# Patient Record
Sex: Male | Born: 1935 | Race: White | Hispanic: No | Smoking: Former smoker
Health system: Southern US, Community
[De-identification: ages and names within clinical notes are randomized; demographics above are authoritative.]

## PROBLEM LIST (undated history)

## (undated) DIAGNOSIS — M1612 Unilateral primary osteoarthritis, left hip: Secondary | ICD-10-CM

## (undated) DIAGNOSIS — L570 Actinic keratosis: Secondary | ICD-10-CM

## (undated) DIAGNOSIS — I480 Paroxysmal atrial fibrillation: Secondary | ICD-10-CM

## (undated) DIAGNOSIS — I313 Pericardial effusion (noninflammatory): Secondary | ICD-10-CM

## (undated) DIAGNOSIS — E785 Hyperlipidemia, unspecified: Secondary | ICD-10-CM

## (undated) DIAGNOSIS — I10 Essential (primary) hypertension: Secondary | ICD-10-CM

## (undated) DIAGNOSIS — R42 Dizziness and giddiness: Secondary | ICD-10-CM

## (undated) DIAGNOSIS — R918 Other nonspecific abnormal finding of lung field: Secondary | ICD-10-CM

## (undated) DIAGNOSIS — I77810 Thoracic aortic ectasia: Secondary | ICD-10-CM

## (undated) DIAGNOSIS — H409 Unspecified glaucoma: Secondary | ICD-10-CM

## (undated) DIAGNOSIS — J189 Pneumonia, unspecified organism: Secondary | ICD-10-CM

## (undated) DIAGNOSIS — N400 Enlarged prostate without lower urinary tract symptoms: Secondary | ICD-10-CM

## (undated) DIAGNOSIS — Z86010 Personal history of colonic polyps: Secondary | ICD-10-CM

## (undated) DIAGNOSIS — H269 Unspecified cataract: Secondary | ICD-10-CM

## (undated) DIAGNOSIS — Z860101 Personal history of adenomatous and serrated colon polyps: Secondary | ICD-10-CM

## (undated) DIAGNOSIS — M169 Osteoarthritis of hip, unspecified: Secondary | ICD-10-CM

## (undated) DIAGNOSIS — I350 Nonrheumatic aortic (valve) stenosis: Secondary | ICD-10-CM

## (undated) DIAGNOSIS — R202 Paresthesia of skin: Secondary | ICD-10-CM

## (undated) DIAGNOSIS — I341 Nonrheumatic mitral (valve) prolapse: Secondary | ICD-10-CM

## (undated) DIAGNOSIS — J309 Allergic rhinitis, unspecified: Secondary | ICD-10-CM

## (undated) DIAGNOSIS — I272 Pulmonary hypertension, unspecified: Secondary | ICD-10-CM

## (undated) DIAGNOSIS — Z889 Allergy status to unspecified drugs, medicaments and biological substances status: Secondary | ICD-10-CM

## (undated) DIAGNOSIS — N323 Diverticulum of bladder: Secondary | ICD-10-CM

## (undated) DIAGNOSIS — I5032 Chronic diastolic (congestive) heart failure: Secondary | ICD-10-CM

## (undated) DIAGNOSIS — R55 Syncope and collapse: Secondary | ICD-10-CM

## (undated) DIAGNOSIS — I2510 Atherosclerotic heart disease of native coronary artery without angina pectoris: Secondary | ICD-10-CM

## (undated) HISTORY — DX: Actinic keratosis: L57.0

## (undated) HISTORY — DX: Pericardial effusion (noninflammatory): I31.3

## (undated) HISTORY — DX: Allergy status to unspecified drugs, medicaments and biological substances: Z88.9

## (undated) HISTORY — DX: Unspecified glaucoma: H40.9

## (undated) HISTORY — DX: Pneumonia, unspecified organism: J18.9

## (undated) HISTORY — DX: Paresthesia of skin: R20.2

## (undated) HISTORY — DX: Unspecified cataract: H26.9

## (undated) HISTORY — DX: Pulmonary hypertension, unspecified: I27.20

## (undated) HISTORY — PX: COLONOSCOPY: SHX174

## (undated) HISTORY — DX: Personal history of adenomatous and serrated colon polyps: Z86.0101

## (undated) HISTORY — DX: Benign prostatic hyperplasia without lower urinary tract symptoms: N40.0

## (undated) HISTORY — DX: Chronic diastolic (congestive) heart failure: I50.32

## (undated) HISTORY — DX: Other nonspecific abnormal finding of lung field: R91.8

## (undated) HISTORY — DX: Syncope and collapse: R55

## (undated) HISTORY — DX: Unilateral primary osteoarthritis, left hip: M16.12

## (undated) HISTORY — DX: Personal history of colonic polyps: Z86.010

## (undated) HISTORY — DX: Nonrheumatic mitral (valve) prolapse: I34.1

## (undated) HISTORY — DX: Osteoarthritis of hip, unspecified: M16.9

## (undated) HISTORY — DX: Atherosclerotic heart disease of native coronary artery without angina pectoris: I25.10

## (undated) HISTORY — DX: Essential (primary) hypertension: I10

## (undated) HISTORY — DX: Dizziness and giddiness: R42

## (undated) HISTORY — DX: Thoracic aortic ectasia: I77.810

## (undated) HISTORY — DX: Nonrheumatic aortic (valve) stenosis: I35.0

## (undated) HISTORY — DX: Paroxysmal atrial fibrillation: I48.0

## (undated) HISTORY — DX: Allergic rhinitis, unspecified: J30.9

## (undated) HISTORY — DX: Hyperlipidemia, unspecified: E78.5

## (undated) HISTORY — PX: EYE SURGERY: SHX253

## (undated) HISTORY — DX: Diverticulum of bladder: N32.3

---
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
## 1999-06-13 ENCOUNTER — Encounter (INDEPENDENT_AMBULATORY_CARE_PROVIDER_SITE_OTHER): Payer: Self-pay

## 1999-06-13 ENCOUNTER — Other Ambulatory Visit: Admission: RE | Admit: 1999-06-13 | Discharge: 1999-06-13 | Payer: Self-pay | Admitting: Gastroenterology

## 2002-10-20 ENCOUNTER — Encounter: Payer: Self-pay | Admitting: Emergency Medicine

## 2002-10-20 ENCOUNTER — Emergency Department (HOSPITAL_COMMUNITY): Admission: EM | Admit: 2002-10-20 | Discharge: 2002-10-20 | Payer: Self-pay | Admitting: Emergency Medicine

## 2006-07-04 ENCOUNTER — Ambulatory Visit: Payer: Self-pay | Admitting: Internal Medicine

## 2006-08-06 ENCOUNTER — Ambulatory Visit: Payer: Self-pay | Admitting: Internal Medicine

## 2006-08-06 ENCOUNTER — Encounter: Payer: Self-pay | Admitting: Internal Medicine

## 2006-08-07 ENCOUNTER — Ambulatory Visit: Payer: Self-pay | Admitting: Internal Medicine

## 2006-08-07 LAB — CONVERTED CEMR LAB
Basophils Absolute: 0 10*3/uL (ref 0.0–0.1)
Basophils Relative: 0.1 % (ref 0.0–1.0)
Eosinophils Absolute: 0.5 10*3/uL (ref 0.0–0.6)
Eosinophils Relative: 7.2 % — ABNORMAL HIGH (ref 0.0–5.0)
HCT: 46.7 % (ref 39.0–52.0)
Hemoglobin: 16.1 g/dL (ref 13.0–17.0)
Lymphocytes Relative: 22.8 % (ref 12.0–46.0)
MCHC: 34.5 g/dL (ref 30.0–36.0)
MCV: 92.9 fL (ref 78.0–100.0)
Monocytes Absolute: 0.7 10*3/uL (ref 0.2–0.7)
Monocytes Relative: 10.3 % (ref 3.0–11.0)
Neutro Abs: 4.1 10*3/uL (ref 1.4–7.7)
Neutrophils Relative %: 59.6 % (ref 43.0–77.0)
Platelets: 183 10*3/uL (ref 150–400)
RBC: 5.02 M/uL (ref 4.22–5.81)
RDW: 12.9 % (ref 11.5–14.6)
WBC: 6.9 10*3/uL (ref 4.5–10.5)

## 2007-05-12 DIAGNOSIS — E785 Hyperlipidemia, unspecified: Secondary | ICD-10-CM

## 2007-05-12 DIAGNOSIS — K921 Melena: Secondary | ICD-10-CM

## 2007-05-12 DIAGNOSIS — K648 Other hemorrhoids: Secondary | ICD-10-CM | POA: Insufficient documentation

## 2007-05-12 DIAGNOSIS — Z86010 Personal history of colonic polyps: Secondary | ICD-10-CM

## 2010-05-29 NOTE — Assessment & Plan Note (Signed)
[name]                         GASTROENTEROLOGY OFFICE NOTE   NAME:[name] [name] [name] [name]                      MRN:          [id number]  DATE:07/04/2006                            DOB:          12-04-1935    CHIEF COMPLAINT:  Heme-positive stool.   ASSESSMENT:  1. Recent Hemoccult-positive stools.  2. Personal history of colon polyps.  Last colonoscopy with      adenomatous polyps in 2001.  Also had adenomatous polyps in 1999.      Previously seen by Dr. Corinda Gubler.   RECOMMENDATIONS/PLAN:  Proceed with colonoscopy to investigate the heme-  positive stools plus follow up colon polyps.  The risks, benefits and  indications were reviewed, and he understands and agrees to proceed.   The patient has no active symptoms.  He recently has what sounds like a  gastroenteritis with nausea and vomiting and diarrhea, which was self-  limited.  He was in systole at the time.   MEDICATIONS:  1. Pravastatin 40 mg daily.  2. Aspirin 325 mg daily.  3. Flonase p.r.n.   DRUG ALLERGIES:  None known.   PAST MEDICAL HISTORY:  1. Allergies.  2. Dyslipidemia.  3. Colon polyps (adenomatous).   SOCIAL HISTORY:  He is a Nurse, mental health, Ph.D, at [employer].  Married.  No children.  Lives with his wife.  Some alcohol.  No tobacco  or drugs.   FAMILY HISTORY:  Father had pancreatic cancer.   REVIEW OF SYSTEMS:  Otherwise negative.   PHYSICAL EXAMINATION:  GENERAL:  A well-developed and well-nourished  white man.  VITAL SIGNS:  Height 5 feet 9.  Weight 161 pounds.  Blood pressure  128/60, pulse 64.  HEENT:  Eyes are anicteric.  Normal mouth, lips, and pharynx.  NECK:  Supple.  No thyromegaly or mass.  CHEST:  Clear.  HEART:  S1 and S2.  No murmurs, rubs or gallops.  ABDOMEN:  Soft and nontender without organomegaly or mass.  EXTREMITIES:  No edema.  He is alert and oriented x3.   I appreciate the opportunity to care for this patient.     [name] Boop,  MD,FACG  Electronically Signed    CEG/MedQ  DD: 07/04/2006  DT: 07/05/2006  Job #: [id number]   cc:   [name] Maduro A. Nicholos [name], M.D.

## 2011-06-06 ENCOUNTER — Encounter: Payer: Self-pay | Admitting: Internal Medicine

## 2011-06-07 ENCOUNTER — Encounter: Payer: Self-pay | Admitting: Internal Medicine

## 2011-06-21 ENCOUNTER — Encounter: Payer: Self-pay | Admitting: Internal Medicine

## 2011-07-15 ENCOUNTER — Encounter: Payer: Self-pay | Admitting: Gastroenterology

## 2011-07-15 ENCOUNTER — Ambulatory Visit (AMBULATORY_SURGERY_CENTER): Payer: BC Managed Care – PPO | Admitting: *Deleted

## 2011-07-15 VITALS — Ht 69.0 in | Wt 150.7 lb

## 2011-07-15 DIAGNOSIS — Z1211 Encounter for screening for malignant neoplasm of colon: Secondary | ICD-10-CM

## 2011-07-15 MED ORDER — MOVIPREP 100 G PO SOLR: ORAL | Status: DC

## 2011-07-31 ENCOUNTER — Encounter: Payer: Self-pay | Admitting: Internal Medicine

## 2011-07-31 ENCOUNTER — Ambulatory Visit (AMBULATORY_SURGERY_CENTER): Payer: BC Managed Care – PPO | Admitting: Internal Medicine

## 2011-07-31 VITALS — BP 147/74 | HR 61 | Temp 97.9°F | Resp 18 | Ht 69.0 in | Wt 150.0 lb

## 2011-07-31 DIAGNOSIS — D126 Benign neoplasm of colon, unspecified: Secondary | ICD-10-CM

## 2011-07-31 DIAGNOSIS — K5730 Diverticulosis of large intestine without perforation or abscess without bleeding: Secondary | ICD-10-CM

## 2011-07-31 DIAGNOSIS — K648 Other hemorrhoids: Secondary | ICD-10-CM

## 2011-07-31 DIAGNOSIS — Z1211 Encounter for screening for malignant neoplasm of colon: Secondary | ICD-10-CM

## 2011-07-31 DIAGNOSIS — Z86010 Personal history of colonic polyps: Secondary | ICD-10-CM

## 2011-07-31 DIAGNOSIS — R011 Cardiac murmur, unspecified: Secondary | ICD-10-CM

## 2011-07-31 MED ORDER — SODIUM CHLORIDE 0.9 % IV SOLN: 500.0000 mL | INTRAVENOUS | Status: DC

## 2011-07-31 NOTE — Progress Notes (Signed)
Patient did not experience any of the following events: a burn prior to discharge; a fall within the facility; wrong site/side/patient/procedure/implant event; or a hospital transfer or hospital admission upon discharge from the facility. (G8907) Patient did not have preoperative order for IV antibiotic SSI prophylaxis. (G8918)  

## 2011-07-31 NOTE — Op Note (Signed)
[facility] [address] [place], [place]  [address]  COLONOSCOPY PROCEDURE REPORT  PATIENT:  [name], [name]  MR#:  [id number] BIRTHDATE:  [birth year], 75 yrs. old  GENDER:  male ENDOSCOPIST:  Iva Boop, MD, [facility]  PROCEDURE DATE:  07/31/2011 PROCEDURE:  Colonoscopy with snare polypectomy ASA CLASS:  Class II INDICATIONS:  surveillance and high-risk screening, history of pre-cancerous (adenomatous) colon polyps adenomas 1999, 2001 and 2008 (3 diminutive polyps) MEDICATIONS:   These medications were titrated to patient response per physician's verbal order, MAC sedation, administered by CRNA, propofol (Diprivan) 170 mg IV  DESCRIPTION OF PROCEDURE:   After the risks benefits and alternatives of the procedure were thoroughly explained, informed consent was obtained.  Digital rectal exam was performed and revealed no abnormalities and normal prostate.   The LB [id number]-H180AL [id number] endoscope was introduced through the anus and advanced to the cecum, which was identified by both the appendix and ileocecal valve, without limitations.  The quality of the prep was excellent, using MoviPrep.  The instrument was then slowly withdrawn as the colon was fully examined. <<PROCEDUREIMAGES>>  FINDINGS:  There were multiple polyps identified and removed. throughout the colon. Seven (7) polyps removed, cecum (1), ascending (3), transverse (2) and splenic flexure (1). Largest was 8-10 mm.  Moderate diverticulosis was found in the sigmoid colon. This was otherwise a normal examination of the colon. Includes right colon retroflexion.   Retroflexed views in the rectum revealed internal hemorrhoids.    The time to cecum = 2:26 minutes. The scope was then withdrawn in 18:03 minutes from the cecum and the procedure completed. COMPLICATIONS:  None ENDOSCOPIC IMPRESSION: 1) Polyps, multiple throughout the colon 2) Moderate diverticulosis in the sigmoid colon 3) Internal hemorrhoids 4)  Otherwise normal examination RECOMMENDATIONS: 1) Make an appointment with primary care MD (Dr. Nicholos Johns) to follow-up on heart murmur heard today. REPEAT EXAM:  In for Colonoscopy, pending biopsy results. likely 3 years  Iva Boop, MD, [facility]  CC:  Elias Else, MD and The Patient  n. eSIGNED:   Iva Boop at 07/31/2011 11:23 AM  [place] Mink, [phone]

## 2011-07-31 NOTE — Progress Notes (Addendum)
Colonoscopy report and information placed into a sealed envelope and given to pt at DC. Discharge information given to care partner and then care partner sent back to lobby

## 2011-07-31 NOTE — Patient Instructions (Addendum)
Seven (7) polyps were removed today. They look benign. You will probably need a repeat colonoscopy in 3 years. I will notify you after I see the pathology report.  You also have diverticulosis and internal hemorrhoids which are not usually a problem.  I also heard a heart murmur and recommend that you make an appointment with dr. Nicholos Johns to have that evaluated by him.  Thank you for choosing me and [facility].  Iva Boop, MD, FACG   YOU HAD AN ENDOSCOPIC PROCEDURE TODAY AT THE [facility]: Refer to the procedure report that was given to you for any specific questions about what was found during the examination.  If the procedure report does not answer your questions, please call your gastroenterologist to clarify.  If you requested that your care partner not be given the details of your procedure findings, then the procedure report has been included in a sealed envelope for you to review at your convenience later.  YOU SHOULD EXPECT: Some feelings of bloating in the abdomen. Passage of more gas than usual.  Walking can help get rid of the air that was put into your GI tract during the procedure and reduce the bloating. If you had a lower endoscopy (such as a colonoscopy or flexible sigmoidoscopy) you may notice spotting of blood in your stool or on the toilet paper. If you underwent a bowel prep for your procedure, then you may not have a normal bowel movement for a few days.  DIET: Your first meal following the procedure should be a light meal and then it is ok to progress to your normal diet.  A half-sandwich or bowl of soup is an example of a good first meal.  Heavy or fried foods are harder to digest and may make you feel nauseous or bloated.  Likewise meals heavy in dairy and vegetables can cause extra gas to form and this can also increase the bloating.  Drink plenty of fluids but you should avoid alcoholic beverages for 24 hours.  ACTIVITY: Your care partner  should take you home directly after the procedure.  You should plan to take it easy, moving slowly for the rest of the day.  You can resume normal activity the day after the procedure however you should NOT DRIVE or use heavy machinery for 24 hours (because of the sedation medicines used during the test).    SYMPTOMS TO REPORT IMMEDIATELY: A gastroenterologist can be reached at any hour.  During normal business hours, 8:30 AM to 5:00 PM Monday through Friday, call [phone].  After hours and on weekends, please call the GI answering service at [phone] who will take a message and have the physician on call contact you.   Following lower endoscopy (colonoscopy or flexible sigmoidoscopy):  Excessive amounts of blood in the stool  Significant tenderness or worsening of abdominal pains  Swelling of the abdomen that is new, acute  Fever of 100F or higher  FOLLOW UP: If any biopsies were taken you will be contacted by phone or by letter within the next 1-3 weeks.  Call your gastroenterologist if you have not heard about the biopsies in 3 weeks.  Our staff will call the home number listed on your records the next business day following your procedure to check on you and address any questions or concerns that you may have at that time regarding the information given to you following your procedure. This is a courtesy call and so if there is  no answer at the home number and we have not heard from you through the emergency physician on call, we will assume that you have returned to your regular daily activities without incident.  SIGNATURES/CONFIDENTIALITY: You and/or your care partner have signed paperwork which will be entered into your electronic medical record.  These signatures attest to the fact that that the information above on your After Visit Summary has been reviewed and is understood.  Full responsibility of the confidentiality of this discharge information lies with you and/or your  care-partner.

## 2011-07-31 NOTE — Progress Notes (Addendum)
popofol per [facility] crna, all meds to be titrated per crna during procedure. See scanned intra procedure report. ewm

## 2011-08-01 ENCOUNTER — Telehealth: Payer: Self-pay

## 2011-08-01 NOTE — Telephone Encounter (Signed)
Not available

## 2011-08-06 ENCOUNTER — Encounter: Payer: Self-pay | Admitting: Internal Medicine

## 2011-08-06 NOTE — Progress Notes (Signed)
Quick Note:  7 polyps largest 10 mm and 5 adenomas others hyperplastic  Repeat colon 3 years 07/2014 ______

## 2013-08-31 ENCOUNTER — Telehealth: Payer: Self-pay | Admitting: Cardiology

## 2013-08-31 ENCOUNTER — Other Ambulatory Visit: Payer: Self-pay | Admitting: General Surgery

## 2013-08-31 ENCOUNTER — Ambulatory Visit (HOSPITAL_COMMUNITY): Payer: BC Managed Care – PPO | Attending: Cardiology | Admitting: Radiology

## 2013-08-31 DIAGNOSIS — I77810 Thoracic aortic ectasia: Secondary | ICD-10-CM

## 2013-08-31 DIAGNOSIS — I359 Nonrheumatic aortic valve disorder, unspecified: Secondary | ICD-10-CM | POA: Diagnosis present

## 2013-08-31 DIAGNOSIS — I080 Rheumatic disorders of both mitral and aortic valves: Secondary | ICD-10-CM | POA: Insufficient documentation

## 2013-08-31 DIAGNOSIS — I350 Nonrheumatic aortic (valve) stenosis: Secondary | ICD-10-CM

## 2013-08-31 NOTE — Telephone Encounter (Signed)
Pt is aware. Made aware he will be new pt. Pt is waiting for scheduling to call him to reschedule.

## 2013-08-31 NOTE — Progress Notes (Signed)
Echocardiogram performed.  

## 2013-08-31 NOTE — Telephone Encounter (Signed)
Please have patient set up for a new patient visit to follow for abnormal echo with moderate AS and dilated aortic root

## 2013-09-01 ENCOUNTER — Encounter (HOSPITAL_COMMUNITY): Payer: Self-pay | Admitting: Cardiology

## 2013-09-09 ENCOUNTER — Encounter: Payer: Self-pay | Admitting: Cardiology

## 2013-09-09 ENCOUNTER — Encounter: Payer: Self-pay | Admitting: General Surgery

## 2013-09-09 ENCOUNTER — Ambulatory Visit (INDEPENDENT_AMBULATORY_CARE_PROVIDER_SITE_OTHER): Payer: BC Managed Care – PPO | Admitting: Cardiology

## 2013-09-09 VITALS — BP 134/82 | HR 59 | Ht 69.0 in | Wt 148.1 lb

## 2013-09-09 DIAGNOSIS — I359 Nonrheumatic aortic valve disorder, unspecified: Secondary | ICD-10-CM

## 2013-09-09 DIAGNOSIS — I059 Rheumatic mitral valve disease, unspecified: Secondary | ICD-10-CM

## 2013-09-09 DIAGNOSIS — I77810 Thoracic aortic ectasia: Secondary | ICD-10-CM

## 2013-09-09 DIAGNOSIS — Z8249 Family history of ischemic heart disease and other diseases of the circulatory system: Secondary | ICD-10-CM

## 2013-09-09 DIAGNOSIS — I350 Nonrheumatic aortic (valve) stenosis: Secondary | ICD-10-CM

## 2013-09-09 DIAGNOSIS — I341 Nonrheumatic mitral (valve) prolapse: Secondary | ICD-10-CM | POA: Insufficient documentation

## 2013-09-09 DIAGNOSIS — Q2381 Bicuspid aortic valve: Secondary | ICD-10-CM | POA: Insufficient documentation

## 2013-09-09 DIAGNOSIS — Q231 Congenital insufficiency of aortic valve: Secondary | ICD-10-CM | POA: Insufficient documentation

## 2013-09-09 NOTE — Patient Instructions (Addendum)
Your physician recommends that you continue on your current medications as directed. Please refer to the Current Medication list given to you today.  Your physician has requested that you have an exercise tolerance test. For further information please visit [email or web address]. Please also follow instruction sheet, as given.   Your physician wants you to follow-up in:12 months with Dr Mallie Snooks will receive a reminder letter in the mail two months in advance. If you don't receive a letter, please call our office to schedule the follow-up appointment.

## 2013-09-09 NOTE — Progress Notes (Signed)
[address] [place], [place]  [address] Phone: [phone] Fax:  [phone]  Date:  09/09/2013   ID:  [name] [name], DOB 08-15-1935, MRN [id number]  PCP:  [name], MD  Cardiologist:  Fransico Him, MD     History of Present Illness: [name] [name] is a 78 y.o. male with a history of dyslipidemia and family history of CAD with a brother who died at age 29 of an MI who presents today for evaluation of abnormal echo.  He has been followed with serial echos for bicuspid aortic valve with AS and mildly dilated aortic root.  His last echo showed normal LVF with moderate AS, mildly dilated aortic root at 72mm and mild MVP.  He is dong well.  He denies any chest pain, SOB, DOE (except when hiking up mountains), LE edema, dizziness, palpitations or syncope.   Wt Readings from Last 3 Encounters:  09/09/13 148 lb 1.9 oz (67.187 kg)  07/31/11 150 lb (68.04 kg)  07/15/11 150 lb 11.2 oz (68.357 kg)     Past Medical History  Diagnosis Date  . H/O seasonal allergies   . Bilateral cataracts   . Glaucoma   . Hyperlipidemia   . Allergic rhinitis   . Actinic keratoses     Dr Glee Arvin  . BPH (benign prostatic hypertrophy)   . Paresthesia     Chronic left lateral thigh  . Aortic valve stenosis, moderate   . Bicuspid aortic valve   . Dilated aortic root   . Mitral valve prolapse     Current Outpatient Prescriptions  Medication Sig Dispense Refill  . Ascorbic Acid (VITAMIN C) 1000 MG tablet Take 1,000 mg by mouth daily.      Marland Kitchen aspirin 81 MG tablet Take 81 mg by mouth daily.      . diphenhydrAMINE (SOMINEX) 25 MG tablet Take 25 mg by mouth at bedtime as needed.      . fish oil-omega-3 fatty acids 1000 MG capsule Take 1 g by mouth every other day.      . latanoprost (XALATAN) 0.005 % ophthalmic solution Place 1 drop into both eyes at bedtime.      . pravastatin (PRAVACHOL) 40 MG tablet Take 40 mg by mouth daily.       . timolol (BETIMOL) 0.25 % ophthalmic  solution 1-2 drops daily.       No current facility-administered medications for this visit.    Allergies:   No Known Allergies  Social History:  The patient  reports that he quit smoking about 53 years ago. He has never used smokeless tobacco. He reports that he drinks about 3 ounces of alcohol per week. He reports that he does not use illicit drugs.   Family History:  The patient's family history includes Emphysema in his mother; Heart attack in his brother; Heart disease in his brother; Pancreatic cancer in his father. There is no history of Colon cancer or Stomach cancer.   ROS:  Please see the history of present illness.      All other systems reviewed and negative.   PHYSICAL EXAM: VS:  BP 134/82  Pulse 59  Ht 5\' 9"  (1.753 m)  Wt 148 lb 1.9 oz (67.187 kg)  BMI 21.86 kg/m2  SpO2 99% Well nourished, well developed, in no acute distress HEENT: normal Neck: no JVD Cardiac:  normal S1, S2; RRR; 1/6 SM at RUSB, mid systolic click at apex with 2/6 SM Lungs:  clear to auscultation  bilaterally, no wheezing, rhonchi or rales Abd: soft, nontender, no hepatomegaly Ext: no edema Skin: warm and dry Neuro:  CNs 2-12 intact, no focal abnormalities noted   ASSESSMENT AND PLAN:  1. Bicuspid AV with moderate AS which is asymptomatic 2. Mild MVP with trivial MR 3. Mildly dilated aortic root 4. Family history of CAD with MI - ETT to rule out ischemia  Followup with me in 1 year  Signed, Fransico Him, MD 09/09/2013 11:50 AM

## 2013-09-11 ENCOUNTER — Encounter: Payer: Self-pay | Admitting: Cardiology

## 2013-10-14 ENCOUNTER — Ambulatory Visit (INDEPENDENT_AMBULATORY_CARE_PROVIDER_SITE_OTHER): Payer: BC Managed Care – PPO | Admitting: Physician Assistant

## 2013-10-14 DIAGNOSIS — R9439 Abnormal result of other cardiovascular function study: Secondary | ICD-10-CM

## 2013-10-14 DIAGNOSIS — Z8249 Family history of ischemic heart disease and other diseases of the circulatory system: Secondary | ICD-10-CM

## 2013-10-14 NOTE — Progress Notes (Signed)
Exercise Treadmill Test  Pre-Exercise Testing Evaluation Rhythm: normal sinus  Rate: 57 bpm     Test  Exercise Tolerance Test Ordering MD: Fransico Him, MD  Interpreting MD: Richardson Dopp, PA-C  Unique Test No: 1  Treadmill:  1  Indication for ETT: Family History of early CAD  Contraindication to ETT: No   Stress Modality: exercise - treadmill  Cardiac Imaging Performed: non   Protocol: standard Bruce - maximal  Max BP:  2220/109  Max MPHR (bpm):  145 85% MPR (bpm):  123  MPHR obtained (bpm):  106 % MPHR obtained:  73  Reached 85% MPHR (min:sec):  n/a Total Exercise Time (min-sec):  6:37  Workload in METS:  7.9 Borg Scale: 18  Reason ETT Terminated:  exaggerated hypertensive response/fatigue    ST Segment Analysis At Rest: non-specific ST segment slurring With Exercise: non-specific ST changes  Other Information Arrhythmia:  No Angina during ETT:  absent (0) Quality of ETT:  non-diagnostic  ETT Interpretation:  No Significant ST depression at Submaximal Exercise.  Exaggerated Hypertensive BP Response.    Comments: Fair exercise capacity. No chest pain. Exaggerated Hypertensive BP response to exercise (147/83>>>220/109). No significant ST changes to suggest ischemia with submaximal exercise.   Recommendations: With FHx, decreased exercise tolerance, exaggerated BP response >>> Will get [employer]. Signed,  Richardson Dopp, PA-C   10/14/2013 9:56 AM

## 2013-10-20 ENCOUNTER — Encounter: Payer: Self-pay | Admitting: Cardiology

## 2013-10-21 ENCOUNTER — Ambulatory Visit (HOSPITAL_COMMUNITY): Payer: BC Managed Care – PPO | Attending: Cardiology | Admitting: Radiology

## 2013-10-21 VITALS — BP 182/77 | HR 51 | Ht 69.0 in | Wt 149.0 lb

## 2013-10-21 DIAGNOSIS — Z8249 Family history of ischemic heart disease and other diseases of the circulatory system: Secondary | ICD-10-CM

## 2013-10-21 DIAGNOSIS — R9439 Abnormal result of other cardiovascular function study: Secondary | ICD-10-CM | POA: Diagnosis not present

## 2013-10-21 MED ORDER — REGADENOSON 0.4 MG/5ML IV SOLN
0.4000 mg | Freq: Once | INTRAVENOUS | Status: AC
  Administered 2013-10-21: 0.4 mg via INTRAVENOUS

## 2013-10-21 MED ORDER — TECHNETIUM TC 99M SESTAMIBI GENERIC - CARDIOLITE
30.0000 | Freq: Once | INTRAVENOUS | Status: AC | PRN
  Administered 2013-10-21: 30 via INTRAVENOUS

## 2013-10-21 MED ORDER — TECHNETIUM TC 99M SESTAMIBI GENERIC - CARDIOLITE
10.0000 | Freq: Once | INTRAVENOUS | Status: AC | PRN
  Administered 2013-10-21: 10 via INTRAVENOUS

## 2013-10-21 NOTE — Progress Notes (Signed)
[facility] [address] [place], [place] [address] [phone]    Cardiology Nuclear Med Study  IMRI LOR is a 78 y.o. male     MRN : [id number]     DOB: Jun 01, 1935  Procedure Date: 10/21/2013  Nuclear Med Background Indication for Stress Test:  Evaluation for Ischemia and Abnormal GXT History:  MPI ~90's Cardiac Risk Factors: N/A  Symptoms:  None indicated   Nuclear Pre-Procedure Caffeine/Decaff Intake:  None NPO After: 8:00pm   Lungs:  clear O2 Sat: 93% on room air. IV 0.9% NS with Angio Cath:  22g  IV Site: R Hand  IV Started by:  Crissie Figures, RN  Chest Size (in):  42 Cup Size: n/a  Height: 5\' 9"  (1.753 m)  Weight:  149 lb (67.586 kg)  BMI:  Body mass index is 21.99 kg/(m^2). Tech Comments:  N/A    Nuclear Med Study 1 or 2 day study: 1 day  Stress Test Type:  Lexiscan  Reading MD: N/A  Order Authorizing Provider:  Fransico Him, MD  Resting Radionuclide: Technetium 40m Sestamibi  Resting Radionuclide Dose: 11.0 mCi   Stress Radionuclide:  Technetium 33m Sestamibi  Stress Radionuclide Dose: 33.0 mCi           Stress Protocol Rest HR: 51 Stress HR: 91  Rest BP: 182/77 Stress BP: 129/68  Exercise Time (min): n/a METS: n/a           Dose of Adenosine (mg):  n/a Dose of Lexiscan: 0.4 mg  Dose of Atropine (mg): n/a Dose of Dobutamine: n/a mcg/kg/min (at max HR)  Stress Test Technologist: Glade Lloyd, BS-ES  Nuclear Technologist:  Margie Ege     Rest Procedure:  Myocardial perfusion imaging was performed at rest 45 minutes following the intravenous administration of Technetium 84m Sestamibi. Rest ECG: NSR - Normal EKG  Stress Procedure:  The patient received IV Lexiscan 0.4 mg over 15-seconds with concurrent low level exercise and then Technetium 69m Sestamibi was injected at 30-seconds while the patient continued walking one more minute.  Quantitative spect images were obtained after a 45-minute delay.  During the  infusion of Lexiscan the patient complained of fatigue, chest tightness and lightheadedness.  These symptoms slowly began to resolve in recovery.  Stress ECG: No significant change from baseline ECG  QPS Raw Data Images:  Normal; no motion artifact; normal heart/lung ratio. Stress Images:  Normal homogeneous uptake in all areas of the myocardium. Rest Images:  Normal homogeneous uptake in all areas of the myocardium. Subtraction (SDS):  No evidence of ischemia. Transient Ischemic Dilatation (Normal <1.22):  1.02 Lung/Heart Ratio (Normal <0.45):  0.27  Quantitative Gated Spect Images QGS EDV:  102 ml QGS ESV:  33 ml  Impression Exercise Capacity:  Lexiscan with low level exercise. BP Response:  Normal blood pressure response. Clinical Symptoms:  No significant symptoms noted. ECG Impression:  No significant ST segment change suggestive of ischemia. Comparison with Prior Nuclear Study: No images to compare  Overall Impression:  Normal stress nuclear study.  LV Ejection Fraction: 67%.  LV Wall Motion:  NL LV Function; NL Wall Motion.   Thayer Headings, Brooke Bonito., MD, [facility] 10/21/2013, 3:09 PM [address],  [place] Pager [phone]

## 2013-10-22 ENCOUNTER — Encounter: Payer: Self-pay | Admitting: Physician Assistant

## 2014-05-03 ENCOUNTER — Ambulatory Visit
Admission: RE | Admit: 2014-05-03 | Discharge: 2014-05-03 | Disposition: A | Discharge status: Home / Self Care | Payer: BC Managed Care – PPO | Source: Ambulatory Visit | Attending: Family Medicine | Admitting: Family Medicine

## 2014-05-03 ENCOUNTER — Other Ambulatory Visit: Payer: Self-pay | Admitting: Family Medicine

## 2014-05-03 DIAGNOSIS — M25552 Pain in left hip: Secondary | ICD-10-CM

## 2014-06-16 ENCOUNTER — Encounter: Payer: Self-pay | Admitting: Cardiology

## 2014-06-30 ENCOUNTER — Encounter: Payer: Self-pay | Admitting: Internal Medicine

## 2014-08-23 ENCOUNTER — Ambulatory Visit (AMBULATORY_SURGERY_CENTER): Payer: Self-pay

## 2014-08-23 VITALS — Ht 69.5 in | Wt 152.0 lb

## 2014-08-23 DIAGNOSIS — Z860100 Personal history of colon polyps, unspecified: Secondary | ICD-10-CM

## 2014-08-23 NOTE — Progress Notes (Signed)
No allergies to eggs or soy No diet/weight loss meds No home oxygen No past problems with anesthesia  Refused Emmi 

## 2014-08-30 ENCOUNTER — Encounter: Payer: Self-pay | Admitting: Internal Medicine

## 2014-08-30 ENCOUNTER — Ambulatory Visit (AMBULATORY_SURGERY_CENTER): Payer: BC Managed Care – PPO | Admitting: Internal Medicine

## 2014-08-30 VITALS — BP 136/66 | HR 58 | Temp 96.4°F | Resp 39 | Ht 69.0 in | Wt 152.0 lb

## 2014-08-30 DIAGNOSIS — D122 Benign neoplasm of ascending colon: Secondary | ICD-10-CM

## 2014-08-30 DIAGNOSIS — Z86010 Personal history of colonic polyps: Secondary | ICD-10-CM | POA: Diagnosis not present

## 2014-08-30 DIAGNOSIS — D124 Benign neoplasm of descending colon: Secondary | ICD-10-CM

## 2014-08-30 MED ORDER — SODIUM CHLORIDE 0.9 % IV SOLN: 500.0000 mL | INTRAVENOUS | Status: DC

## 2014-08-30 NOTE — Patient Instructions (Addendum)
I found and removed 3 small polyps that look benign. I will let you know pathology results and when/if to have another routine colonoscopy by mail.  I appreciate the opportunity to care for you. Gatha Mayer, MD, FACG  YOU HAD AN ENDOSCOPIC PROCEDURE TODAY AT [facility]:   Refer to the procedure report that was given to you for any specific questions about what was found during the examination.  If the procedure report does not answer your questions, please call your gastroenterologist to clarify.  If you requested that your care partner not be given the details of your procedure findings, then the procedure report has been included in a sealed envelope for you to review at your convenience later.  YOU SHOULD EXPECT: Some feelings of bloating in the abdomen. Passage of more gas than usual.  Walking can help get rid of the air that was put into your GI tract during the procedure and reduce the bloating. If you had a lower endoscopy (such as a colonoscopy or flexible sigmoidoscopy) you may notice spotting of blood in your stool or on the toilet paper. If you underwent a bowel prep for your procedure, you may not have a normal bowel movement for a few days.  Please Note:  You might notice some irritation and congestion in your nose or some drainage.  This is from the oxygen used during your procedure.  There is no need for concern and it should clear up in a day or so.  SYMPTOMS TO REPORT IMMEDIATELY:   Following lower endoscopy (colonoscopy or flexible sigmoidoscopy):  Excessive amounts of blood in the stool  Significant tenderness or worsening of abdominal pains  Swelling of the abdomen that is new, acute  Fever of 100F or higher   For urgent or emergent issues, a gastroenterologist can be reached at any hour by calling [phone].   DIET: Your first meal following the procedure should be a small meal and then it is ok to progress to your normal diet. Heavy  or fried foods are harder to digest and may make you feel nauseous or bloated.  Likewise, meals heavy in dairy and vegetables can increase bloating.  Drink plenty of fluids but you should avoid alcoholic beverages for 24 hours.  ACTIVITY:  You should plan to take it easy for the rest of today and you should NOT DRIVE or use heavy machinery until tomorrow (because of the sedation medicines used during the test).    FOLLOW UP: Our staff will call the number listed on your records the next business day following your procedure to check on you and address any questions or concerns that you may have regarding the information given to you following your procedure. If we do not reach you, we will leave a message.  However, if you are feeling well and you are not experiencing any problems, there is no need to return our call.  We will assume that you have returned to your regular daily activities without incident.  If any biopsies were taken you will be contacted by phone or by letter within the next 1-3 weeks.  Please call us at [phone] if you have not heard about the biopsies in 3 weeks.    SIGNATURES/CONFIDENTIALITY: You and/or your care partner have signed paperwork which will be entered into your electronic medical record.  These signatures attest to the fact that that the information above on your After Visit Summary has been reviewed and is  understood.  Full responsibility of the confidentiality of this discharge information lies with you and/or your care-partner.  Polyp information given.

## 2014-08-30 NOTE — Op Note (Signed)
[place]  [address] [place], [id number]   COLONOSCOPY PROCEDURE REPORT  PATIENT: [name] [name], [name] [name]  MR#: [id number] BIRTHDATE: [birth year] , 71  yrs. old GENDER: male ENDOSCOPIST: Gatha Mayer, MD, [facility] PROCEDURE DATE:  08/30/2014 PROCEDURE:   Colonoscopy, surveillance and Colonoscopy with snare polypectomy First Screening Colonoscopy - Avg.  risk and is 50 yrs.  old or older - No.  Prior Negative Screening - Now for repeat screening. N/A  History of Adenoma - Now for follow-up colonoscopy & has been > or = to 3 yrs.  Yes hx of adenoma.  Has been 3 or more years since last colonoscopy.  Polyps removed today? Yes ASA CLASS:   Class I INDICATIONS:Surveillance due to prior colonic neoplasia and PH Colon Adenoma. MEDICATIONS: Propofol 175 mg IV and Monitored anesthesia care  DESCRIPTION OF PROCEDURE:   After the risks benefits and alternatives of the procedure were thoroughly explained, informed consent was obtained.  The digital rectal exam revealed no abnormalities of the rectum and revealed no prostatic nodules. The [id number]  endoscope was introduced through the anus and advanced to the cecum, which was identified by both the appendix and ileocecal valve. No adverse events experienced.   The quality of the prep was good.  (MiraLax was used)  The instrument was then slowly withdrawn as the colon was fully examined. Estimated blood loss is zero unless otherwise noted in this procedure report.  COLON FINDINGS: Three sessile polyps ranging from 3 to 39mm in size were found in the ascending colon and descending colon. Polypectomies were performed with a cold snare.  The resection was complete, the polyp tissue was completely retrieved and sent to histology.   There was diverticulosis noted in the sigmoid colon. The examination was otherwise normal.  Retroflexed views revealed no abnormalities. The time to cecum = 3.4 Withdrawal time = 14.0 The scope  was withdrawn and the procedure completed. COMPLICATIONS: There were no immediate complications.  ENDOSCOPIC IMPRESSION: 1.   Three sessile polyps ranging from 3 to 21mm in size were found in the ascending colon and descending colon; polypectomies were performed with a cold snare 2.   Diverticulosis was noted in the sigmoid colon 3.   The examination was otherwise normal - good prep  RECOMMENDATIONS: Timing of repeat colonoscopy will be determined by pathology findings.  he has hx adenomas over the years but age may preclude furher routine colonoscopy.  eSigned:  Gatha Mayer, MD, [facility] 08/30/2014 2:39 PM   cc: Dr. Maury Dus and The Patient

## 2014-08-30 NOTE — Progress Notes (Signed)
Transferred to recovery room. A/O x3, pleased with MAC.  VSS.  Report to Jane, RN. 

## 2014-08-30 NOTE — Progress Notes (Signed)
Called to room to assist during endoscopic procedure.  Patient ID and intended procedure confirmed with present staff. Received instructions for my participation in the procedure from the performing physician.  

## 2014-08-31 ENCOUNTER — Telehealth: Payer: Self-pay | Admitting: *Deleted

## 2014-08-31 NOTE — Telephone Encounter (Signed)
  No answer at home or cell #.  Left message on cell phone voicemail.

## 2014-09-06 ENCOUNTER — Encounter: Payer: Self-pay | Admitting: Internal Medicine

## 2014-09-06 DIAGNOSIS — Z86010 Personal history of colonic polyps: Secondary | ICD-10-CM

## 2014-09-06 NOTE — Progress Notes (Signed)
Quick Note:  1 diminutive adenoma - would not repeat routine colonoscopy due to age ______

## 2014-09-12 ENCOUNTER — Other Ambulatory Visit: Payer: Self-pay | Admitting: Cardiology

## 2014-09-12 DIAGNOSIS — I77810 Thoracic aortic ectasia: Secondary | ICD-10-CM

## 2014-09-13 ENCOUNTER — Other Ambulatory Visit: Payer: Self-pay

## 2014-09-13 ENCOUNTER — Ambulatory Visit (HOSPITAL_COMMUNITY): Payer: BC Managed Care – PPO | Attending: Cardiology

## 2014-09-13 DIAGNOSIS — I272 Other secondary pulmonary hypertension: Secondary | ICD-10-CM | POA: Insufficient documentation

## 2014-09-13 DIAGNOSIS — I77810 Thoracic aortic ectasia: Secondary | ICD-10-CM | POA: Diagnosis not present

## 2014-09-13 DIAGNOSIS — I071 Rheumatic tricuspid insufficiency: Secondary | ICD-10-CM | POA: Diagnosis not present

## 2014-09-13 DIAGNOSIS — I313 Pericardial effusion (noninflammatory): Secondary | ICD-10-CM | POA: Insufficient documentation

## 2014-09-13 DIAGNOSIS — E785 Hyperlipidemia, unspecified: Secondary | ICD-10-CM | POA: Insufficient documentation

## 2014-09-13 DIAGNOSIS — I352 Nonrheumatic aortic (valve) stenosis with insufficiency: Secondary | ICD-10-CM | POA: Diagnosis not present

## 2014-09-13 DIAGNOSIS — Z8249 Family history of ischemic heart disease and other diseases of the circulatory system: Secondary | ICD-10-CM | POA: Diagnosis not present

## 2014-09-14 ENCOUNTER — Telehealth: Payer: Self-pay | Admitting: Cardiology

## 2014-09-14 ENCOUNTER — Telehealth: Payer: Self-pay

## 2014-09-14 DIAGNOSIS — I341 Nonrheumatic mitral (valve) prolapse: Secondary | ICD-10-CM

## 2014-09-14 DIAGNOSIS — Q231 Congenital insufficiency of aortic valve: Secondary | ICD-10-CM

## 2014-09-14 DIAGNOSIS — I350 Nonrheumatic aortic (valve) stenosis: Secondary | ICD-10-CM

## 2014-09-14 DIAGNOSIS — I77810 Thoracic aortic ectasia: Secondary | ICD-10-CM

## 2014-09-14 NOTE — Telephone Encounter (Signed)
Encouraged patient to keep appointment since he has known cardiac issues.  Patient agrees with treatment plan.

## 2014-09-14 NOTE — Telephone Encounter (Signed)
Informed patient of results and verbal understanding expressed.  Repeat ECHO ordered to be scheduled in 1 year. Patient agrees with treatment plan. 

## 2014-09-14 NOTE — Telephone Encounter (Signed)
New Message  Pt requested to speak w/ RN about upcoming appt- pt stated that per previous conversation w/ Valetta Fuller- pt condition is stable. Pt did not know if this appt on 9/6 was necessary. Please call back and discuss.

## 2014-09-14 NOTE — Telephone Encounter (Signed)
-----   Message from Sueanne Margarita, MD sent at 09/13/2014 10:57 AM EDT ----- Please let patient know that echo showed low normal LVF with bicuspid AV and mild to moderate AS, moderate TR - no significant change from a year ago - repeat echo in 1 year.

## 2014-09-19 NOTE — Progress Notes (Signed)
Cardiology Office Note   Date:  09/20/2014   ID:  [name] [name], DOB [birth year], MRN [id number]  PCP:  [name], MD    Chief Complaint  Patient presents with  . Bicuspid aortic valve      History of Present Illness: [name] [name] is a 79 y.o. male with a history of dyslipidemia and family history of CAD, bicuspid aortic valve with moderate AS and mildly dilated aortic root and mild MVP. He is dong well. He denies any chest pain, SOB, DOE (except when hiking up mountains), LE edema, dizziness, palpitations or syncope.    Past Medical History  Diagnosis Date  . H/O seasonal allergies   . Bilateral cataracts   . Glaucoma   . Hyperlipidemia   . Allergic rhinitis   . Actinic keratoses     Dr Glee Arvin  . BPH (benign prostatic hypertrophy)   . Paresthesia     Chronic left lateral thigh  . Aortic valve stenosis, moderate   . Bicuspid aortic valve   . Dilated aortic root   . Mitral valve prolapse   . Hx of cardiovascular stress test     Myoview (10/15):  normal  . Hx of adenomatous colonic polyps 1991 on    Past Surgical History  Procedure Laterality Date  . Tonsillectomy  1949  . Colonoscopy  multiple     Current Outpatient Prescriptions  Medication Sig Dispense Refill  . Ascorbic Acid (VITAMIN C) 1000 MG tablet Take 1,000 mg by mouth daily.    Marland Kitchen aspirin 81 MG tablet Take 81 mg by mouth daily.    . fish oil-omega-3 fatty acids 1000 MG capsule Take 1 g by mouth daily.     Marland Kitchen latanoprost (XALATAN) 0.005 % ophthalmic solution Place 1 drop into both eyes at bedtime.    . pravastatin (PRAVACHOL) 40 MG tablet Take 40 mg by mouth daily.     . timolol (BETIMOL) 0.25 % ophthalmic solution 1-2 drops daily.     No current facility-administered medications for this visit.    Allergies:   Review of patient's allergies indicates no known allergies.    Social History:  The patient  reports that he quit smoking about 54 years ago. He  has never used smokeless tobacco. He reports that he drinks about 3.0 oz of alcohol per week. He reports that he does not use illicit drugs.   Family History:  The patient's family history includes Emphysema in his mother; Heart attack in his brother; Heart disease in his brother; Pancreatic cancer in his father. There is no history of Colon cancer or Stomach cancer.    ROS:  Please see the history of present illness.   Otherwise, review of systems are positive for none.   All other systems are reviewed and negative.    PHYSICAL EXAM: VS:  BP 104/68 mmHg  Pulse 61  Ht 5\' 9"  (1.753 m)  Wt 148 lb 12.8 oz (67.495 kg)  BMI 21.96 kg/m2 , BMI Body mass index is 21.96 kg/(m^2). GEN: Well nourished, well developed, in no acute distress HEENT: normal Neck: no JVD, carotid bruits, or masses Cardiac: RRR; no rubs, or gallops,no edema.  1/6 SM at RUSB Respiratory:  clear to auscultation bilaterally, normal work of breathing GI: soft, nontender, nondistended, + BS MS: no deformity or atrophy Skin: warm and dry, no rash Neuro:  Strength and sensation are intact Psych:  euthymic mood, full affect   EKG:  EKG was ordered today and showed NSR with LAE and LAFB    Recent Labs: No results found for requested labs within last 365 days.    Lipid Panel No results found for: CHOL, TRIG, HDL, CHOLHDL, VLDL, LDLCALC, LDLDIRECT    Wt Readings from Last 3 Encounters:  09/20/14 148 lb 12.8 oz (67.495 kg)  08/30/14 152 lb (68.947 kg)  08/23/14 152 lb (68.947 kg)     ASSESSMENT AND PLAN:  1. Bicuspid AV with mild to moderate AS by echo 08/2014 which is asymptomatic- repeat echo in 1 year 2. Mild MVP with trivial MR 3. Mildly dilated aortic root 4. Family history of CAD with MI    Current medicines are reviewed at length with the patient today.  The patient does not have concerns regarding medicines.  The following changes have been made:  no change  Labs/ tests ordered today: See above  Assessment and Plan No orders of the defined types were placed in this encounter.     Disposition:   FU with me in 1 year  Signed, Sueanne Margarita, MD  09/20/2014 3:17 PM    [facility] [place], [place], [place]  [address] Phone: [phone]; Fax: [phone]

## 2014-09-20 ENCOUNTER — Encounter: Payer: Self-pay | Admitting: Cardiology

## 2014-09-20 ENCOUNTER — Ambulatory Visit (INDEPENDENT_AMBULATORY_CARE_PROVIDER_SITE_OTHER): Payer: BC Managed Care – PPO | Admitting: Cardiology

## 2014-09-20 VITALS — BP 104/68 | HR 61 | Ht 69.0 in | Wt 148.8 lb

## 2014-09-20 DIAGNOSIS — I341 Nonrheumatic mitral (valve) prolapse: Secondary | ICD-10-CM | POA: Diagnosis not present

## 2014-09-20 DIAGNOSIS — Q231 Congenital insufficiency of aortic valve: Secondary | ICD-10-CM | POA: Diagnosis not present

## 2014-09-20 DIAGNOSIS — I350 Nonrheumatic aortic (valve) stenosis: Secondary | ICD-10-CM | POA: Diagnosis not present

## 2014-09-20 DIAGNOSIS — I77810 Thoracic aortic ectasia: Secondary | ICD-10-CM

## 2014-09-20 NOTE — Patient Instructions (Signed)
Medication Instructions:  Your physician recommends that you continue on your current medications as directed. Please refer to the Current Medication list given to you today.   Labwork: None  Testing/Procedures: Your physician has requested that you have an echocardiogram in August, 2017. Echocardiography is a painless test that uses sound waves to create images of your heart. It provides your doctor with information about the size and shape of your heart and how well your heart's chambers and valves are working. This procedure takes approximately one hour. There are no restrictions for this procedure.  Follow-Up: Your physician wants you to follow-up in: 1 year with Dr. Radford Pax. You will receive a reminder letter in the mail two months in advance. If you don't receive a letter, please call our office to schedule the follow-up appointment.   Any Other Special Instructions Will Be Listed Below (If Applicable).

## 2014-09-21 ENCOUNTER — Telehealth (HOSPITAL_COMMUNITY): Payer: Self-pay | Admitting: *Deleted

## 2014-10-06 ENCOUNTER — Ambulatory Visit (INDEPENDENT_AMBULATORY_CARE_PROVIDER_SITE_OTHER): Payer: BC Managed Care – PPO

## 2014-10-06 ENCOUNTER — Encounter: Payer: Self-pay | Admitting: Family Medicine

## 2014-10-06 ENCOUNTER — Encounter: Payer: BC Managed Care – PPO | Admitting: Sports Medicine

## 2014-10-06 ENCOUNTER — Ambulatory Visit (INDEPENDENT_AMBULATORY_CARE_PROVIDER_SITE_OTHER): Payer: BC Managed Care – PPO | Admitting: Family Medicine

## 2014-10-06 VITALS — BP 167/79 | HR 58 | Wt 145.0 lb

## 2014-10-06 DIAGNOSIS — I70201 Unspecified atherosclerosis of native arteries of extremities, right leg: Secondary | ICD-10-CM | POA: Diagnosis not present

## 2014-10-06 DIAGNOSIS — M25562 Pain in left knee: Secondary | ICD-10-CM | POA: Diagnosis not present

## 2014-10-06 NOTE — Patient Instructions (Addendum)
Thank you for coming in today. Call or go to the ER if you develop a large red swollen joint with extreme pain or oozing puss.  Return in 3-4 weeks.  Knee Pain The knee is the complex joint between your thigh and your lower leg. It is made up of bones, tendons, ligaments, and cartilage. The bones that make up the knee are:  The femur in the thigh.  The tibia and fibula in the lower leg.  The patella or kneecap riding in the groove on the lower femur. CAUSES  Knee pain is a common complaint with many causes. A few of these causes are:  Injury, such as:  A ruptured ligament or tendon injury.  Torn cartilage.  Medical conditions, such as:  Gout  Arthritis  Infections  Overuse, over training, or overdoing a physical activity. Knee pain can be minor or severe. Knee pain can accompany debilitating injury. Minor knee problems often respond well to self-care measures or get well on their own. More serious injuries may need medical intervention or even surgery. SYMPTOMS The knee is complex. Symptoms of knee problems can vary widely. Some of the problems are:  Pain with movement and weight bearing.  Swelling and tenderness.  Buckling of the knee.  Inability to straighten or extend your knee.  Your knee locks and you cannot straighten it.  Warmth and redness with pain and fever.  Deformity or dislocation of the kneecap. DIAGNOSIS  Determining what is wrong may be very straight forward such as when there is an injury. It can also be challenging because of the complexity of the knee. Tests to make a diagnosis may include:  Your caregiver taking a history and doing a physical exam.  Routine X-rays can be used to rule out other problems. X-rays will not reveal a cartilage tear. Some injuries of the knee can be diagnosed by:  Arthroscopy a surgical technique by which a small video camera is inserted through tiny incisions on the sides of the knee. This procedure is used to  examine and repair internal knee joint problems. Tiny instruments can be used during arthroscopy to repair the torn knee cartilage (meniscus).  Arthrography is a radiology technique. A contrast liquid is directly injected into the knee joint. Internal structures of the knee joint then become visible on X-ray film.  An MRI scan is a non X-ray radiology procedure in which magnetic fields and a computer produce two- or three-dimensional images of the inside of the knee. Cartilage tears are often visible using an MRI scanner. MRI scans have largely replaced arthrography in diagnosing cartilage tears of the knee.  Blood work.  Examination of the fluid that helps to lubricate the knee joint (synovial fluid). This is done by taking a sample out using a needle and a syringe. TREATMENT The treatment of knee problems depends on the cause. Some of these treatments are:  Depending on the injury, proper casting, splinting, surgery, or physical therapy care will be needed.  Give yourself adequate recovery time. Do not overuse your joints. If you begin to get sore during workout routines, back off. Slow down or do fewer repetitions.  For repetitive activities such as cycling or running, maintain your strength and nutrition.  Alternate muscle groups. For example, if you are a weight lifter, work the upper body on one day and the lower body the next.  Either tight or weak muscles do not give the proper support for your knee. Tight or weak muscles do not absorb  the stress placed on the knee joint. Keep the muscles surrounding the knee strong.  Take care of mechanical problems.  If you have flat feet, orthotics or special shoes may help. See your caregiver if you need help.  Arch supports, sometimes with wedges on the inner or outer aspect of the heel, can help. These can shift pressure away from the side of the knee most bothered by osteoarthritis.  A brace called an "unloader" brace also may be used to  help ease the pressure on the most arthritic side of the knee.  If your caregiver has prescribed crutches, braces, wraps or ice, use as directed. The acronym for this is PRICE. This means protection, rest, ice, compression, and elevation.  Nonsteroidal anti-inflammatory drugs (NSAIDs), can help relieve pain. But if taken immediately after an injury, they may actually increase swelling. Take NSAIDs with food in your stomach. Stop them if you develop stomach problems. Do not take these if you have a history of ulcers, stomach pain, or bleeding from the bowel. Do not take without your caregiver's approval if you have problems with fluid retention, heart failure, or kidney problems.  For ongoing knee problems, physical therapy may be helpful.  Glucosamine and chondroitin are over-the-counter dietary supplements. Both may help relieve the pain of osteoarthritis in the knee. These medicines are different from the usual anti-inflammatory drugs. Glucosamine may decrease the rate of cartilage destruction.  Injections of a corticosteroid drug into your knee joint may help reduce the symptoms of an arthritis flare-up. They may provide pain relief that lasts a few months. You may have to wait a few months between injections. The injections do have a small increased risk of infection, water retention, and elevated blood sugar levels.  Hyaluronic acid injected into damaged joints may ease pain and provide lubrication. These injections may work by reducing inflammation. A series of shots may give relief for as long as 6 months.  Topical painkillers. Applying certain ointments to your skin may help relieve the pain and stiffness of osteoarthritis. Ask your pharmacist for suggestions. Many over the-counter products are approved for temporary relief of arthritis pain.  In some countries, doctors often prescribe topical NSAIDs for relief of chronic conditions such as arthritis and tendinitis. A review of treatment with  NSAID creams found that they worked as well as oral medications but without the serious side effects. PREVENTION  Maintain a healthy weight. Extra pounds put more strain on your joints.  Get strong, stay limber. Weak muscles are a common cause of knee injuries. Stretching is important. Include flexibility exercises in your workouts.  Be smart about exercise. If you have osteoarthritis, chronic knee pain or recurring injuries, you may need to change the way you exercise. This does not mean you have to stop being active. If your knees ache after jogging or playing basketball, consider switching to swimming, water aerobics, or other low-impact activities, at least for a few days a week. Sometimes limiting high-impact activities will provide relief.  Make sure your shoes fit well. Choose footwear that is right for your sport.  Protect your knees. Use the proper gear for knee-sensitive activities. Use kneepads when playing volleyball or laying carpet. Buckle your seat belt every time you drive. Most shattered kneecaps occur in car accidents.  Rest when you are tired. SEEK MEDICAL CARE IF:  You have knee pain that is continual and does not seem to be getting better.  SEEK IMMEDIATE MEDICAL CARE IF:  Your knee joint feels hot  to the touch and you have a high fever. MAKE SURE YOU:   Understand these instructions.  Will watch your condition.  Will get help right away if you are not doing well or get worse. Document Released: 10/28/2006 Document Revised: 03/25/2011 Document Reviewed: 10/28/2006 [facility] Patient Information 2015 [place], [place]. This information is not intended to replace advice given to you by your health care provider. Make sure you discuss any questions you have with your health care provider.

## 2014-10-06 NOTE — Assessment & Plan Note (Signed)
Likely related to DJD. Patient had significant improvement with injection. Return in 3-4 weeks. Follow-up with PCP regarding elevated blood pressure.

## 2014-10-06 NOTE — Progress Notes (Signed)
   Subjective:    I'm seeing this patient as a consultation for:  Dr. Chucky May  CC: Left knee pain  HPI: Patient notes a 4 month history of left anterior hip pain and left knee pain. He was seen initially in April at his primary care office where x-rays were performed showing mild hip OA bilaterally. He did some physical therapy for what sounds like a hip adductor strain. He notes this helped his hip pain some however he notes he continues to have persistent knee pain. He notes left lateral knee pain initially with some pain and swelling continuing. The pain is worse with activity. He denies significant radiating pain weakness or numbness fevers or chills. He denies that he's had any injections or x-rays of his knee yet. He takes nabumetone twice daily for pain which helps a little.  Past medical history, Surgical history, Family history not pertinant except as noted below, Social history, Allergies, and medications have been entered into the medical record, reviewed, and no changes needed.   Review of Systems: No headache, visual changes, nausea, vomiting, diarrhea, constipation, dizziness, abdominal pain, skin rash, fevers, chills, night sweats, weight loss, swollen lymph nodes, body aches, joint swelling, muscle aches, chest pain, shortness of breath, mood changes, visual or auditory hallucinations.   Objective:    Filed Vitals:   10/06/14 1043  BP: 167/79  Pulse: 58   General: Well Developed, well nourished, and in no acute distress.  Neuro/Psych: Alert and oriented x3, extra-ocular muscles intact, able to move all 4 extremities, sensation grossly intact. Skin: Warm and dry, no rashes noted.  Respiratory: Not using accessory muscles, speaking in full sentences, trachea midline.  Cardiovascular: Pulses palpable, no extremity edema. Abdomen: Does not appear distended. MSK: Hips bilaterally are nontender. Normal motion. No impingement. Left knee normal-appearing without effusion. Full range  of motion with 1+ retropatellar crepitations. Mildly tender to palpation left joint line. Stable ligamentous exam negative McMurray's test. Right knee normal-appearing nontender for range of motion 1+ retropatellar crepitations. Stable ligamentous exam. Capillary refill sensation are intact bilateral lower extremities  Procedure: Real-time Ultrasound Guided Injection of left knee  Device: GE Logiq E  Images permanently stored and available for review in the ultrasound unit. Verbal informed consent obtained. Discussed risks and benefits of procedure. Warned about infection bleeding damage to structures skin hypopigmentation and fat atrophy among others. Patient expresses understanding and agreement Time-out conducted.  Noted no overlying erythema, induration, or other signs of local infection.  Skin prepped in a sterile fashion.  Local anesthesia: Topical Ethyl chloride.  With sterile technique and under real time ultrasound guidance: 40 mg of Kenalog and 4 mL of Marcaine injected easily.  Completed without difficulty  Pain immediately resolved suggesting accurate placement of the medication.  Advised to call if fevers/chills, erythema, induration, drainage, or persistent bleeding.  Images permanently stored and available for review in the ultrasound unit.  Impression: Technically successful ultrasound guided injection.  Preliminary read of left knee x-ray weightbearing shows moderate OA. Awaiting formal radiology read  No results found for this or any previous visit (from the past 24 hour(s)). No results found.  Impression and Recommendations:   This case required medical decision making of moderate complexity.

## 2014-10-07 NOTE — Progress Notes (Signed)
Quick Note:  Xray shows some arthritis ______

## 2014-11-03 ENCOUNTER — Ambulatory Visit (INDEPENDENT_AMBULATORY_CARE_PROVIDER_SITE_OTHER): Payer: BC Managed Care – PPO | Admitting: Family Medicine

## 2014-11-03 ENCOUNTER — Encounter: Payer: Self-pay | Admitting: Family Medicine

## 2014-11-03 VITALS — BP 150/52 | HR 53 | Wt 143.0 lb

## 2014-11-03 DIAGNOSIS — R1030 Lower abdominal pain, unspecified: Secondary | ICD-10-CM | POA: Diagnosis not present

## 2014-11-03 DIAGNOSIS — R030 Elevated blood-pressure reading, without diagnosis of hypertension: Secondary | ICD-10-CM | POA: Diagnosis not present

## 2014-11-03 DIAGNOSIS — M1612 Unilateral primary osteoarthritis, left hip: Secondary | ICD-10-CM

## 2014-11-03 DIAGNOSIS — M25562 Pain in left knee: Secondary | ICD-10-CM

## 2014-11-03 DIAGNOSIS — R1032 Left lower quadrant pain: Secondary | ICD-10-CM

## 2014-11-03 DIAGNOSIS — IMO0001 Reserved for inherently not codable concepts without codable children: Secondary | ICD-10-CM

## 2014-11-03 HISTORY — DX: Unilateral primary osteoarthritis, left hip: M16.12

## 2014-11-03 NOTE — Patient Instructions (Signed)
Thank you for coming in today. Return to PT for your hip.  Return in 1 month.  We will find out how expensive the gel shots are for you.  Your blood pressure is elevated again. Follow up with Dr. Chucky May.

## 2014-11-03 NOTE — Assessment & Plan Note (Signed)
Continues to be elevated but is not symptomatic. Follow-up with PCP for this issue.

## 2014-11-03 NOTE — Assessment & Plan Note (Signed)
Hip adductor irritation versus impingement. Plan for repeat physical therapy. Return one month.

## 2014-11-03 NOTE — Assessment & Plan Note (Signed)
Likely related to DJD. Less than ideal improvement with steroid injection. Discussed viscous supplementation. Will investigate cost. Attend physical therapy. Return in one month

## 2014-11-03 NOTE — Progress Notes (Signed)
[name] [name] is a 79 y.o. male who presents to [facility]  today for follow-up knee pain. Patient was seen last month for left knee pain. He was thought to have pain related to mild osteoarthritis. He received a corticosteroid injection into the left knee and feels moderately improved. He still notes pain especially with activity. He denies any fevers or chills nausea vomiting or diarrhea.  Patient does note continued left groin pain. He suffered what sounds like a tear to the hip adductors in April 2016. He received physical therapy which helped a lot. He still has mild anterior hip pain radiating to the anterior thigh especially worse with activity such as rising from a seated position and getting in and out of car. He does some exercises but is now doing most of the home therapy exercises.  Of note additionally at the last visit patient had elevated blood pressure. If still elevated. He denies any chest pains or palpitations shortness of breath.   Past Medical History  Diagnosis Date  . H/O seasonal allergies   . Bilateral cataracts   . Glaucoma   . Hyperlipidemia   . Allergic rhinitis   . Actinic keratoses     Dr Glee Arvin  . BPH (benign prostatic hypertrophy)   . Paresthesia     Chronic left lateral thigh  . Aortic valve stenosis, moderate   . Bicuspid aortic valve   . Dilated aortic root ([place])   . Mitral valve prolapse   . Hx of cardiovascular stress test     Myoview (10/15):  normal  . Hx of adenomatous colonic polyps 1991 on   Past Surgical History  Procedure Laterality Date  . Tonsillectomy  1949  . Colonoscopy  multiple   Social History  Substance Use Topics  . Smoking status: Former Smoker    Quit date: 07/14/1960  . Smokeless tobacco: Never Used  . Alcohol Use: 3.0 oz/week    5 Cans of beer per week   family history includes Emphysema in his mother; Heart attack in his brother; Heart disease in his brother; Pancreatic  cancer in his father. There is no history of Colon cancer or Stomach cancer.  ROS as above Medications: Current Outpatient Prescriptions  Medication Sig Dispense Refill  . Ascorbic Acid (VITAMIN C) 1000 MG tablet Take 1,000 mg by mouth daily.    Marland Kitchen aspirin 81 MG tablet Take 81 mg by mouth daily.    . fish oil-omega-3 fatty acids 1000 MG capsule Take 1 g by mouth daily.     Marland Kitchen latanoprost (XALATAN) 0.005 % ophthalmic solution Place 1 drop into both eyes at bedtime.    . pravastatin (PRAVACHOL) 40 MG tablet Take 40 mg by mouth daily.     . timolol (BETIMOL) 0.25 % ophthalmic solution 1-2 drops daily.     No current facility-administered medications for this visit.   No Known Allergies   Exam:  BP 150/52 mmHg  Pulse 53  Wt 143 lb (64.864 kg) Gen: Well NAD  Abd: NABS, Soft. Nondistended, Nontender Exts: Brisk capillary refill, warm and well perfused.  Left knee normal-appearing nontender normal motion  Left hip nontender limited internal rotation range of motion. Mild pain with internal rotation range of motion tested. Normal gait  No results found for this or any previous visit (from the past 24 hour(s)). No results found.   Please see individual assessment and plan sections.

## 2014-12-01 ENCOUNTER — Encounter: Payer: Self-pay | Admitting: Family Medicine

## 2014-12-01 ENCOUNTER — Ambulatory Visit (INDEPENDENT_AMBULATORY_CARE_PROVIDER_SITE_OTHER): Payer: BC Managed Care – PPO | Admitting: Family Medicine

## 2014-12-01 VITALS — BP 153/72 | HR 53 | Wt 144.0 lb

## 2014-12-01 DIAGNOSIS — R1032 Left lower quadrant pain: Secondary | ICD-10-CM

## 2014-12-01 DIAGNOSIS — M25562 Pain in left knee: Secondary | ICD-10-CM | POA: Diagnosis not present

## 2014-12-01 DIAGNOSIS — R1030 Lower abdominal pain, unspecified: Secondary | ICD-10-CM

## 2014-12-01 NOTE — Patient Instructions (Signed)
Thank you for coming in today. Return in the new year for visco-suplimentation injection.  Your insurance will cover the use of Euflexxa brand. Do some research and see if this is something he would consider. I do believe it will help a lot. Consider getting a body helix knee sleeve.

## 2014-12-01 NOTE — Assessment & Plan Note (Signed)
Improving. Return in the new year for consideration of discussed supplementation injection series.

## 2014-12-01 NOTE — Assessment & Plan Note (Signed)
Improving with physical therapy.  

## 2014-12-01 NOTE — Progress Notes (Signed)
[name] [name] is a 79 y.o. male who presents to [facility]  today for follow-up left knee and hip pain. He's been to physical therapy and has had 1 left knee injection. He notes that he is improving considerably. He continues to be interested and viscous supplementation.   Past Medical History  Diagnosis Date  . H/O seasonal allergies   . Bilateral cataracts   . Glaucoma   . Hyperlipidemia   . Allergic rhinitis   . Actinic keratoses     Dr Glee Arvin  . BPH (benign prostatic hypertrophy)   . Paresthesia     Chronic left lateral thigh  . Aortic valve stenosis, moderate   . Bicuspid aortic valve   . Dilated aortic root ([place])   . Mitral valve prolapse   . Hx of cardiovascular stress test     Myoview (10/15):  normal  . Hx of adenomatous colonic polyps 1991 on   Past Surgical History  Procedure Laterality Date  . Tonsillectomy  1949  . Colonoscopy  multiple   Social History  Substance Use Topics  . Smoking status: Former Smoker    Quit date: 07/14/1960  . Smokeless tobacco: Never Used  . Alcohol Use: 3.0 oz/week    5 Cans of beer per week   family history includes Emphysema in his mother; Heart attack in his brother; Heart disease in his brother; Pancreatic cancer in his father. There is no history of Colon cancer or Stomach cancer.  ROS as above Medications: Current Outpatient Prescriptions  Medication Sig Dispense Refill  . Ascorbic Acid (VITAMIN C) 1000 MG tablet Take 1,000 mg by mouth daily.    Marland Kitchen aspirin 81 MG tablet Take 81 mg by mouth daily.    . fish oil-omega-3 fatty acids 1000 MG capsule Take 1 g by mouth daily.     Marland Kitchen glucosamine-chondroitin 500-400 MG tablet Take 1 tablet by mouth 3 (three) times daily.    Marland Kitchen latanoprost (XALATAN) 0.005 % ophthalmic solution Place 1 drop into both eyes at bedtime.    . pravastatin (PRAVACHOL) 40 MG tablet Take 40 mg by mouth daily.     . timolol (BETIMOL) 0.25 % ophthalmic solution 1-2  drops daily.     No current facility-administered medications for this visit.   No Known Allergies   Exam:  BP 153/72 mmHg  Pulse 53  Wt 144 lb (65.318 kg) Gen: Well NAD Left knee is normal appearing with normal motion. Normal gait   No results found for this or any previous visit (from the past 24 hour(s)). No results found.   Please see individual assessment and plan sections.

## 2015-08-03 ENCOUNTER — Encounter: Payer: Self-pay | Admitting: Family Medicine

## 2015-08-03 ENCOUNTER — Ambulatory Visit (INDEPENDENT_AMBULATORY_CARE_PROVIDER_SITE_OTHER): Payer: BC Managed Care – PPO | Admitting: Family Medicine

## 2015-08-03 VITALS — BP 135/72 | HR 58 | Wt 147.0 lb

## 2015-08-03 DIAGNOSIS — M25562 Pain in left knee: Secondary | ICD-10-CM | POA: Diagnosis not present

## 2015-08-03 NOTE — Progress Notes (Signed)
[name] [name] is a 80 y.o. male who presents to [facility] [place] today for follow-up left knee pain. Patient was seen several months ago for left knee pain attributable to moderate to mild DJD. He's had steroid injections and really didn't help much. He's noted in the interim he's had some atrophy of his left quadriceps and is a big concern. Additionally he notes that he is limping in his gait is a bit unstable. Previously had pretty good results with physical therapy for a prior hip muscle strain.  He is interested and stem cell therapy for his knee pain and would like to know more about it.   Past Medical History  Diagnosis Date  . H/O seasonal allergies   . Bilateral cataracts   . Glaucoma   . Hyperlipidemia   . Allergic rhinitis   . Actinic keratoses     Dr Glee Arvin  . BPH (benign prostatic hypertrophy)   . Paresthesia     Chronic left lateral thigh  . Aortic valve stenosis, moderate   . Bicuspid aortic valve   . Dilated aortic root ([place])   . Mitral valve prolapse   . Hx of cardiovascular stress test     Myoview (10/15):  normal  . Hx of adenomatous colonic polyps 1991 on   Past Surgical History  Procedure Laterality Date  . Tonsillectomy  1949  . Colonoscopy  multiple   Social History  Substance Use Topics  . Smoking status: Former Smoker    Quit date: 07/14/1960  . Smokeless tobacco: Never Used  . Alcohol Use: 3.0 oz/week    5 Cans of beer per week   family history includes Emphysema in his mother; Heart attack in his brother; Heart disease in his brother; Pancreatic cancer in his father. There is no history of Colon cancer or Stomach cancer.  ROS as above:  Medications: Current Outpatient Prescriptions  Medication Sig Dispense Refill  . Ascorbic Acid (VITAMIN C) 1000 MG tablet Take 1,000 mg by mouth daily.    Marland Kitchen aspirin 81 MG tablet Take 81  mg by mouth daily.    . fish oil-omega-3 fatty acids 1000 MG capsule Take 1 g by mouth daily.     Marland Kitchen glucosamine-chondroitin 500-400 MG tablet Take 1 tablet by mouth 3 (three) times daily.    Marland Kitchen latanoprost (XALATAN) 0.005 % ophthalmic solution Place 1 drop into both eyes at bedtime.    Marland Kitchen losartan (COZAAR) 100 MG tablet Take 100 mg by mouth daily.    . meloxicam (MOBIC) 15 MG tablet Take 15 mg by mouth daily.    . pravastatin (PRAVACHOL) 40 MG tablet Take 40 mg by mouth daily.     . timolol (BETIMOL) 0.25 % ophthalmic solution 1-2 drops daily.     No current facility-administered medications for this visit.   No Known Allergies   Exam:  BP 135/72 mmHg  Pulse 58  Wt 147 lb (66.679 kg) Gen: Well NAD Left knee: Normal-appearing with exception of mild left quadrant atrophy compared to right. No effusion. Nontender. Full motion however patient has 2+ crepitations with extension. Stable ligamentous exam. Antalgic gait.  X-ray left knee and left hip reviewed from 2016   No results found for this or any previous visit (from the past 24 hour(s)). No results found.    Assessment and Plan: 80 y.o. 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  I suspect that if we were to obtain an MRI would have more significant DJD that is apparent on x-ray. However the treatment would not change at this time based on MRI results therefore we'll not do an MRI right now.  Plan to resume physical therapy as I think this would certainly help his quadriceps strength and gait. Discussed injection options including Hyaluronic acid viscous supplementation which I think is his best option and platelet rich plasma injections which I think is going to be pretty marginal. We spent several minutes discussing the pros and cons of mesenchymal stem cell therapy. At this time the current understanding is that this is probably not helpful and possibly dangerous. I  recommend against mesenchymal stem cell therapy with the current status regarding as it is.   return as needed. Recommend Hyaluronic acid injections.    Discussed warning signs or symptoms. Please see discharge instructions. Patient expresses understanding.   CC: [name], MD

## 2015-08-03 NOTE — Patient Instructions (Signed)
Thank you for coming in today. Re do physical therapy.  Return in a few months.  Consider Euflexxa and platelet rich plasma injections.  I'm dubious about stem cell therapy at this time.

## 2015-08-14 ENCOUNTER — Ambulatory Visit (INDEPENDENT_AMBULATORY_CARE_PROVIDER_SITE_OTHER): Payer: BC Managed Care – PPO | Admitting: Family Medicine

## 2015-08-14 ENCOUNTER — Ambulatory Visit (INDEPENDENT_AMBULATORY_CARE_PROVIDER_SITE_OTHER): Payer: BC Managed Care – PPO

## 2015-08-14 ENCOUNTER — Encounter: Payer: Self-pay | Admitting: Family Medicine

## 2015-08-14 VITALS — BP 156/78 | HR 54 | Wt 149.0 lb

## 2015-08-14 DIAGNOSIS — M1612 Unilateral primary osteoarthritis, left hip: Secondary | ICD-10-CM

## 2015-08-14 DIAGNOSIS — M79641 Pain in right hand: Secondary | ICD-10-CM

## 2015-08-14 DIAGNOSIS — M1990 Unspecified osteoarthritis, unspecified site: Secondary | ICD-10-CM | POA: Diagnosis not present

## 2015-08-14 DIAGNOSIS — M79642 Pain in left hand: Secondary | ICD-10-CM | POA: Diagnosis not present

## 2015-08-14 MED ORDER — DICLOFENAC SODIUM 1 % TD GEL: 2.0000 g | Freq: Four times a day (QID) | TRANSDERMAL | 11 refills | Status: DC

## 2015-08-14 NOTE — Progress Notes (Signed)
[name] [name] is a 80 y.o. male who presents to [facility] [place] today for hand pain and hip pain.  Hip pain: Patient notes continued left hip pain. He's been evaluated for this several times and did well with physical therapy. He had x-rays in April 2016 that did not show much arthritis. In the interim with any managing knee pain as well. However he had repeat x-rays at an outside radiology facility that showed significant DJD of the left femoral acetabular joint. He notes continued moderate to severe left anterior hip pain worse with activity better with rest.  Hand pain: Patient has bilateral hand and wrist pain and swelling. This is been ongoing for some time. He notes he had some hand swelling and was given a steroid Dosepak to work. The pain has returned. He notes pain at the base of the thumb and into the fifth MCP. He has history of injury to the third PIPs bilaterally due to old basketball injuries. These are not particularly painful today.   Past Medical History:  Diagnosis Date  . Actinic keratoses    Dr Glee Arvin  . Allergic rhinitis   . Aortic valve stenosis, moderate   . Bicuspid aortic valve   . Bilateral cataracts   . BPH (benign prostatic hypertrophy)   . Dilated aortic root ([place])   . Glaucoma   . H/O seasonal allergies   . Hx of adenomatous colonic polyps 1991 on  . Hx of cardiovascular stress test    Myoview (10/15):  normal  . Hyperlipidemia   . Mitral valve prolapse   . Paresthesia    Chronic left lateral thigh   Past Surgical History:  Procedure Laterality Date  . COLONOSCOPY  multiple  . TONSILLECTOMY  1949   Social History  Substance Use Topics  . Smoking status: Former Smoker    Quit date: 07/14/1960  . Smokeless tobacco: Never Used  . Alcohol use 3.0 oz/week    5 Cans of beer per week   family history includes Emphysema in his  mother; Heart attack in his brother; Heart disease in his brother; Pancreatic cancer in his father.  ROS as above:  Medications: Current Outpatient Prescriptions  Medication Sig Dispense Refill  . Ascorbic Acid (VITAMIN C) 1000 MG tablet Take 1,000 mg by mouth daily.    Marland Kitchen aspirin 81 MG tablet Take 81 mg by mouth daily.    . fish oil-omega-3 fatty acids 1000 MG capsule Take 1 g by mouth daily.     Marland Kitchen glucosamine-chondroitin 500-400 MG tablet Take 1 tablet by mouth 3 (three) times daily.    Marland Kitchen latanoprost (XALATAN) 0.005 % ophthalmic solution Place 1 drop into both eyes at bedtime.    Marland Kitchen losartan (COZAAR) 100 MG tablet Take 100 mg by mouth daily.    . meloxicam (MOBIC) 15 MG tablet Take 15 mg by mouth daily.    . pravastatin (PRAVACHOL) 40 MG tablet Take 40 mg by mouth daily.     . timolol (BETIMOL) 0.25 % ophthalmic solution 1-2 drops daily.     No current facility-administered medications for this visit.    No Known Allergies   Exam:  BP (!) 156/78   Pulse (!) 54   Wt 149 lb (67.6 kg)   BMI 22.00 kg/m  Gen: Well NAD HEENT: EOMI,  MMM Lungs: Normal work of breathing. CTABL Heart: RRR no MRG Abd: NABS, Soft. Nondistended, Nontender Exts: Brisk capillary refill,  warm and well perfused.  Hands bilaterally show swollen slightly ulnar deviated third PIPs bilaterally. Otherwise he has mild diffuse synovitis of the MCPs bilaterally with no ulnar deviation. Normal grip and motion. Nontender. His capillary refill and sensation are intact. Left hip normal-appearing nontender decreased motion due to pain. Painful gait.  Review of outside radiology imaging shows severe DJD of the left femoral acetabular joint.  Procedure: Real-time Ultrasound Guided Injection of left hip  Device: GE Logiq E  Images permanently stored and available for review in the ultrasound unit. Verbal informed consent obtained. Discussed risks and benefits of procedure. Warned about infection bleeding damage to  structures skin hypopigmentation and fat atrophy among others. Patient expresses understanding and agreement Time-out conducted.  Noted no overlying erythema, induration, or other signs of local infection.  Skin prepped in a sterile fashion.  Local anesthesia: Topical Ethyl chloride.  With sterile technique and under real time ultrasound guidance: 80 mg of Kenalog and 4 mL of Marcaine injected easily.  Completed without difficulty  Pain immediately resolved suggesting accurate placement of the medication.  Advised to call if fevers/chills, erythema, induration, drainage, or persistent bleeding.  Images permanently stored and available for review in the ultrasound unit.  Impression: Technically successful ultrasound guided injection.    No results found for this or any previous visit (from the past 24 hour(s)). No results found.    Assessment and Plan: 80 y.o. male with  1) left hip pain: Very likely due to DJD. Injection today had significant benefit. Discussed hip replacement options. Return as needed  2) hand pain: Likely DJD versus synovitis. X-rays pending. Treatment with Voltaren gel as needed. Return as needed.   Orders Placed This Encounter  Procedures  . DG Hand Complete Left    Order Specific Question:   Reason for Exam (SYMPTOM  OR DIAGNOSIS REQUIRED)    Answer:   BL hand pain suspect DJD    Order Specific Question:   Preferred imaging location?    Answer:   [facility]  . DG Hand Complete Right    Order Specific Question:   Reason for Exam (SYMPTOM  OR DIAGNOSIS REQUIRED)    Answer:   Bl hand pain suspect DJD    Order Specific Question:   Preferred imaging location?    Answer:   [facility]    Discussed warning signs or symptoms. Please see discharge instructions. Patient expresses understanding.  CC: [name], MD

## 2015-08-14 NOTE — Patient Instructions (Signed)
Thank you for coming in today. Return as needed.  Call or go to the ER if you develop a large red swollen joint with extreme pain or oozing puss.  Consider discussing hip replacement.   Dr Mayer Camel and Dr Wynelle Link come recommended.    Total Hip Replacement Total hip replacement is a surgical procedure to remove damaged bone in your hip joint and replace it with an artificial hip joint (prosthetic hip joint). The purpose of this surgery is to reduce pain and to improve your hip function.  During a total hip replacement, one or both parts of the hip joint are replaced, depending on the type of joint damage you have. The hip is a ball-and-socket type of joint, and it has two main parts. The ball part of the joint (femoral head) is the top of the thigh bone (femur). The socket part of the joint is a large indent in the side of your pelvis (acetabulum) where the femur and pelvis meet. LET [facility] CARE PROVIDER KNOW ABOUT:  Any allergies you have.  All medicines you are taking, including vitamins, herbs, eye drops, creams, and over-the-counter medicines.  Previous problems you or members of your family have had with the use of anesthetics.  Any blood disorders you have.  Previous surgeries you have had.  Medical conditions you have. RISKS AND COMPLICATIONS  Generally, total hip replacement is a safe procedure. However, problems can occur, including:  Infection.  Dislocation (the ball of the hip-joint prosthesis comes out of contact with the socket).  Loosening of the piece (stem) that connects the prosthetic femoral head to the femur.  Fracture of the bone while inserting the prosthesis.  Formation of blood clots, which can break loose and travel to and injure your lungs (pulmonary embolus). BEFORE THE PROCEDURE   Plan to have someone take you home after the procedure.  Do not eat or drink anything after midnight on the night before the procedure or as directed by your health care  provider.  Ask your health care provider about:  Changing or stopping your regular medicines. This is especially important if you are taking diabetes medicines or blood thinners.  Taking medicines such as aspirin and ibuprofen. These medicines can thin your blood. Do not take these medicines before your procedure if your health care provider asks you not to.  Ask your health care provider about how your surgical site will be marked or identified.  You may be given antibiotic medicines to help prevent infection. PROCEDURE   To reduce your risk of infection:  Your health care team will wash or sanitize their hands.  Your skin will be washed with soap.  An IV tube will be inserted into one of your veins. You will be given one or more of the following:  A medicine that makes you drowsy (sedative).  A medicine that makes you fall asleep (general anesthetic).  A medicine injected into your spine that numbs your body below the waist (spinal anesthetic).  An incision will be made in your hip. Your surgeon will take out any damaged cartilage and bone.  Your surgeon will then:  Insert a prosthetic socket into the acetabulum of your pelvis. This is usually secured with screws.  Remove the femoral head and replace it with a prosthetic ball and stem secured into the top of your femur.  Place the ball into the socket and check the range of motion and stability of your new hip.  Close the incision and apply  a bandage over the surgical site. AFTER THE PROCEDURE   You will stay in a recovery area until the medicines have worn off.  Your vital signs, such as your pulse and blood pressure, will be monitored.  Once you are awake and stable, you will be taken to a hospital room.  You may be directed to take actions to help prevent blood clots. These may include:  Walking soon after surgery, with someone assisting you. Moving around after surgery helps to improve blood flow.  Taking  medicines to thin your blood (anticoagulants).  Wearing compression stockings or using different types of devices.  You will receive physical therapy until you are doing well and your health care provider feels it is safe for you to go home.   This information is not intended to replace advice given to you by your health care provider. Make sure you discuss any questions you have with your health care provider.   Document Released: 04/08/2000 Document Revised: 09/21/2014 Document Reviewed: 03/03/2013 Elsevier Interactive Patient Education [employer].

## 2015-09-12 ENCOUNTER — Telehealth: Payer: Self-pay | Admitting: *Deleted

## 2015-09-12 NOTE — Telephone Encounter (Signed)
Patient states he received a call regarding the time of his echo appt---arrive @ 2:45?  Please call

## 2015-09-14 ENCOUNTER — Ambulatory Visit (HOSPITAL_COMMUNITY): Payer: BC Managed Care – PPO | Attending: Cardiology

## 2015-09-14 ENCOUNTER — Other Ambulatory Visit: Payer: Self-pay

## 2015-09-14 DIAGNOSIS — Q231 Congenital insufficiency of aortic valve: Secondary | ICD-10-CM | POA: Insufficient documentation

## 2015-09-14 DIAGNOSIS — I352 Nonrheumatic aortic (valve) stenosis with insufficiency: Secondary | ICD-10-CM | POA: Insufficient documentation

## 2015-09-14 DIAGNOSIS — I350 Nonrheumatic aortic (valve) stenosis: Secondary | ICD-10-CM | POA: Diagnosis present

## 2015-09-14 DIAGNOSIS — Z8249 Family history of ischemic heart disease and other diseases of the circulatory system: Secondary | ICD-10-CM | POA: Diagnosis not present

## 2015-09-14 DIAGNOSIS — I77810 Thoracic aortic ectasia: Secondary | ICD-10-CM | POA: Diagnosis not present

## 2015-09-14 DIAGNOSIS — I341 Nonrheumatic mitral (valve) prolapse: Secondary | ICD-10-CM | POA: Insufficient documentation

## 2015-09-14 DIAGNOSIS — I517 Cardiomegaly: Secondary | ICD-10-CM | POA: Insufficient documentation

## 2015-09-14 DIAGNOSIS — Z87891 Personal history of nicotine dependence: Secondary | ICD-10-CM | POA: Diagnosis not present

## 2015-09-14 DIAGNOSIS — I059 Rheumatic mitral valve disease, unspecified: Secondary | ICD-10-CM | POA: Insufficient documentation

## 2015-09-14 DIAGNOSIS — E785 Hyperlipidemia, unspecified: Secondary | ICD-10-CM | POA: Diagnosis not present

## 2015-09-21 ENCOUNTER — Ambulatory Visit (INDEPENDENT_AMBULATORY_CARE_PROVIDER_SITE_OTHER): Payer: BC Managed Care – PPO | Admitting: Cardiology

## 2015-09-21 ENCOUNTER — Encounter: Payer: Self-pay | Admitting: Cardiology

## 2015-09-21 VITALS — BP 112/58 | HR 60 | Ht 69.0 in | Wt 147.0 lb

## 2015-09-21 DIAGNOSIS — I350 Nonrheumatic aortic (valve) stenosis: Secondary | ICD-10-CM | POA: Diagnosis not present

## 2015-09-21 DIAGNOSIS — I77810 Thoracic aortic ectasia: Secondary | ICD-10-CM

## 2015-09-21 DIAGNOSIS — I10 Essential (primary) hypertension: Secondary | ICD-10-CM | POA: Diagnosis not present

## 2015-09-21 DIAGNOSIS — Z8249 Family history of ischemic heart disease and other diseases of the circulatory system: Secondary | ICD-10-CM | POA: Diagnosis not present

## 2015-09-21 DIAGNOSIS — Q231 Congenital insufficiency of aortic valve: Secondary | ICD-10-CM | POA: Diagnosis not present

## 2015-09-21 LAB — BASIC METABOLIC PANEL
BUN: 18 mg/dL (ref 7–25)
CHLORIDE: 94 mmol/L — AB (ref 98–110)
CO2: 28 mmol/L (ref 20–31)
Calcium: 9.4 mg/dL (ref 8.6–10.3)
Creat: 0.74 mg/dL (ref 0.70–1.18)
Glucose, Bld: 100 mg/dL — ABNORMAL HIGH (ref 65–99)
POTASSIUM: 4.8 mmol/L (ref 3.5–5.3)
SODIUM: 130 mmol/L — AB (ref 135–146)

## 2015-09-21 NOTE — Progress Notes (Signed)
Cardiology Office Note    Date:  09/21/2015   ID:  [name], DOB 1935-06-19, MRN [id number]  PCP:  [name], MD  Cardiologist:  Fransico Him, MD   Chief Complaint  Patient presents with  . Aortic Stenosis  . Hypertension    History of Present Illness:  [name] [name] is a 80 y.o. male with a history of dyslipidemia and family history of CAD, bicuspid aortic valve with moderate AS and mildly dilated aortic root and mild MVP. He is dong well. He denies any chest pain, SOB, DOE (he has not been very active due to knee pain), LE edema, dizziness, palpitations or syncope.   Past Medical History:  Diagnosis Date  . Actinic keratoses    Dr Glee Arvin  . Allergic rhinitis   . Aortic valve stenosis, moderate   . Benign essential HTN   . Bicuspid aortic valve   . Bilateral cataracts   . BPH (benign prostatic hypertrophy)   . Dilated aortic root ([place])    28mm by echo 08/2015  . Glaucoma   . H/O seasonal allergies   . Hx of adenomatous colonic polyps 1991 on  . Hx of cardiovascular stress test    Myoview (10/15):  normal  . Hyperlipidemia   . Mitral valve prolapse    mild MR by echo 08/2015  . Paresthesia    Chronic left lateral thigh    Past Surgical History:  Procedure Laterality Date  . COLONOSCOPY  multiple  . TONSILLECTOMY  1949    Current Medications: Outpatient Medications Prior to Visit  Medication Sig Dispense Refill  . Ascorbic Acid (VITAMIN C) 1000 MG tablet Take 1,000 mg by mouth daily.    Marland Kitchen aspirin 81 MG tablet Take 81 mg by mouth daily.    . fish oil-omega-3 fatty acids 1000 MG capsule Take 1 g by mouth daily.     Marland Kitchen glucosamine-chondroitin 500-400 MG tablet Take 1 tablet by mouth 3 (three) times daily.    Marland Kitchen latanoprost (XALATAN) 0.005 % ophthalmic solution Place 1 drop into both eyes at bedtime.    Marland Kitchen losartan (COZAAR) 100 MG tablet Take 100 mg by mouth daily.    . pravastatin (PRAVACHOL) 40 MG tablet Take 40 mg by mouth daily.       . timolol (BETIMOL) 0.25 % ophthalmic solution 1-2 drops daily.    . diclofenac sodium (VOLTAREN) 1 % GEL Apply 2 g topically 4 (four) times daily. To affected joint. (Patient not taking: Reported on 09/21/2015) 100 g 11  . meloxicam (MOBIC) 15 MG tablet Take 15 mg by mouth daily.     No facility-administered medications prior to visit.      Allergies:   Review of patient's allergies indicates no known allergies.   Social History   Social History  . Marital status: Married    Spouse name: N/A  . Number of children: N/A  . Years of education: N/A   Social History Main Topics  . Smoking status: Former Smoker    Quit date: 07/14/1960  . Smokeless tobacco: Never Used  . Alcohol use 3.0 oz/week    5 Cans of beer per week  . Drug use: No  . Sexual activity: Not Asked   Other Topics Concern  . None   Social History Narrative  . None     Family History:  The patient's family history includes Emphysema in his mother; Heart attack in his brother; Heart disease in his brother; Pancreatic cancer in  his father.   ROS:   Please see the history of present illness.    ROS All other systems reviewed and are negative.   PHYSICAL EXAM:   VS:  BP (!) 112/58   Pulse 60   Ht 5\' 9"  (1.753 m)   Wt 147 lb (66.7 kg)   BMI 21.71 kg/m    GEN: Well nourished, well developed, in no acute distress  HEENT: normal  Neck: no JVD, carotid bruits, or masses Cardiac: RRR; no murmurs, rubs, or gallops,no edema.  Intact distal pulses bilaterally.  Respiratory:  clear to auscultation bilaterally, normal work of breathing GI: soft, nontender, nondistended, + BS MS: no deformity or atrophy  Skin: warm and dry, no rash Neuro:  Alert and Oriented x 3, Strength and sensation are intact Psych: euthymic mood, full affect  Wt Readings from Last 3 Encounters:  09/21/15 147 lb (66.7 kg)  08/14/15 149 lb (67.6 kg)  08/03/15 147 lb (66.7 kg)      Studies/Labs Reviewed:   EKG:  EKG is not ordered today  and showed NSR at 60bpm with LAFB and LVH by voltage  Recent Labs: No results found for requested labs within last 8760 hours.   Lipid Panel No results found for: CHOL, TRIG, HDL, CHOLHDL, VLDL, LDLCALC, LDLDIRECT  Additional studies/ records that were reviewed today include:  none    ASSESSMENT:    1. Bicuspid aortic valve   2. Aortic stenosis, moderate   3. Dilated aortic root ([place])   4. Family history of premature CAD   5. Benign essential HTN      PLAN:  In order of problems listed above:  1. Bicuspid AV - I have recommended that if he has any children, that they have a screening echo and Chest CT to rule out bicuspid AV and aortic aneursym but he has no children.  2. Moderate AS - he is asymptomatic.  Will repeat echo in 1 year to assess for progression. 3. Dilated aortic root - 61mm on echo 08/2015 which has increased by 45mm since last year.  I will get a Chest CT angio to get a baseline on aortic diameter to see if correlates with echo finding.  If it does then repeat an echo in 6 months to make sure it has not increased in size further.  4.  Family history of premature CAD - nuclear stress test negative for ischemia 10/2013 and he has no angina.  5.  HTN - BP controlled on ARB.  He will continue this.   Medication Adjustments/Labs and Tests Ordered: Current medicines are reviewed at length with the patient today.  Concerns regarding medicines are outlined above.  Medication changes, Labs and Tests ordered today are listed in the Patient Instructions below.  There are no Patient Instructions on file for this visit.   Signed, Fransico Him, MD  09/21/2015 2:59 PM    [address], [place], [place]  [address] Phone: [phone]; Fax: [phone]

## 2015-09-21 NOTE — Patient Instructions (Signed)
Medication Instructions:  Your physician recommends that you continue on your current medications as directed. Please refer to the Current Medication list given to you today.   Labwork: TODAY: BMET  Testing/Procedures: Your physician has requested that you have an echocardiogram in August, 2018. Echocardiography is a painless test that uses sound waves to create images of your heart. It provides your doctor with information about the size and shape of your heart and how well your heart's chambers and valves are working. This procedure takes approximately one hour. There are no restrictions for this procedure.  Dr. Radford Pax recommends you have a CHEST CTA.  Follow-Up: Your physician wants you to follow-up in: 1 year with Dr. Radford Pax. You will receive a reminder letter in the mail two months in advance. If you don't receive a letter, please call our office to schedule the follow-up appointment.   Any Other Special Instructions Will Be Listed Below (If Applicable).     If you need a refill on your cardiac medications before your next appointment, please call your pharmacy.

## 2015-09-28 ENCOUNTER — Ambulatory Visit (INDEPENDENT_AMBULATORY_CARE_PROVIDER_SITE_OTHER)
Admission: RE | Admit: 2015-09-28 | Discharge: 2015-09-28 | Disposition: A | Discharge status: Home / Self Care | Payer: BC Managed Care – PPO | Source: Ambulatory Visit | Attending: Cardiology | Admitting: Cardiology

## 2015-09-28 DIAGNOSIS — I77810 Thoracic aortic ectasia: Secondary | ICD-10-CM

## 2015-09-28 MED ORDER — IOPAMIDOL (ISOVUE-370) INJECTION 76%
100.0000 mL | Freq: Once | INTRAVENOUS | Status: AC | PRN
  Administered 2015-09-28: 100 mL via INTRAVENOUS

## 2015-10-05 ENCOUNTER — Other Ambulatory Visit: Payer: Self-pay | Admitting: *Deleted

## 2015-10-05 ENCOUNTER — Telehealth: Payer: Self-pay | Admitting: *Deleted

## 2015-10-05 DIAGNOSIS — K8689 Other specified diseases of pancreas: Secondary | ICD-10-CM

## 2015-10-05 NOTE — Telephone Encounter (Signed)
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  Please get an MRI of the abdomen for further evaluation. Please ask radiology if this needs to be with contrast and forward results to PCP

## 2015-10-16 ENCOUNTER — Ambulatory Visit (HOSPITAL_COMMUNITY): Payer: BC Managed Care – PPO

## 2015-10-17 ENCOUNTER — Ambulatory Visit (HOSPITAL_COMMUNITY)
Admission: RE | Admit: 2015-10-17 | Discharge: 2015-10-17 | Disposition: A | Discharge status: Home / Self Care | Payer: BC Managed Care – PPO | Source: Ambulatory Visit | Attending: Cardiology | Admitting: Cardiology

## 2015-10-17 DIAGNOSIS — K869 Disease of pancreas, unspecified: Secondary | ICD-10-CM | POA: Insufficient documentation

## 2015-10-17 DIAGNOSIS — K7689 Other specified diseases of liver: Secondary | ICD-10-CM | POA: Diagnosis not present

## 2015-10-17 DIAGNOSIS — N270 Small kidney, unilateral: Secondary | ICD-10-CM | POA: Insufficient documentation

## 2015-10-17 DIAGNOSIS — K8689 Other specified diseases of pancreas: Secondary | ICD-10-CM

## 2015-10-17 MED ORDER — GADOBENATE DIMEGLUMINE 529 MG/ML IV SOLN
15.0000 mL | Freq: Once | INTRAVENOUS | Status: AC
  Administered 2015-10-17: 14 mL via INTRAVENOUS

## 2015-10-23 ENCOUNTER — Telehealth: Payer: Self-pay | Admitting: Cardiology

## 2015-10-23 NOTE — Telephone Encounter (Signed)
PT RETURNING A CALL FROM LAST WEEK FROM [facility], COULDN'T GET THROUGH, PLS CALL

## 2015-10-23 NOTE — Telephone Encounter (Signed)
I spoke with patient and advised of results and recommendation for follow up appt with PCP.  He confirmed PCP is Dr Maury Dus, MD.  I advised patient to contact office ASAP for appt to follow up on scan results. He voiced understanding and agreed with plan. I faxed MR report to Dr Reade's office.

## 2016-04-17 ENCOUNTER — Telehealth: Payer: Self-pay

## 2016-04-17 DIAGNOSIS — I350 Nonrheumatic aortic (valve) stenosis: Secondary | ICD-10-CM

## 2016-04-17 NOTE — Telephone Encounter (Signed)
Left message for pt to call back and schedule an Echocardiogram.  Dr. Radford Pax would like pt to have repeat Echo, just had one in August 2017, because of moderate aortic stenosis results.     Requesting surgical clearance:   1. Type of surgery: Left Hip: THA w/wo autograft/allograft   2. Surgeon: [facility]  3. Surgical date: 06/12/2016  4. Medications that need to be held: ASA     Clearance to be sent to [facility] - Surgical Scheduling [phone] Phone [phone] Fax

## 2016-04-22 ENCOUNTER — Telehealth: Payer: Self-pay | Admitting: Cardiology

## 2016-04-22 NOTE — Telephone Encounter (Signed)
Follow Up    Per pt returning phone call from nurse. Requesting call back

## 2016-04-22 NOTE — Telephone Encounter (Signed)
Left message to call back.  See phone note from 04/17/16.

## 2016-04-22 NOTE — Telephone Encounter (Signed)
Follow up ° °Pt voiced returning nurses call. °

## 2016-04-22 NOTE — Telephone Encounter (Signed)
Left message to call back  

## 2016-04-23 NOTE — Telephone Encounter (Signed)
Per DPR form, left message for patient to call back to reschedule ECHO earlier for surgical clearance.

## 2016-04-24 NOTE — Telephone Encounter (Signed)
ECHO has been scheduled 4/24.

## 2016-05-07 ENCOUNTER — Ambulatory Visit (HOSPITAL_COMMUNITY): Payer: BC Managed Care – PPO | Attending: Cardiovascular Disease

## 2016-05-07 ENCOUNTER — Other Ambulatory Visit: Payer: Self-pay

## 2016-05-07 DIAGNOSIS — I313 Pericardial effusion (noninflammatory): Secondary | ICD-10-CM | POA: Insufficient documentation

## 2016-05-07 DIAGNOSIS — I350 Nonrheumatic aortic (valve) stenosis: Secondary | ICD-10-CM

## 2016-05-13 ENCOUNTER — Ambulatory Visit (INDEPENDENT_AMBULATORY_CARE_PROVIDER_SITE_OTHER): Payer: BC Managed Care – PPO | Admitting: Cardiovascular Disease

## 2016-05-13 ENCOUNTER — Encounter: Payer: Self-pay | Admitting: Cardiovascular Disease

## 2016-05-13 VITALS — BP 146/70 | HR 60 | Ht 69.5 in | Wt 135.6 lb

## 2016-05-13 DIAGNOSIS — I350 Nonrheumatic aortic (valve) stenosis: Secondary | ICD-10-CM | POA: Diagnosis not present

## 2016-05-13 NOTE — Progress Notes (Signed)
Cardiology Office Note Date:  05/13/2016   ID:  CAPTAIN BLUCHER, DOB 08/06/1935, MRN [id number]  PCP:  [name], MD  Cardiologist:  [name], MD    Chief Complaint  Patient presents with  . TAVR consult     History of Present Illness: [name] [name] is a 81 y.o. male who presents for evaluation of severe aortic stenosis, referred by Dr Radford Pax.  Reports he was first noted to have a heart murmur approximately 4 years ago and he has been followed by Dr Radford Pax since that time. He has a hx of both MVP with mild MR and aortic stenosis with bicuspid aortic valve.   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 He has been active in the past but now is quite limited by pain. He is followed by Dr Wynelle Link who has recommended a total hip replacement. The patient has difficulty ambulating and is using a cane.   He is here alone today. Feels very fatigued, especially in the afternoons. He was previously active and able to hike in the woods as recently as 2 years ago without any symptoms. Now is unable to swim or do much walking at all because of his hip problem. He specifically denies chest pain, chest pressure, or shortness of breath. Reports some lightheadedness, no syncope. Feels unsteady on his feet.   The patient works as a Animal nutritionist at [employer]. He is a widower since his wife passed away in May 03, 2007.   Past Medical History:  Diagnosis Date  . Actinic keratoses    Dr Glee Arvin  . Allergic rhinitis   . Aortic valve stenosis, moderate   . Benign essential HTN   . Bicuspid aortic valve   . Bilateral cataracts   . BPH (benign prostatic hypertrophy)   . Dilated aortic root ([place])    35mm by echo 08/2015  . Glaucoma   . H/O seasonal allergies   . Hx of adenomatous colonic polyps 1991 on  . Hx of cardiovascular stress test    Myoview (10/15):  normal  . Hyperlipidemia   . Mitral valve prolapse    mild MR by echo  08/2015  . Paresthesia    Chronic left lateral thigh    Past Surgical History:  Procedure Laterality Date  . COLONOSCOPY  multiple  . TONSILLECTOMY  1949    Current Outpatient Prescriptions  Medication Sig Dispense Refill  . Ascorbic Acid (VITAMIN C) 1000 MG tablet Take 1,000 mg by mouth daily.    Marland Kitchen aspirin 81 MG tablet Take 81 mg by mouth daily.    . fish oil-omega-3 fatty acids 1000 MG capsule Take 1 g by mouth daily.     Marland Kitchen glucosamine-chondroitin 500-400 MG tablet Take 1 tablet by mouth 3 (three) times daily.    Marland Kitchen latanoprost (XALATAN) 0.005 % ophthalmic solution Place 1 drop into both eyes at bedtime.    Marland Kitchen losartan (COZAAR) 100 MG tablet Take 100 mg by mouth daily.    . pravastatin (PRAVACHOL) 40 MG tablet Take 40 mg by mouth daily.     . timolol (BETIMOL) 0.25 % ophthalmic solution Place 1-2 drops into both eyes daily.      No current facility-administered medications for this visit.     Allergies:   Patient has no known allergies.   Social History:  The patient  reports that he quit smoking about 55 years ago. He has never used smokeless tobacco. He reports  that he drinks about 3.0 oz of alcohol per week . He reports that he does not use drugs.   Family History:  The patient's family history includes Emphysema in his mother; Heart attack in his brother; Heart disease in his brother; Pancreatic cancer in his father.   ROS:  Please see the history of present illness.  Otherwise, review of systems is positive for leg pain.  All other systems are reviewed and negative.   PHYSICAL EXAM: VS:  BP (!) 146/70   Pulse 60   Ht 5' 9.5" (1.765 m)   Wt 135 lb 9.6 oz (61.5 kg)   BMI 19.74 kg/m  , BMI Body mass index is 19.74 kg/m. GEN: Thin, elderly male, in no acute distress  HEENT: normal  Neck: no JVD, no masses. bilateral carotid bruits Cardiac: RRR with 3/6 harsh mid-peaking systolic murmur at the RUSB, A2 is audible              Respiratory:  clear to auscultation  bilaterally, normal work of breathing GI: soft, nontender, nondistended, + BS MS: no deformity or atrophy  Ext: no pretibial edema, pedal pulses 2+= bilaterally Skin: warm and dry, no rash Neuro:  Strength and sensation are intact Psych: euthymic mood, full affect  EKG:  EKG is ordered today. The ekg ordered today shows NSR 61 bpm, LAE, LAFB  Recent Labs: 09/21/2015: BUN 18; Creat 0.74; Potassium 4.8; Sodium 130   Lipid Panel  No results found for: CHOL, TRIG, HDL, CHOLHDL, VLDL, LDLCALC, LDLDIRECT    Wt Readings from Last 3 Encounters:  05/13/16 135 lb 9.6 oz (61.5 kg)  09/21/15 147 lb (66.7 kg)  08/14/15 149 lb (67.6 kg)     Cardiac Studies Reviewed: CTA Chest 09/28/2015: IMPRESSION: 1. No thoracic aortic dissection.  Normal caliber thoracic aorta. 2. 6 mm hypodense mass in the mid pancreatic body. Further evaluation with MRI of the abdomen is recommended for better characterization.  MR Abdomen: IMPRESSION: 1. No acute findings identified within the upper abdomen. 2. Benign appearing fluid intensity structure within the body of pancreas is identified without associated enhancement. This is favored to represent a benign etiology such as a small pseudocyst or benign cystic neoplasm of the pancreas. Followup imaging at 12 months advised to ensure stability. 3. Small kidney and liver cysts.  Echo 05/07/2016: Left ventricle:  The cavity size was normal. Wall thickness was increased in a pattern of mild LVH. Systolic function was normal. The estimated ejection fraction was in the range of 60% to 65%. Wall motion was normal; there were no regional wall motion abnormalities. Doppler parameters are consistent with abnormal left ventricular relaxation (grade 1 diastolic dysfunction).  ------------------------------------------------------------------- Aortic valve:   Mildly calcified annulus. Severely thickened, moderately calcified leaflets.  Doppler:   There was  severe stenosis.   There was mild regurgitation.    VTI ratio of LVOT to aortic valve: 0.24. Valve area (VTI): 0.93 cm^2. Indexed valve area (VTI): 0.52 cm^2/m^2. Peak velocity ratio of LVOT to aortic valve: 0.25. Valve area (Vmax): 0.93 cm^2. Indexed valve area (Vmax): 0.52 cm^2/m^2. Mean velocity ratio of LVOT to aortic valve: 0.24. Valve area (Vmean): 0.9 cm^2. Indexed valve area (Vmean): 0.5 cm^2/m^2.  Mean gradient (S): 45 mm Hg. Peak gradient (S): 76 mm Hg.  ------------------------------------------------------------------- Aorta:  Aortic root: The aortic root was normal in size. Ascending aorta: The ascending aorta was normal in size.  ------------------------------------------------------------------- Mitral valve:   Mildly calcified annulus.  Doppler:  There was trivial regurgitation.  Peak gradient (D): 3 mm Hg.  ------------------------------------------------------------------- Left atrium:  The atrium was severely dilated.  ------------------------------------------------------------------- Right ventricle:  The cavity size was normal. Systolic function was normal.  ------------------------------------------------------------------- Pulmonic valve:    The valve appears to be grossly normal. Doppler:  There was mild regurgitation.  ------------------------------------------------------------------- Tricuspid valve:   Structurally normal valve.   Leaflet separation was normal.  Doppler:  Transvalvular velocity was within the normal range. There was mild regurgitation.  ------------------------------------------------------------------- Pulmonary artery:   Systolic pressure was moderately increased.  ------------------------------------------------------------------- Right atrium:  The atrium was mildly dilated.  ------------------------------------------------------------------- Pericardium:  A small, free-flowing pericardial effusion was identified  circumferential to the heart.  ------------------------------------------------------------------- Systemic veins: Inferior vena cava: The vessel was normal in size. The respirophasic diameter changes were in the normal range (>= 50%), consistent with normal central venous pressure.  ------------------------------------------------------------------- Measurements   Left ventricle                            Value          Reference  LV ID, ED, PLAX chordal           (L)     42.8  mm       43 - 52  LV ID, ES, PLAX chordal                   24.6  mm       23 - 38  LV fx shortening, PLAX chordal            43    %        >=29  LV PW thickness, ED                       12    mm       ---------  IVS/LV PW ratio, ED                       1.08           <=1.3  Stroke volume, 2D                         100   ml       ---------  Stroke volume/bsa, 2D                     56    ml/m^2   ---------  LV e&', lateral                            7.02  cm/s     ---------  LV E/e&', lateral                          13.15          ---------  LV e&', medial                             6.36  cm/s     ---------  LV E/e&', medial                           14.51          ---------  LV e&', average                            6.69  cm/s     ---------  LV E/e&', average                          13.8           ---------    Ventricular septum                        Value          Reference  IVS thickness, ED                         13    mm       ---------    LVOT                                      Value          Reference  LVOT ID, S                                22    mm       ---------  LVOT area                                 3.8   cm^2     ---------  LVOT ID                                   22    mm       ---------  LVOT peak velocity, S                     107   cm/s     ---------  LVOT mean velocity, S                     75.9  cm/s     ---------  LVOT VTI, S                               26.2  cm        ---------  LVOT peak gradient, S                     5     mm Hg    ---------  Stroke volume (SV), LVOT DP               99.6  ml       ---------  Stroke index (SV/bsa), LVOT DP            55.3  ml/m^2   ---------    Aortic valve                              Value          Reference  Aortic valve peak  velocity, S             436   cm/s     ---------  Aortic valve mean velocity, S             319   cm/s     ---------  Aortic valve VTI, S                       107   cm       ---------  Aortic mean gradient, S                   45    mm Hg    ---------  Aortic peak gradient, S                   76    mm Hg    ---------  VTI ratio, LVOT/AV                        0.24           ---------  Aortic valve area, VTI                    0.93  cm^2     ---------  Aortic valve area/bsa, VTI                0.52  cm^2/m^2 ---------  Velocity ratio, peak, LVOT/AV             0.25           ---------  Aortic valve area, peak velocity          0.93  cm^2     ---------  Aortic valve area/bsa, peak               0.52  cm^2/m^2 ---------  velocity  Velocity ratio, mean, LVOT/AV             0.24           ---------  Aortic valve area, mean velocity          0.9   cm^2     ---------  Aortic valve area/bsa, mean               0.5   cm^2/m^2 ---------  velocity  Aortic regurg pressure half-time          555   ms       ---------    Aorta                                     Value          Reference  Aortic root ID, ED                        39    mm       ---------  Ascending aorta ID, A-P, S                33    mm       ---------    Left atrium                               Value          Reference  LA ID, A-P, ES                            41    mm       ---------  LA ID/bsa, A-P                    (H)     2.28  cm/m^2   <=2.2  LA volume, S                              95    ml       ---------  LA volume/bsa, S                          52.8  ml/m^2   ---------  LA volume, ES, 1-p A4C                     97    ml       ---------  LA volume/bsa, ES, 1-p A4C                53.9  ml/m^2   ---------  LA volume, ES, 1-p A2C                    83    ml       ---------  LA volume/bsa, ES, 1-p A2C                46.1  ml/m^2   ---------    Mitral valve                              Value          Reference  Mitral E-wave peak velocity               92.3  cm/s     ---------  Mitral A-wave peak velocity               111   cm/s     ---------  Mitral deceleration time          (H)     289   ms       150 - 230  Mitral peak gradient, D                   3     mm Hg    ---------  Mitral E/A ratio, peak                    0.8            ---------    Pulmonary arteries                        Value          Reference  PA pressure, S, DP                (H)     41    mm Hg    <=30    Tricuspid valve                           Value  Reference  Tricuspid regurg peak velocity            307   cm/s     ---------  Tricuspid peak RV-RA gradient             38    mm Hg    ---------    Right ventricle                           Value          Reference  RV s&', lateral, S                         16.8  cm/s     ---------  Legend: (L)  and  (H)  mark values outside specified reference range.  STS Risk Calculator: Procedure: AV Replacement  Risk of Mortality: 1.873%  Morbidity or Mortality: 13.624%  Long Length of Stay: 4.526%  Short Length of Stay: 45.359%  Permanent Stroke: 1.286%  Prolonged Ventilation: 7.359%  DSW Infection: 0.225%  Renal Failure: 2.917%  Reoperation: 7.145%   ASSESSMENT AND PLAN: 81 yo male with severe bicuspid aortic valve stenosis: difficult to know whether there is any cardiovascular functional limitation because he has become so limited from end-stage osteoarthritis of the left hip. I have reviewed the natural history of aortic stenosis with the patient today. We have discussed the limitations of medical therapy and the poor prognosis associated with symptomatic aortic  stenosis. We have reviewed potential treatment options, including observation with medical therapy, conventional surgical aortic valve replacement, and transcatheter aortic valve replacement. We discussed treatment options in the context of this patient's specific comorbid medical conditions. He has had significant progression of aortic stenosis by echo criteria. I have personally reviewed his echo images and he has clear evidence of severe AS with a peak transvalvular velocity in excess of 4 m/s and mean gradient 47 mmHg. There is mild aortic insufficiency present. While he appears to have a bicuspid valve morphology, he will not need aortic root surgery as his ascending aorta has a maximal diameter of 3.5 cm by CTA last year. Considering his need for hip surgery to allow for him to return to a normal functional status, it appears best to treat his progressive and severe aortic stenosis prior to noncardiac surgery. I have recommended R/L heart catheterization, CT angiography of the heart prior to formal cardiac surgical evaluation. If he ultimately requires conventional surgery, he would be interested in a minimally invasive approach. Will refer him for evaluation by Dr Roxy Manns after his cath and CTA studies are completed.   I have reviewed the risks, indications, and alternatives to cardiac catheterization, possible angioplasty, and stenting with the patient. Risks include but are not limited to bleeding, infection, vascular injury, stroke, myocardial infection, arrhythmia, kidney injury, radiation-related injury in the case of prolonged fluoroscopy use, emergency cardiac surgery, and death. The patient understands the risks of serious complication is 1-2 in 9983 with diagnostic cardiac cath and 1-2% or less with angioplasty/stenting.   Current medicines are reviewed with the patient today.  The patient does not have concerns regarding medicines.  Labs/ tests ordered today include:   Orders Placed This  Encounter  Procedures  . CT ANGIO CHEST AORTA W &/OR WO CONTRAST  . CT Angio Abd/Pel w/ and/or w/o  . CT CORONARY MORPH W/CTA COR W/SCORE W/CA W/CM &/OR WO/CM  . Basic metabolic panel  . Ambulatory  referral to Cardiothoracic Surgery  . EKG 12-Lead    Disposition:   FU pending test results  Signed, [name], MD  05/13/2016 2:37 PM    [address], [place], [place]  [address] Phone: [phone]; Fax: [phone]

## 2016-05-13 NOTE — Patient Instructions (Addendum)
Medication Instructions:  Your physician recommends that you continue on your current medications as directed. Please refer to the Current Medication list given to you today.  Labwork: Your physician recommends that you have lab work today: BMP  Testing/Procedures: Non-Cardiac CT Angiography (CTA), is a special type of CT scan that uses a computer to produce multi-dimensional views of major blood vessels throughout the body. In CT angiography, a contrast material is injected through an IV to help visualize the blood vessels (CTA Chest/Abdomen and Pelvis)  Your physician has requested that you have cardiac CT. Cardiac computed tomography (CT) is a painless test that uses an x-ray machine to take clear, detailed pictures of your heart. For further information please visit [facility]ugeFiesta.tn. Please follow instruction sheet as given.  Pre CT instructions: Please arrive at 9:15 on Wednesday, May 9 for tests, [facility]  Entrance A  No solid foods, caffeine or smoking 4 hours prior to CT. No herbal supplements by mouth 24 hours prior to CT. No sexual enhancement drugs 72 hours prior to CT.   Your physician has requested that you have a cardiac catheterization. Cardiac catheterization is used to diagnose and/or treat various heart conditions. Doctors may recommend this procedure for a number of different reasons. The most common reason is to evaluate chest pain. Chest pain can be a symptom of coronary artery disease (CAD), and cardiac catheterization can show whether plaque is narrowing or blocking your heart's arteries. This procedure is also used to evaluate the valves, as well as measure the blood flow and oxygen levels in different parts of your heart. For further information please visit HugeFiesta[facility]tn.     [facility] [address] [place] [address] Dept: [phone] Loc:  [place]  05/13/2016  You are scheduled for a Cardiac Catheterization on Wednesday, May 16 with Dr. [name].  1. Please arrive at the [facility] (Main Entrance A) at [facility]: [address] [place], [place] [address] at 11:00 AM (two hours before your procedure to ensure your preparation). Free valet parking service is available.   Special note: Every effort is made to have your procedure done on time. Please understand that emergencies sometimes delay scheduled procedures.  2. Diet: Do not eat or drink anything after midnight prior to your procedure except sips of water to take medications.  3. Labs: Your labs will be performed at the hospital after you arrive for your procedure.  4. Medication instructions in preparation for your procedure:  On the morning of your procedure, take your Aspirin and any morning medicines NOT listed above.  You may use sips of water.  5. Plan for one night stay--bring personal belongings.  6. Bring a current list of your medications and current insurance cards.  7. You MUST have a responsible person to drive you home.  8. Someone MUST be with you the first 24 hours after you arrive home or your discharge will be delayed.  9. Please wear clothes that are easy to get on and off and wear slip-on shoes.  Thank you for allowing [place] to care for you!   -- [facility]   Follow-Up: You have been referred to Dr Roxy Manns for further evaluation of Aortic Stenosis. Levonne Spiller RN is the contact--[phone])   Any Other Special Instructions Will Be Listed Below (If Applicable).     If you need a refill  on your cardiac medications before your next appointment, please call your pharmacy.

## 2016-05-14 ENCOUNTER — Other Ambulatory Visit: Payer: Self-pay | Admitting: *Deleted

## 2016-05-14 DIAGNOSIS — I350 Nonrheumatic aortic (valve) stenosis: Secondary | ICD-10-CM

## 2016-05-14 LAB — BASIC METABOLIC PANEL
BUN / CREAT RATIO: 21 (ref 10–24)
BUN: 13 mg/dL (ref 8–27)
CALCIUM: 9.7 mg/dL (ref 8.6–10.2)
CHLORIDE: 93 mmol/L — AB (ref 96–106)
CO2: 28 mmol/L (ref 18–29)
Creatinine, Ser: 0.61 mg/dL — ABNORMAL LOW (ref 0.76–1.27)
GFR calc non Af Amer: 96 mL/min/{1.73_m2} (ref >59)
GFR, EST AFRICAN AMERICAN: 111 mL/min/{1.73_m2} (ref >59)
Glucose: 84 mg/dL (ref 65–99)
POTASSIUM: 5.1 mmol/L (ref 3.5–5.2)
SODIUM: 136 mmol/L (ref 134–144)

## 2016-05-22 ENCOUNTER — Encounter (HOSPITAL_COMMUNITY): Payer: Self-pay

## 2016-05-22 ENCOUNTER — Inpatient Hospital Stay (HOSPITAL_COMMUNITY): Admission: RE | Admit: 2016-05-22 | Payer: BC Managed Care – PPO | Source: Ambulatory Visit

## 2016-05-22 ENCOUNTER — Ambulatory Visit (HOSPITAL_COMMUNITY)
Admission: RE | Admit: 2016-05-22 | Discharge: 2016-05-22 | Disposition: A | Discharge status: Home / Self Care | Payer: BC Managed Care – PPO | Source: Ambulatory Visit | Attending: Cardiovascular Disease | Admitting: Cardiovascular Disease

## 2016-05-22 ENCOUNTER — Ambulatory Visit (HOSPITAL_COMMUNITY): Payer: BC Managed Care – PPO

## 2016-05-22 DIAGNOSIS — I350 Nonrheumatic aortic (valve) stenosis: Secondary | ICD-10-CM | POA: Insufficient documentation

## 2016-05-22 DIAGNOSIS — I2510 Atherosclerotic heart disease of native coronary artery without angina pectoris: Secondary | ICD-10-CM | POA: Insufficient documentation

## 2016-05-22 DIAGNOSIS — I281 Aneurysm of pulmonary artery: Secondary | ICD-10-CM | POA: Diagnosis not present

## 2016-05-22 DIAGNOSIS — K5730 Diverticulosis of large intestine without perforation or abscess without bleeding: Secondary | ICD-10-CM | POA: Diagnosis not present

## 2016-05-22 DIAGNOSIS — I700 Atherosclerosis of aorta: Secondary | ICD-10-CM | POA: Diagnosis not present

## 2016-05-22 DIAGNOSIS — I770 Arteriovenous fistula, acquired: Secondary | ICD-10-CM | POA: Insufficient documentation

## 2016-05-22 MED ORDER — IOPAMIDOL (ISOVUE-370) INJECTION 76%
INTRAVENOUS | Status: AC
  Administered 2016-05-22: 75 mL
  Filled 2016-05-22: qty 100

## 2016-05-22 MED ORDER — IOPAMIDOL (ISOVUE-370) INJECTION 76%
INTRAVENOUS | Status: AC
  Administered 2016-05-22: 75 mL
  Filled 2016-05-22: qty 50

## 2016-05-27 ENCOUNTER — Encounter: Payer: Self-pay | Admitting: Thoracic Surgery (Cardiothoracic Vascular Surgery)

## 2016-05-27 ENCOUNTER — Institutional Professional Consult (permissible substitution) (INDEPENDENT_AMBULATORY_CARE_PROVIDER_SITE_OTHER): Payer: BC Managed Care – PPO | Admitting: Thoracic Surgery (Cardiothoracic Vascular Surgery)

## 2016-05-27 VITALS — BP 98/62 | HR 77 | Resp 20 | Ht 69.0 in | Wt 140.0 lb

## 2016-05-27 DIAGNOSIS — Q231 Congenital insufficiency of aortic valve: Secondary | ICD-10-CM | POA: Diagnosis not present

## 2016-05-27 DIAGNOSIS — I350 Nonrheumatic aortic (valve) stenosis: Secondary | ICD-10-CM | POA: Diagnosis not present

## 2016-05-27 NOTE — Patient Instructions (Signed)
Continue all previous medications without any changes at this time  

## 2016-05-27 NOTE — Progress Notes (Signed)
[facility]  MULTIDISCIPLINARY HEART VALVE CLINIC  CARDIOTHORACIC SURGERY CONSULTATION REPORT  Referring Provider is Sueanne Margarita, MD PCP is Maury Dus, MD  Chief Complaint  Patient presents with  . Aortic Stenosis    HPI:  Patient is a 81 year old male with bicuspid aortic valve and aortic stenosis, mitral valve prolapse without significant mitral regurgitation, hypertension, hyperlipidemia, benign prostatic hypertrophy, and severe degenerative arthritis of affecting the left hip with severely limited physical mobility with been referred for surgical consultation to discuss treatment options for management of severe symptomatic aortic stenosis.  The patient states that he was first noted to have a heart murmur on routine physical exam performed several years ago prior to screening colonoscopy. He was diagnosed with aortic stenosis and has been followed for several years by Dr. Radford Pax. He developed severe degenerative arthritis of lifting his left hip which has regressed dramatically over the past year to the point where he is now very limited. He ambulates using considerable difficulty with a cane. He has been evaluated by Dr. Reynaldo Minium who has recommended total hip replacement. The patient states that over this period of time he has also developed progressive fatigue with some exertional shortness of breath and frequent dizzy spells.  He underwent routine transthoracic echocardiogram 05/07/2016 which revealed significant progression in severity patient's aortic stenosis with peak velocity across the aortic valve measured close to 4.4 m/s corresponding to mean transvalvular gradient estimated 45 mmHg. Left ventricular systolic function remained normal with ejection fraction estimated 60-65%.  The patient was referred to the multidisciplinary heart valve clinic and evaluated by Dr. Burt Knack on 05/13/2016. The patient has subsequently undergone CT angiography and has been scheduled for  diagnostic cardiac catheterization later this week. 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 The patient was referred for surgical consultation.  The patient is a widower and lives alone locally in [place]. 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 He states that he had been active physically until he began to develop problems with his hip. He is now severely limited because of pain in his left hip and knee. He ambulates with considerable difficulty using a cane. He gets short of breath and tired easily. He has had frequent dizzy spells without syncope. He denies any chest pain or chest tightness either with activity or at rest. He denies any history of PND, orthopnea, or lower extremity edema. The patient also complains that he has had progressive problems with short-term memory difficulty. 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 He states that every time some anastomotic question he has to look it up for an answer. He sometimes gets confused when he is doing things and forgets what he set up to do. He still drives an automobile and tends to his own personal needs and he has not had any severe problems related to short-term memory disturbance.  He does feel very tired and fatigued and he has lost 5 pounds over the past 6 months. Appetite is marginal.  Past Medical History:  Diagnosis Date  . Actinic keratoses    Dr Glee Arvin  . Allergic rhinitis   . Aortic stenosis   . Benign essential HTN   . Bicuspid aortic valve   .  Bilateral cataracts   . BPH (benign prostatic hypertrophy)   . Dilated aortic root ([place])    40mm by echo 08/2015  . Glaucoma   . H/O seasonal allergies   . Hx of adenomatous colonic polyps 1991  on  . Hx of cardiovascular stress test    Myoview (10/15):  normal  . Hyperlipidemia   . Mitral valve prolapse    mild MR by echo 08/2015  . Paresthesia    Chronic left lateral thigh    Past Surgical History:  Procedure Laterality Date  . COLONOSCOPY  multiple  . TONSILLECTOMY  1949    Family History  Problem Relation Age of Onset  . Emphysema Mother   . Pancreatic cancer Father   . Heart attack Brother   . Heart disease Brother   . Colon cancer Neg Hx   . Stomach cancer Neg Hx     Social History   Social History  . Marital status: Widowed    Spouse name: N/A  . Number of children: N/A  . Years of education: N/A   Occupational History  . Not on file.   Social History Main Topics  . Smoking status: Former Smoker    Quit date: 07/14/1960  . Smokeless tobacco: Never Used  . Alcohol use 3.0 oz/week    5 Cans of beer per week  . Drug use: No  . Sexual activity: Not on file   Other Topics Concern  . Not on file   Social History Narrative  . No narrative on file    Current Outpatient Prescriptions  Medication Sig Dispense Refill  . acetaminophen (TYLENOL) 500 MG tablet Take 1,000 mg by mouth every 4 (four) hours as needed for moderate pain.    . Ascorbic Acid (VITAMIN C) 1000 MG tablet Take 1,000 mg by mouth daily.    Marland Kitchen aspirin 81 MG tablet Take 81 mg by mouth every evening.     . fluticasone (FLONASE) 50 MCG/ACT nasal spray Place 2 sprays into both nostrils daily as needed for allergies or rhinitis.    . Glucosamine-Chondroit-Vit C-Mn (GLUCOSAMINE CHONDR 1500 COMPLX PO) Take 1 tablet by mouth daily.    Marland Kitchen latanoprost (XALATAN) 0.005 % ophthalmic solution Place 1 drop into both eyes at bedtime.    Marland Kitchen losartan (COZAAR) 100 MG tablet Take 100 mg by mouth daily.    . Omega-3 Fatty Acids (FISH OIL) 500 MG CAPS Take 500 mg by mouth daily.    . pravastatin (PRAVACHOL) 40 MG tablet Take 40 mg by mouth every evening.     . timolol (BETIMOL) 0.5 % ophthalmic solution Place  1 drop into both eyes 2 (two) times daily.     No current facility-administered medications for this visit.     No Known Allergies    Review of Systems:   General:  decreased appetite, decreased energy, no weight gain, + weight loss, no fever  Cardiac:  no chest pain with exertion, no chest pain at rest, + SOB with exertion, no resting SOB, no PND, no orthopnea, no palpitations, no arrhythmia, no atrial fibrillation, no LE edema, + dizzy spells, no syncope  Respiratory:  no shortness of breath, no home oxygen, no productive cough, no dry cough, no bronchitis, no wheezing, no hemoptysis, no asthma, no pain with inspiration or cough, no sleep apnea, no CPAP at night  GI:   no difficulty swallowing, no reflux, no frequent heartburn, no hiatal hernia, no abdominal pain, no constipation, no diarrhea, no hematochezia, no  hematemesis, no melena  GU:   no dysuria,  no frequency, no urinary tract infection, no hematuria, + enlarged prostate, no kidney stones, no kidney disease  Vascular:  no pain suggestive of claudication, no pain in feet, no leg cramps, no varicose veins, no DVT, no non-healing foot ulcer  Neuro:   no stroke, ? TIA's, no seizures, + headaches, no temporary blindness one eye,  no slurred speech, no peripheral neuropathy, + chronic pain, + instability of gait, + memory/cognitive dysfunction  Musculoskeletal: + arthritis, no joint swelling, no myalgias, + difficulty walking, limited mobility   Skin:   no rash, no itching, no skin infections, no pressure sores or ulcerations  Psych:   no anxiety, no depression, no nervousness, no unusual recent stress  Eyes:   + blurry vision, no floaters, no recent vision changes, + wears glasses or contacts  ENT:   no hearing loss, no loose or painful teeth, no dentures, last saw dentist within the past year  Hematologic:  no easy bruising, no abnormal bleeding, no clotting disorder, no frequent epistaxis  Endocrine:  no diabetes, does not check CBG's  at home           Physical Exam:   BP 98/62   Pulse 77   Resp 20   Ht 5\' 9"  (1.753 m)   Wt 140 lb (63.5 kg)   SpO2 97%   BMI 20.67 kg/m   General:  Thin, somewhat frail-appearing  HEENT:  Unremarkable   Neck:   no JVD, no bruits, no adenopathy   Chest:   clear to auscultation, symmetrical breath sounds, no wheezes, no rhonchi   CV:   RRR, grade III/VI crescendo/decrescendo murmur heard best at RSB,  no diastolic murmur  Abdomen:  soft, non-tender, no masses   Extremities:  warm, well-perfused, pulses palpable, no LE edema  Rectal/GU  Deferred  Neuro:   Grossly non-focal and symmetrical throughout  Skin:   Clean and dry, no rashes, no breakdown   Diagnostic Tests:  Transthoracic Echocardiography  Patient:    [name] [name], [name] [name] MR #:       [id number] Study Date: 05/07/2016 Gender:     M Age:        37 Height:     175.3 cm Weight:     66.7 kg BSA:        1.8 m^2 Pt. Status: Room:   ATTENDING    Fransico Him, MD  ORDERING     Fransico Him, MD  REFERRING    Fransico Him, MD  SONOGRAPHER  Cindy Hazy, RDCS  PERFORMING   [facility], Outpatient  cc:  ------------------------------------------------------------------- LV EF: 60% -   65%  ------------------------------------------------------------------- Indications:      I35.9 Aortic Valve Disorder.  ------------------------------------------------------------------- History:   PMH:  Acquired from the patient and from the patient&'s chart.  PMH:  Bicuspid Aortic Valve. Dilated Aortic Root. MVP. Risk factors:  Hypertension. Dyslipidemia.  ------------------------------------------------------------------- Study Conclusions  - Left ventricle: The cavity size was normal. Wall thickness was   increased in a pattern of mild LVH. Systolic function was normal.   The estimated ejection fraction was in the range of 60% to 65%.   Wall motion was normal; there were no regional wall motion   abnormalities.  Doppler parameters are consistent with abnormal   left ventricular relaxation (grade 1 diastolic dysfunction). - Aortic valve: Mildly calcified annulus. Severely thickened,   moderately calcified leaflets. There was severe stenosis. There   was mild regurgitation. - Mitral  valve: Mildly calcified annulus. - Left atrium: The atrium was severely dilated. - Right atrium: The atrium was mildly dilated. - Pulmonary arteries: Systolic pressure was moderately increased.   PA peak pressure: 41 mm Hg (S). - Pericardium, extracardiac: A small, free-flowing pericardial   effusion was identified circumferential to the heart.  ------------------------------------------------------------------- Study data:   Study status:  Routine.  Procedure:  The patient reported no pain pre or post test. Transthoracic echocardiography for left ventricular function evaluation, for right ventricular function evaluation, and for assessment of valvular function. Image quality was adequate.  Study completion:  There were no complications.          Transthoracic echocardiography.  M-mode, complete 2D, spectral Doppler, and color Doppler.  Birthdate: Patient birthdate: [birth year].  Age:  Patient is 81 yr old.  Sex: Gender: male.    BMI: 21.7 kg/m^2.  Blood pressure:     112/58 Patient status:  Outpatient.  Study date:  Study date: 05/07/2016. Study time: 04:05 PM.  Location:  [facility]  -------------------------------------------------------------------  ------------------------------------------------------------------- Left ventricle:  The cavity size was normal. Wall thickness was increased in a pattern of mild LVH. Systolic function was normal. The estimated ejection fraction was in the range of 60% to 65%. Wall motion was normal; there were no regional wall motion abnormalities. Doppler parameters are consistent with abnormal left ventricular relaxation (grade 1 diastolic  dysfunction).  ------------------------------------------------------------------- Aortic valve:   Mildly calcified annulus. Severely thickened, moderately calcified leaflets.  Doppler:   There was severe stenosis.   There was mild regurgitation.    VTI ratio of LVOT to aortic valve: 0.24. Valve area (VTI): 0.93 cm^2. Indexed valve area (VTI): 0.52 cm^2/m^2. Peak velocity ratio of LVOT to aortic valve: 0.25. Valve area (Vmax): 0.93 cm^2. Indexed valve area (Vmax): 0.52 cm^2/m^2. Mean velocity ratio of LVOT to aortic valve: 0.24. Valve area (Vmean): 0.9 cm^2. Indexed valve area (Vmean): 0.5 cm^2/m^2.  Mean gradient (S): 45 mm Hg. Peak gradient (S): 76 mm Hg.  ------------------------------------------------------------------- Aorta:  Aortic root: The aortic root was normal in size. Ascending aorta: The ascending aorta was normal in size.  ------------------------------------------------------------------- Mitral valve:   Mildly calcified annulus.  Doppler:  There was trivial regurgitation.    Peak gradient (D): 3 mm Hg.  ------------------------------------------------------------------- Left atrium:  The atrium was severely dilated.  ------------------------------------------------------------------- Right ventricle:  The cavity size was normal. Systolic function was normal.  ------------------------------------------------------------------- Pulmonic valve:    The valve appears to be grossly normal. Doppler:  There was mild regurgitation.  ------------------------------------------------------------------- Tricuspid valve:   Structurally normal valve.   Leaflet separation was normal.  Doppler:  Transvalvular velocity was within the normal range. There was mild regurgitation.  ------------------------------------------------------------------- Pulmonary artery:   Systolic pressure was moderately  increased.  ------------------------------------------------------------------- Right atrium:  The atrium was mildly dilated.  ------------------------------------------------------------------- Pericardium:  A small, free-flowing pericardial effusion was identified circumferential to the heart.  ------------------------------------------------------------------- Systemic veins: Inferior vena cava: The vessel was normal in size. The respirophasic diameter changes were in the normal range (>= 50%), consistent with normal central venous pressure.  ------------------------------------------------------------------- Measurements   Left ventricle                            Value          Reference  LV ID, ED, PLAX chordal           (L)     42.8  mm  43 - 52  LV ID, ES, PLAX chordal                   24.6  mm       23 - 38  LV fx shortening, PLAX chordal            43    %        >=29  LV PW thickness, ED                       12    mm       ---------  IVS/LV PW ratio, ED                       1.08           <=1.3  Stroke volume, 2D                         100   ml       ---------  Stroke volume/bsa, 2D                     56    ml/m^2   ---------  LV e&', lateral                            7.02  cm/s     ---------  LV E/e&', lateral                          13.15          ---------  LV e&', medial                             6.36  cm/s     ---------  LV E/e&', medial                           14.51          ---------  LV e&', average                            6.69  cm/s     ---------  LV E/e&', average                          13.8           ---------    Ventricular septum                        Value          Reference  IVS thickness, ED                         13    mm       ---------    LVOT                                      Value          Reference  LVOT ID, S  22    mm       ---------  LVOT area                                 3.8    cm^2     ---------  LVOT ID                                   22    mm       ---------  LVOT peak velocity, S                     107   cm/s     ---------  LVOT mean velocity, S                     75.9  cm/s     ---------  LVOT VTI, S                               26.2  cm       ---------  LVOT peak gradient, S                     5     mm Hg    ---------  Stroke volume (SV), LVOT DP               99.6  ml       ---------  Stroke index (SV/bsa), LVOT DP            55.3  ml/m^2   ---------    Aortic valve                              Value          Reference  Aortic valve peak velocity, S             436   cm/s     ---------  Aortic valve mean velocity, S             319   cm/s     ---------  Aortic valve VTI, S                       107   cm       ---------  Aortic mean gradient, S                   45    mm Hg    ---------  Aortic peak gradient, S                   76    mm Hg    ---------  VTI ratio, LVOT/AV                        0.24           ---------  Aortic valve area, VTI                    0.93  cm^2     ---------  Aortic valve area/bsa, VTI  0.52  cm^2/m^2 ---------  Velocity ratio, peak, LVOT/AV             0.25           ---------  Aortic valve area, peak velocity          0.93  cm^2     ---------  Aortic valve area/bsa, peak               0.52  cm^2/m^2 ---------  velocity  Velocity ratio, mean, LVOT/AV             0.24           ---------  Aortic valve area, mean velocity          0.9   cm^2     ---------  Aortic valve area/bsa, mean               0.5   cm^2/m^2 ---------  velocity  Aortic regurg pressure half-time          555   ms       ---------    Aorta                                     Value          Reference  Aortic root ID, ED                        39    mm       ---------  Ascending aorta ID, A-P, S                33    mm       ---------    Left atrium                               Value          Reference  LA ID, A-P, ES                             41    mm       ---------  LA ID/bsa, A-P                    (H)     2.28  cm/m^2   <=2.2  LA volume, S                              95    ml       ---------  LA volume/bsa, S                          52.8  ml/m^2   ---------  LA volume, ES, 1-p A4C                    97    ml       ---------  LA volume/bsa, ES, 1-p A4C                53.9  ml/m^2   ---------  LA volume, ES, 1-p A2C  83    ml       ---------  LA volume/bsa, ES, 1-p A2C                46.1  ml/m^2   ---------    Mitral valve                              Value          Reference  Mitral E-wave peak velocity               92.3  cm/s     ---------  Mitral A-wave peak velocity               111   cm/s     ---------  Mitral deceleration time          (H)     289   ms       150 - 230  Mitral peak gradient, D                   3     mm Hg    ---------  Mitral E/A ratio, peak                    0.8            ---------    Pulmonary arteries                        Value          Reference  PA pressure, S, DP                (H)     41    mm Hg    <=30    Tricuspid valve                           Value          Reference  Tricuspid regurg peak velocity            307   cm/s     ---------  Tricuspid peak RV-RA gradient             38    mm Hg    ---------    Right ventricle                           Value          Reference  RV s&', lateral, S                         16.8  cm/s     ---------  Legend: (L)  and  (H)  mark values outside specified reference range.  ------------------------------------------------------------------- Prepared and Electronically Authenticated by  Mertie Moores, M.D. 2018-04-24T17:19:39    Cardiac TAVR CT  TECHNIQUE: The patient was scanned on a Philips 256 scanner. A 120 kV retrospective scan was triggered in the descending thoracic aorta at 111 HU's. Gantry rotation speed was 270 msecs and collimation was .9 mm. No beta blockade or nitro were given. The 3D data  set was reconstructed in 5% intervals of the R-R cycle. Systolic and diastolic phases were analyzed on a dedicated work station using MPR, MIP and VRT modes. The patient received 80 cc  of contrast.  FINDINGS: Aortic Valve: Severely thickened and calcified. The valve is tricuspid but functionally bicuspid with partially grown left and right coronary cusp. There are minimal calcifications extending into the LVOT.  Aorta:  Normal size. Mild diffuse calcifications.  No dissection.  Sinotubular Junction:  31 x 31 mm  Ascending Thoracic Aorta:  34 x 33 mm  Aortic Arch:  30 x 28 mm  Descending Thoracic Aorta:  28 x 27 mm  Sinus of Valsalva Measurements:  Non-coronary:  42 mm  Right -coronary:  39 mm  Left -coronary:  40 mm  Coronary Artery Height above Annulus:  Left Main:  15 mm  Right Coronary:  16 mm  Virtual Basal Annulus Measurements:  Maximum/Minimum Diameter:  32 x 24 mm  Perimeter:  104 mm  Area:  635 mm2  Coronary Arteries: Normal origin. The study not technically sufficient for plaque evaluation.  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  Optimum Fluoroscopic Angle for Delivery:  LAO 13 CAU 10  Normal pulmonary vein drainage into the left atrium.  No thrombus in a large left atrial appendage.  Dilated pulmonary artery measuring 31 x 29 mm consistent with pulmonary hypertension.  IMPRESSION: 1. The valve is tricuspid but functionally bicuspid with partially grown left and right coronary cusp. The valve is severely thickened and calcified with severe restriction in leaflet opening. Annular measurements suitable for delivery of a 29 mm Edwards-SAPIEN 3 valve.  2. Sufficient annulus to coronary distance.  3. Optimum Fluoroscopic Angle for Delivery:  LAO 13 CAU 10.  4. No left atrial thrombus.  5. Dilated pulmonary artery measuring 31 x 29 mm consistent with pulmonary  hypertension.  6. 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  Ena Dawley   Electronically Signed   By: Ena Dawley   On: 05/24/2016 17:41       CT ANGIOGRAPHY CHEST, ABDOMEN AND PELVIS  TECHNIQUE: Multidetector CT imaging through the chest, abdomen and pelvis was performed using the standard protocol during bolus administration of intravenous contrast. Multiplanar reconstructed images and MIPs were obtained and reviewed to evaluate the vascular anatomy.  CONTRAST:  75 mL of Isovue 370.  COMPARISON:  Chest CT 09/28/2015.  FINDINGS: CTA CHEST FINDINGS  Cardiovascular: Heart size is borderline enlarged. There is no significant pericardial fluid, thickening or pericardial calcification. There is aortic atherosclerosis, as well as atherosclerosis of the great vessels of the mediastinum and the coronary arteries, including calcified atherosclerotic plaque in the left main, left anterior descending, left circumflex and right coronary arteries. Severe thickening calcification of the aortic valve root.  Mediastinum/Lymph Nodes: No pathologically enlarged mediastinal or hilar lymph nodes. Esophagus is unremarkable in appearance. No axillary lymphadenopathy.  Lungs/Pleura: No suspicious appearing pulmonary nodules or masses. No acute consolidative airspace disease. No pleural effusions.  Musculoskeletal/Soft Tissues: There are no aggressive appearing lytic or blastic lesions noted in the visualized portions of the skeleton.  CTA ABDOMEN AND PELVIS FINDINGS  Hepatobiliary: 11 mm low-attenuation lesion in segment 2 of the liver is compatible with a simple cyst. No other suspicious appearing hepatic lesions. No intra or extrahepatic biliary ductal dilatation. Gallbladder is normal in appearance.  Pancreas: No pancreatic mass. No pancreatic ductal dilatation. No pancreatic or peripancreatic fluid or  inflammatory changes.  Spleen: Unremarkable.  Adrenals/Urinary Tract: Bilateral kidneys and bilateral adrenal glands are normal in appearance. There is no hydroureteronephrosis. Urinary bladder wall appears diffusely thickened and heavily  trabeculated. Several small bladder wall diverticulae are noted. The largest of these is on the left side posteriorly measuring up to 1.5 cm in diameter. Notably, within this diverticulum there is a small filling defect measuring 6 mm (axial image 282 of series 5 and coronal image 42 of series 8) which could simply reflect some internal debris, however, the possibility of a small urothelial neoplasm should be considered.  Stomach/Bowel: The appearance of the stomach is normal. There is no pathologic dilatation of small bowel or colon. Several colonic diverticulae are noted, particularly in the region of the descending colon and sigmoid colon, without surrounding inflammatory changes to suggest an acute diverticulitis at this time. Normal appendix.  Vascular/Lymphatic: Aortic atherosclerosis, with vascular findings and measurements pertinent to potential TAVR procedure, as detailed below. No aneurysm or dissection noted in the abdominal or pelvic vasculature. Mild narrowing of the ostium of the celiac axis related to compression from the overlying diaphragmatic crus. Remaining portions of the celiac axis, superior mesenteric artery and inferior mesenteric artery are all widely patent without hemodynamically significant stenosis. Single renal arteries bilaterally appear widely patent without hemodynamically significant stenosis. There is also moderate to severe stenosis of the proximal left superficial femoral artery immediately after the bifurcation of the left common femoral artery where the vessel measures only 5.6 x 1.9 mm. No lymphadenopathy noted in the abdomen or pelvis.  Reproductive: Prostate gland is enlarged with severe median  lobe hypertrophy, measuring 4.4 x 6.0 x 7.2 cm. Seminal vesicles are unremarkable in appearance. Bilateral hydroceles (right greater than left) moderate in size.  Other: No significant volume of ascites.  No pneumoperitoneum.  Musculoskeletal: There are no aggressive appearing lytic or blastic lesions noted in the visualized portions of the skeleton.  VASCULAR MEASUREMENTS PERTINENT TO TAVR:  AORTA:  Minimal Aortic Diameter -  16 x 15 mm  Severity of Aortic Calcification -  moderate  RIGHT PELVIS:  Right Common Iliac Artery -  Minimal Diameter - 6.5 x 8.2 mm  Tortuosity - mild  Calcification - moderate  Right External Iliac Artery -  Minimal Diameter - 6.6 x 2.0 mm  Tortuosity - moderate  Calcification - minimal  Right Common Femoral Artery -  Minimal Diameter - 6.4 x 8.7 mm  Tortuosity - mild  Calcification - mild  LEFT PELVIS:  Left Common Iliac Artery -  Minimal Diameter - 9.3 x 8.2 mm  Tortuosity - mild  Calcification - moderate  Left External Iliac Artery -  Minimal Diameter - 7.1 x 8.2 mm  Tortuosity - moderate  Calcification - none  Left Common Femoral Artery -  Minimal Diameter - 8.0 x 7.4 mm  Tortuosity - mild  Calcification - mild  Review of the MIP images confirms the above findings.  IMPRESSION: 1. Vascular findings and measurements pertinent to potential TAVR procedure, as detailed above. The patient does appear to have suitable pelvic arterial access bilaterally. 2. Severe thickening calcification of the aortic valve, compatible with the reported clinical history of severe aortic stenosis. 3. **An incidental finding of potential clinical significance has been found. Changes in the urinary bladder suggestive of chronic bladder outlet obstruction, including a thickened and heavily trabeculated wall with multiple small diverticulae. Notably, there is a diverticulum along the posterior aspect  of the urinary bladder on the left side which has a small 6 mm filling defect. While this could simply represent some retained debris, the possibility of a small urothelial neoplasm should be considered, and nonemergent Urologic consultation is recommended  in the near future to better evaluate this finding.** 4. Aortic atherosclerosis, in addition to left main and 3 vessel coronary artery disease. Assessment for potential risk factor modification, dietary therapy or pharmacologic therapy may be warranted, if clinically indicated. 5. Colonic diverticulosis without evidence of acute diverticulitis at this time. 6. Additional incidental findings, as above.   Electronically Signed   By: Vinnie Langton M.D.   On: 05/22/2016 11:37   Impression:  Patient has stage D severe symptomatic aortic stenosis. He describes progressive fatigue, decreased energy, poor appetite, weight loss, and mild exertional shortness of breath despite the fact that he is not very active physically because he is severely limited by his severe degenerative arthritis, all of which is consistent with chronic diastolic congestive heart failure New [place] Heart Association functional class II. He has also been having fairly frequent dizzy spells without syncope.  I have personally reviewed the patient's recent transthoracic echocardiogram and CT angiograms. Echocardiogram confirms the presence of a functionally bicuspid aortic valve with severe aortic stenosis. There is severe calcification, thickening, and restricted leaflet mobility involving all of the leaflets. Peak velocity across the aortic valve measured well above 4 m/s corresponding to mean transvalvular gradient estimated 45 mmHg. Left ventricular systolic function remains normal. I agree the patient needs aortic valve replacement. Diagnostic cardiac catheterization has not yet been performed to assess the coronary circulation. Risks associated with conventional surgery  would be anticipated to be relatively low, although I feel that risks would be moderately elevated and probably considerably higher than those predicted using the STS risk calculator because of the patient's severely limited physical mobility as well as his reported cognitive dysfunction. He describes history consistent with mild dementia and he is very concerned by his problems with short-term memory difficulty. Under the circumstances, it might be reasonable to consider transcatheter aortic valve replacement as an alternative to conventional surgery.  Cardiac gated CT angiogram of the heart confirms the presence of a functional bicuspid aortic valve with findings consistent with severe aortic stenosis and characteristics suitable for transcatheter aortic valve replacement without any significant, getting features. There is question raised about the possibility of a small coronary AV fistula which can be readdressed at the time of catheterization. CT angiogram of the aorta and iliac vessels reveals the patient has adequate pelvic vascular access to facilitate transfemoral approach for transcatheter aortic valve replacement.   Plan:  The patient was counseled at length regarding treatment alternatives for management of severe symptomatic aortic stenosis. Alternative approaches such as conventional aortic valve replacement, transcatheter aortic valve replacement, and long term palliative medical therapy were compared and contrasted at length.  The risks associated with conventional surgical aortic valve replacement were been discussed in detail, as were expectations for post-operative convalescence using either full median sternotomy or minimally invasive techniques.  This was contrasted with the patient's expected recovery following transcatheter aortic valve replacement, and issues specific to transcatheter aortic valve replacement were discussed including questions about long-term valve durability, the  potential for perivalvular leak, and slightly increased risk of need for permanent pacemaker placement. Long-term prognosis with medical therapy was discussed. This discussion was placed in the context of the patient's own specific clinical presentation and past medical history.  All of his questions been addressed.  At this point we await results of the patient's diagnostic cardiac catheterization scheduled for later this week. The presence of significant coronary artery disease and/or a physiologically significant coronary AV fistula may affect decisions regarding treatment options for management  of the patient's aortic stenosis. His case will be reviewed by a multidisciplinary team at specialists once his catheterization has been completed.   I spent in excess of 90 minutes during the conduct of this office consultation and >50% of this time involved direct face-to-face encounter with the patient for counseling and/or coordination of their care.    Valentina Gu. Roxy Manns, MD 05/27/2016 4:13 PM

## 2016-05-29 ENCOUNTER — Ambulatory Visit (HOSPITAL_COMMUNITY)
Admission: RE | Admit: 2016-05-29 | Discharge: 2016-05-29 | Disposition: A | Discharge status: Home / Self Care | Payer: BC Managed Care – PPO | Source: Ambulatory Visit | Attending: Cardiovascular Disease | Admitting: Cardiovascular Disease

## 2016-05-29 ENCOUNTER — Encounter (HOSPITAL_COMMUNITY)
Admission: RE | Disposition: A | Discharge status: Home / Self Care | Payer: Self-pay | Source: Ambulatory Visit | Attending: Cardiovascular Disease

## 2016-05-29 DIAGNOSIS — H409 Unspecified glaucoma: Secondary | ICD-10-CM | POA: Insufficient documentation

## 2016-05-29 DIAGNOSIS — Z7982 Long term (current) use of aspirin: Secondary | ICD-10-CM | POA: Insufficient documentation

## 2016-05-29 DIAGNOSIS — I2510 Atherosclerotic heart disease of native coronary artery without angina pectoris: Secondary | ICD-10-CM

## 2016-05-29 DIAGNOSIS — Z87891 Personal history of nicotine dependence: Secondary | ICD-10-CM | POA: Insufficient documentation

## 2016-05-29 DIAGNOSIS — I350 Nonrheumatic aortic (valve) stenosis: Secondary | ICD-10-CM

## 2016-05-29 DIAGNOSIS — I2584 Coronary atherosclerosis due to calcified coronary lesion: Secondary | ICD-10-CM | POA: Insufficient documentation

## 2016-05-29 DIAGNOSIS — Z8249 Family history of ischemic heart disease and other diseases of the circulatory system: Secondary | ICD-10-CM | POA: Insufficient documentation

## 2016-05-29 DIAGNOSIS — K7689 Other specified diseases of liver: Secondary | ICD-10-CM | POA: Diagnosis not present

## 2016-05-29 DIAGNOSIS — N400 Enlarged prostate without lower urinary tract symptoms: Secondary | ICD-10-CM | POA: Insufficient documentation

## 2016-05-29 DIAGNOSIS — N270 Small kidney, unilateral: Secondary | ICD-10-CM | POA: Insufficient documentation

## 2016-05-29 DIAGNOSIS — I083 Combined rheumatic disorders of mitral, aortic and tricuspid valves: Secondary | ICD-10-CM | POA: Diagnosis not present

## 2016-05-29 DIAGNOSIS — E785 Hyperlipidemia, unspecified: Secondary | ICD-10-CM | POA: Diagnosis not present

## 2016-05-29 DIAGNOSIS — M1612 Unilateral primary osteoarthritis, left hip: Secondary | ICD-10-CM | POA: Diagnosis not present

## 2016-05-29 DIAGNOSIS — I10 Essential (primary) hypertension: Secondary | ICD-10-CM | POA: Diagnosis not present

## 2016-05-29 HISTORY — PX: INTRAVASCULAR PRESSURE WIRE/FFR STUDY: CATH118243

## 2016-05-29 HISTORY — PX: RIGHT/LEFT HEART CATH AND CORONARY ANGIOGRAPHY: CATH118266

## 2016-05-29 LAB — BASIC METABOLIC PANEL
ANION GAP: 7 (ref 5–15)
BUN: 13 mg/dL (ref 6–20)
CO2: 31 mmol/L (ref 22–32)
Calcium: 9.6 mg/dL (ref 8.9–10.3)
Chloride: 97 mmol/L — ABNORMAL LOW (ref 101–111)
Creatinine, Ser: 0.66 mg/dL (ref 0.61–1.24)
GFR calc Af Amer: >60 mL/min (ref >60)
Glucose, Bld: 106 mg/dL — ABNORMAL HIGH (ref 65–99)
POTASSIUM: 5.2 mmol/L — AB (ref 3.5–5.1)
SODIUM: 135 mmol/L (ref 135–145)

## 2016-05-29 LAB — POCT I-STAT 3, ART BLOOD GAS (G3+)
Acid-Base Excess: 2 mmol/L (ref 0.0–2.0)
Bicarbonate: 28.1 mmol/L — ABNORMAL HIGH (ref 20.0–28.0)
O2 Saturation: 98 %
PH ART: 7.387 (ref 7.350–7.450)
PO2 ART: 99 mmHg (ref 83.0–108.0)
TCO2: 30 mmol/L (ref 0–100)
pCO2 arterial: 46.8 mmHg (ref 32.0–48.0)

## 2016-05-29 LAB — POCT I-STAT 3, VENOUS BLOOD GAS (G3P V)
ACID-BASE EXCESS: 2 mmol/L (ref 0.0–2.0)
Acid-Base Excess: 3 mmol/L — ABNORMAL HIGH (ref 0.0–2.0)
Bicarbonate: 29 mmol/L — ABNORMAL HIGH (ref 20.0–28.0)
Bicarbonate: 29.8 mmol/L — ABNORMAL HIGH (ref 20.0–28.0)
O2 Saturation: 75 %
O2 Saturation: 75 %
PCO2 VEN: 51.6 mmHg (ref 44.0–60.0)
PH VEN: 7.352 (ref 7.250–7.430)
PH VEN: 7.37 (ref 7.250–7.430)
PO2 VEN: 42 mmHg (ref 32.0–45.0)
TCO2: 31 mmol/L (ref 0–100)
TCO2: 31 mmol/L (ref 0–100)
pCO2, Ven: 52.3 mmHg (ref 44.0–60.0)
pO2, Ven: 42 mmHg (ref 32.0–45.0)

## 2016-05-29 LAB — CBC
HCT: 43.6 % (ref 39.0–52.0)
Hemoglobin: 14.3 g/dL (ref 13.0–17.0)
MCH: 30.9 pg (ref 26.0–34.0)
MCHC: 32.8 g/dL (ref 30.0–36.0)
MCV: 94.2 fL (ref 78.0–100.0)
PLATELETS: 268 10*3/uL (ref 150–400)
RBC: 4.63 MIL/uL (ref 4.22–5.81)
RDW: 13 % (ref 11.5–15.5)
WBC: 8.2 10*3/uL (ref 4.0–10.5)

## 2016-05-29 LAB — POCT ACTIVATED CLOTTING TIME: ACTIVATED CLOTTING TIME: 235 s

## 2016-05-29 LAB — PROTIME-INR
INR: 0.98
Prothrombin Time: 13 seconds (ref 11.4–15.2)

## 2016-05-29 SURGERY — RIGHT/LEFT HEART CATH AND CORONARY ANGIOGRAPHY
Anesthesia: LOCAL

## 2016-05-29 MED ORDER — ASPIRIN 81 MG PO CHEW: 81.0000 mg | CHEWABLE_TABLET | ORAL | Status: DC

## 2016-05-29 MED ORDER — NITROGLYCERIN 1 MG/10 ML FOR IR/CATH LAB
INTRA_ARTERIAL | Status: AC
  Filled 2016-05-29: qty 10

## 2016-05-29 MED ORDER — IOPAMIDOL (ISOVUE-370) INJECTION 76%
INTRAVENOUS | Status: DC | PRN
  Administered 2016-05-29: 90 mL via INTRA_ARTERIAL

## 2016-05-29 MED ORDER — MIDAZOLAM HCL 2 MG/2ML IJ SOLN
INTRAMUSCULAR | Status: AC
  Filled 2016-05-29: qty 2

## 2016-05-29 MED ORDER — IOPAMIDOL (ISOVUE-370) INJECTION 76%
INTRAVENOUS | Status: AC
  Filled 2016-05-29: qty 100

## 2016-05-29 MED ORDER — ONDANSETRON HCL 4 MG/2ML IJ SOLN: 4.0000 mg | Freq: Four times a day (QID) | INTRAMUSCULAR | Status: DC | PRN

## 2016-05-29 MED ORDER — HEPARIN SODIUM (PORCINE) 1000 UNIT/ML IJ SOLN
INTRAMUSCULAR | Status: AC
  Filled 2016-05-29: qty 1

## 2016-05-29 MED ORDER — VERAPAMIL HCL 2.5 MG/ML IV SOLN
INTRAVENOUS | Status: AC
  Filled 2016-05-29: qty 2

## 2016-05-29 MED ORDER — VERAPAMIL HCL 2.5 MG/ML IV SOLN
INTRAVENOUS | Status: DC | PRN
  Administered 2016-05-29: 10 mL via INTRA_ARTERIAL

## 2016-05-29 MED ORDER — SODIUM CHLORIDE 0.9 % IV SOLN: 250.0000 mL | INTRAVENOUS | Status: DC | PRN

## 2016-05-29 MED ORDER — MIDAZOLAM HCL 2 MG/2ML IJ SOLN
INTRAMUSCULAR | Status: DC | PRN
  Administered 2016-05-29: 2 mg via INTRAVENOUS

## 2016-05-29 MED ORDER — ADENOSINE 12 MG/4ML IV SOLN
INTRAVENOUS | Status: AC
  Filled 2016-05-29: qty 16

## 2016-05-29 MED ORDER — LIDOCAINE HCL (PF) 1 % IJ SOLN
INTRAMUSCULAR | Status: AC
  Filled 2016-05-29: qty 30

## 2016-05-29 MED ORDER — FENTANYL CITRATE (PF) 100 MCG/2ML IJ SOLN
INTRAMUSCULAR | Status: AC
  Filled 2016-05-29: qty 2

## 2016-05-29 MED ORDER — SODIUM CHLORIDE 0.9% FLUSH: 3.0000 mL | Freq: Two times a day (BID) | INTRAVENOUS | Status: DC

## 2016-05-29 MED ORDER — FENTANYL CITRATE (PF) 100 MCG/2ML IJ SOLN
INTRAMUSCULAR | Status: DC | PRN
  Administered 2016-05-29: 25 ug via INTRAVENOUS

## 2016-05-29 MED ORDER — SODIUM CHLORIDE 0.9% FLUSH: 3.0000 mL | INTRAVENOUS | Status: DC | PRN

## 2016-05-29 MED ORDER — ACETAMINOPHEN 325 MG PO TABS: 650.0000 mg | ORAL_TABLET | ORAL | Status: DC | PRN

## 2016-05-29 MED ORDER — LIDOCAINE HCL (PF) 1 % IJ SOLN
INTRAMUSCULAR | Status: DC | PRN
  Administered 2016-05-29 (×2): 2 mL

## 2016-05-29 MED ORDER — HEPARIN (PORCINE) IN NACL 2-0.9 UNIT/ML-% IJ SOLN
INTRAMUSCULAR | Status: AC
Start: 2016-05-29 — End: ?
  Filled 2016-05-29: qty 1000

## 2016-05-29 MED ORDER — SODIUM CHLORIDE 0.9 % WEIGHT BASED INFUSION: 1.0000 mL/kg/h | INTRAVENOUS | Status: DC

## 2016-05-29 MED ORDER — HEPARIN (PORCINE) IN NACL 2-0.9 UNIT/ML-% IJ SOLN
INTRAMUSCULAR | Status: AC | PRN
  Administered 2016-05-29: 1000 mL

## 2016-05-29 MED ORDER — HEPARIN SODIUM (PORCINE) 1000 UNIT/ML IJ SOLN
INTRAMUSCULAR | Status: DC | PRN
  Administered 2016-05-29: 4000 [IU] via INTRAVENOUS
  Administered 2016-05-29: 2000 [IU] via INTRAVENOUS
  Administered 2016-05-29: 1500 [IU] via INTRAVENOUS

## 2016-05-29 MED ORDER — SODIUM CHLORIDE 0.9 % WEIGHT BASED INFUSION
3.0000 mL/kg/h | INTRAVENOUS | Status: AC
  Administered 2016-05-29: 3 mL/kg/h via INTRAVENOUS

## 2016-05-29 MED ORDER — NITROGLYCERIN 1 MG/10 ML FOR IR/CATH LAB
INTRA_ARTERIAL | Status: DC | PRN
  Administered 2016-05-29: 200 ug via INTRACORONARY

## 2016-05-29 MED ORDER — SODIUM CHLORIDE 0.9 % WEIGHT BASED INFUSION
1.0000 mL/kg/h | INTRAVENOUS | Status: DC
  Administered 2016-05-29: 1 mL/kg/h via INTRAVENOUS

## 2016-05-29 MED ORDER — ADENOSINE (DIAGNOSTIC) 140MCG/KG/MIN
INTRAVENOUS | Status: DC | PRN
  Administered 2016-05-29: 140 ug/kg/min via INTRAVENOUS

## 2016-05-29 SURGICAL SUPPLY — 19 items
CATH 5FR JL3.5 JR4 ANG PIG MP (CATHETERS) ×2 IMPLANT
CATH BALLN WEDGE 5F 110CM (CATHETERS) ×2 IMPLANT
CATH EXPO 5FR AL1 (CATHETERS) ×2 IMPLANT
CATH LAUNCHER 5F EBU3.5 (CATHETERS) ×2 IMPLANT
CATH LAUNCHER 5F JR4 (CATHETERS) ×2 IMPLANT
DEVICE RAD COMP TR BAND LRG (VASCULAR PRODUCTS) ×2 IMPLANT
GLIDESHEATH SLEND SS 6F .021 (SHEATH) ×2 IMPLANT
GUIDEWIRE .025 260CM (WIRE) ×2 IMPLANT
GUIDEWIRE INQWIRE 1.5J.035X260 (WIRE) ×1 IMPLANT
GUIDEWIRE PRESSURE COMET II (WIRE) ×2 IMPLANT
INQWIRE 1.5J .035X260CM (WIRE) ×2
KIT ESSENTIALS PG (KITS) ×2 IMPLANT
KIT HEART LEFT (KITS) ×2 IMPLANT
PACK CARDIAC CATHETERIZATION (CUSTOM PROCEDURE TRAY) ×2 IMPLANT
SHEATH GLIDE SLENDER 4/5FR (SHEATH) ×2 IMPLANT
SYR MEDRAD MARK V 150ML (SYRINGE) ×2 IMPLANT
TRANSDUCER W/STOPCOCK (MISCELLANEOUS) ×2 IMPLANT
TUBING CIL FLEX 10 FLL-RA (TUBING) ×2 IMPLANT
WIRE HI TORQ VERSACORE-J 145CM (WIRE) ×2 IMPLANT

## 2016-05-29 NOTE — H&P (View-Only) (Signed)
Cardiology Office Note Date:  05/13/2016   ID:  [name] [name], DOB [birth year], MRN [id number]  PCP:  [name], MD  Cardiologist:  [name], MD    Chief Complaint  Patient presents with  . TAVR consult     History of Present Illness: [name] [name] is a 81 y.o. male who presents for evaluation of severe aortic stenosis, referred by Dr Radford Pax.  Reports he was first noted to have a heart murmur approximately 4 years ago and he has been followed by Dr Radford Pax since that time. He has a hx of both MVP with mild MR and aortic stenosis with bicuspid aortic valve.   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 He has been active in the past but now is quite limited by pain. He is followed by Dr Wynelle Link who has recommended a total hip replacement. The patient has difficulty ambulating and is using a cane.   He is here alone today. Feels very fatigued, especially in the afternoons. He was previously active and able to hike in the woods as recently as 2 years ago without any symptoms. Now is unable to swim or do much walking at all because of his hip problem. He specifically denies chest pain, chest pressure, or shortness of breath. Reports some lightheadedness, no syncope. Feels unsteady on his feet.   The patient works as a Animal nutritionist at [employer]. He is a widower since his wife passed away in 05-12-2007.   Past Medical History:  Diagnosis Date  . Actinic keratoses    Dr Glee Arvin  . Allergic rhinitis   . Aortic valve stenosis, moderate   . Benign essential HTN   . Bicuspid aortic valve   . Bilateral cataracts   . BPH (benign prostatic hypertrophy)   . Dilated aortic root ([place])    13mm by echo 08/2015  . Glaucoma   . H/O seasonal allergies   . Hx of adenomatous colonic polyps 1991 on  . Hx of cardiovascular stress test    Myoview (10/15):  normal  . Hyperlipidemia   . Mitral valve prolapse    mild MR by echo  08/2015  . Paresthesia    Chronic left lateral thigh    Past Surgical History:  Procedure Laterality Date  . COLONOSCOPY  multiple  . TONSILLECTOMY  1949    Current Outpatient Prescriptions  Medication Sig Dispense Refill  . Ascorbic Acid (VITAMIN C) 1000 MG tablet Take 1,000 mg by mouth daily.    Marland Kitchen aspirin 81 MG tablet Take 81 mg by mouth daily.    . fish oil-omega-3 fatty acids 1000 MG capsule Take 1 g by mouth daily.     Marland Kitchen glucosamine-chondroitin 500-400 MG tablet Take 1 tablet by mouth 3 (three) times daily.    Marland Kitchen latanoprost (XALATAN) 0.005 % ophthalmic solution Place 1 drop into both eyes at bedtime.    Marland Kitchen losartan (COZAAR) 100 MG tablet Take 100 mg by mouth daily.    . pravastatin (PRAVACHOL) 40 MG tablet Take 40 mg by mouth daily.     . timolol (BETIMOL) 0.25 % ophthalmic solution Place 1-2 drops into both eyes daily.      No current facility-administered medications for this visit.     Allergies:   Patient has no known allergies.   Social History:  The patient  reports that he quit smoking about 55 years ago. He has never used smokeless tobacco. He reports  that he drinks about 3.0 oz of alcohol per week . He reports that he does not use drugs.   Family History:  The patient's family history includes Emphysema in his mother; Heart attack in his brother; Heart disease in his brother; Pancreatic cancer in his father.   ROS:  Please see the history of present illness.  Otherwise, review of systems is positive for leg pain.  All other systems are reviewed and negative.   PHYSICAL EXAM: VS:  BP (!) 146/70   Pulse 60   Ht 5' 9.5" (1.765 m)   Wt 135 lb 9.6 oz (61.5 kg)   BMI 19.74 kg/m  , BMI Body mass index is 19.74 kg/m. GEN: Thin, elderly male, in no acute distress  HEENT: normal  Neck: no JVD, no masses. bilateral carotid bruits Cardiac: RRR with 3/6 harsh mid-peaking systolic murmur at the RUSB, A2 is audible              Respiratory:  clear to auscultation  bilaterally, normal work of breathing GI: soft, nontender, nondistended, + BS MS: no deformity or atrophy  Ext: no pretibial edema, pedal pulses 2+= bilaterally Skin: warm and dry, no rash Neuro:  Strength and sensation are intact Psych: euthymic mood, full affect  EKG:  EKG is ordered today. The ekg ordered today shows NSR 61 bpm, LAE, LAFB  Recent Labs: 09/21/2015: BUN 18; Creat 0.74; Potassium 4.8; Sodium 130   Lipid Panel  No results found for: CHOL, TRIG, HDL, CHOLHDL, VLDL, LDLCALC, LDLDIRECT    Wt Readings from Last 3 Encounters:  05/13/16 135 lb 9.6 oz (61.5 kg)  09/21/15 147 lb (66.7 kg)  08/14/15 149 lb (67.6 kg)     Cardiac Studies Reviewed: CTA Chest 09/28/2015: IMPRESSION: 1. No thoracic aortic dissection.  Normal caliber thoracic aorta. 2. 6 mm hypodense mass in the mid pancreatic body. Further evaluation with MRI of the abdomen is recommended for better characterization.  MR Abdomen: IMPRESSION: 1. No acute findings identified within the upper abdomen. 2. Benign appearing fluid intensity structure within the body of pancreas is identified without associated enhancement. This is favored to represent a benign etiology such as a small pseudocyst or benign cystic neoplasm of the pancreas. Followup imaging at 12 months advised to ensure stability. 3. Small kidney and liver cysts.  Echo 05/07/2016: Left ventricle:  The cavity size was normal. Wall thickness was increased in a pattern of mild LVH. Systolic function was normal. The estimated ejection fraction was in the range of 60% to 65%. Wall motion was normal; there were no regional wall motion abnormalities. Doppler parameters are consistent with abnormal left ventricular relaxation (grade 1 diastolic dysfunction).  ------------------------------------------------------------------- Aortic valve:   Mildly calcified annulus. Severely thickened, moderately calcified leaflets.  Doppler:   There was  severe stenosis.   There was mild regurgitation.    VTI ratio of LVOT to aortic valve: 0.24. Valve area (VTI): 0.93 cm^2. Indexed valve area (VTI): 0.52 cm^2/m^2. Peak velocity ratio of LVOT to aortic valve: 0.25. Valve area (Vmax): 0.93 cm^2. Indexed valve area (Vmax): 0.52 cm^2/m^2. Mean velocity ratio of LVOT to aortic valve: 0.24. Valve area (Vmean): 0.9 cm^2. Indexed valve area (Vmean): 0.5 cm^2/m^2.  Mean gradient (S): 45 mm Hg. Peak gradient (S): 76 mm Hg.  ------------------------------------------------------------------- Aorta:  Aortic root: The aortic root was normal in size. Ascending aorta: The ascending aorta was normal in size.  ------------------------------------------------------------------- Mitral valve:   Mildly calcified annulus.  Doppler:  There was trivial regurgitation.  Peak gradient (D): 3 mm Hg.  ------------------------------------------------------------------- Left atrium:  The atrium was severely dilated.  ------------------------------------------------------------------- Right ventricle:  The cavity size was normal. Systolic function was normal.  ------------------------------------------------------------------- Pulmonic valve:    The valve appears to be grossly normal. Doppler:  There was mild regurgitation.  ------------------------------------------------------------------- Tricuspid valve:   Structurally normal valve.   Leaflet separation was normal.  Doppler:  Transvalvular velocity was within the normal range. There was mild regurgitation.  ------------------------------------------------------------------- Pulmonary artery:   Systolic pressure was moderately increased.  ------------------------------------------------------------------- Right atrium:  The atrium was mildly dilated.  ------------------------------------------------------------------- Pericardium:  A small, free-flowing pericardial effusion was identified  circumferential to the heart.  ------------------------------------------------------------------- Systemic veins: Inferior vena cava: The vessel was normal in size. The respirophasic diameter changes were in the normal range (>= 50%), consistent with normal central venous pressure.  ------------------------------------------------------------------- Measurements   Left ventricle                            Value          Reference  LV ID, ED, PLAX chordal           (L)     42.8  mm       43 - 52  LV ID, ES, PLAX chordal                   24.6  mm       23 - 38  LV fx shortening, PLAX chordal            43    %        >=29  LV PW thickness, ED                       12    mm       ---------  IVS/LV PW ratio, ED                       1.08           <=1.3  Stroke volume, 2D                         100   ml       ---------  Stroke volume/bsa, 2D                     56    ml/m^2   ---------  LV e&', lateral                            7.02  cm/s     ---------  LV E/e&', lateral                          13.15          ---------  LV e&', medial                             6.36  cm/s     ---------  LV E/e&', medial                           14.51          ---------  LV e&', average                            6.69  cm/s     ---------  LV E/e&', average                          13.8           ---------    Ventricular septum                        Value          Reference  IVS thickness, ED                         13    mm       ---------    LVOT                                      Value          Reference  LVOT ID, S                                22    mm       ---------  LVOT area                                 3.8   cm^2     ---------  LVOT ID                                   22    mm       ---------  LVOT peak velocity, S                     107   cm/s     ---------  LVOT mean velocity, S                     75.9  cm/s     ---------  LVOT VTI, S                               26.2  cm        ---------  LVOT peak gradient, S                     5     mm Hg    ---------  Stroke volume (SV), LVOT DP               99.6  ml       ---------  Stroke index (SV/bsa), LVOT DP            55.3  ml/m^2   ---------    Aortic valve                              Value          Reference  Aortic valve peak  velocity, S             436   cm/s     ---------  Aortic valve mean velocity, S             319   cm/s     ---------  Aortic valve VTI, S                       107   cm       ---------  Aortic mean gradient, S                   45    mm Hg    ---------  Aortic peak gradient, S                   76    mm Hg    ---------  VTI ratio, LVOT/AV                        0.24           ---------  Aortic valve area, VTI                    0.93  cm^2     ---------  Aortic valve area/bsa, VTI                0.52  cm^2/m^2 ---------  Velocity ratio, peak, LVOT/AV             0.25           ---------  Aortic valve area, peak velocity          0.93  cm^2     ---------  Aortic valve area/bsa, peak               0.52  cm^2/m^2 ---------  velocity  Velocity ratio, mean, LVOT/AV             0.24           ---------  Aortic valve area, mean velocity          0.9   cm^2     ---------  Aortic valve area/bsa, mean               0.5   cm^2/m^2 ---------  velocity  Aortic regurg pressure half-time          555   ms       ---------    Aorta                                     Value          Reference  Aortic root ID, ED                        39    mm       ---------  Ascending aorta ID, A-P, S                33    mm       ---------    Left atrium                               Value          Reference  LA ID, A-P, ES                            41    mm       ---------  LA ID/bsa, A-P                    (H)     2.28  cm/m^2   <=2.2  LA volume, S                              95    ml       ---------  LA volume/bsa, S                          52.8  ml/m^2   ---------  LA volume, ES, 1-p A4C                     97    ml       ---------  LA volume/bsa, ES, 1-p A4C                53.9  ml/m^2   ---------  LA volume, ES, 1-p A2C                    83    ml       ---------  LA volume/bsa, ES, 1-p A2C                46.1  ml/m^2   ---------    Mitral valve                              Value          Reference  Mitral E-wave peak velocity               92.3  cm/s     ---------  Mitral A-wave peak velocity               111   cm/s     ---------  Mitral deceleration time          (H)     289   ms       150 - 230  Mitral peak gradient, D                   3     mm Hg    ---------  Mitral E/A ratio, peak                    0.8            ---------    Pulmonary arteries                        Value          Reference  PA pressure, S, DP                (H)     41    mm Hg    <=30    Tricuspid valve                           Value  Reference  Tricuspid regurg peak velocity            307   cm/s     ---------  Tricuspid peak RV-RA gradient             38    mm Hg    ---------    Right ventricle                           Value          Reference  RV s&', lateral, S                         16.8  cm/s     ---------  Legend: (L)  and  (H)  mark values outside specified reference range.  STS Risk Calculator: Procedure: AV Replacement  Risk of Mortality: 1.873%  Morbidity or Mortality: 13.624%  Long Length of Stay: 4.526%  Short Length of Stay: 45.359%  Permanent Stroke: 1.286%  Prolonged Ventilation: 7.359%  DSW Infection: 0.225%  Renal Failure: 2.917%  Reoperation: 7.145%   ASSESSMENT AND PLAN: 81 yo male with severe bicuspid aortic valve stenosis: difficult to know whether there is any cardiovascular functional limitation because he has become so limited from end-stage osteoarthritis of the left hip. I have reviewed the natural history of aortic stenosis with the patient today. We have discussed the limitations of medical therapy and the poor prognosis associated with symptomatic aortic  stenosis. We have reviewed potential treatment options, including observation with medical therapy, conventional surgical aortic valve replacement, and transcatheter aortic valve replacement. We discussed treatment options in the context of this patient's specific comorbid medical conditions. He has had significant progression of aortic stenosis by echo criteria. I have personally reviewed his echo images and he has clear evidence of severe AS with a peak transvalvular velocity in excess of 4 m/s and mean gradient 47 mmHg. There is mild aortic insufficiency present. While he appears to have a bicuspid valve morphology, he will not need aortic root surgery as his ascending aorta has a maximal diameter of 3.5 cm by CTA last year. Considering his need for hip surgery to allow for him to return to a normal functional status, it appears best to treat his progressive and severe aortic stenosis prior to noncardiac surgery. I have recommended R/L heart catheterization, CT angiography of the heart prior to formal cardiac surgical evaluation. If he ultimately requires conventional surgery, he would be interested in a minimally invasive approach. Will refer him for evaluation by Dr Roxy Manns after his cath and CTA studies are completed.   I have reviewed the risks, indications, and alternatives to cardiac catheterization, possible angioplasty, and stenting with the patient. Risks include but are not limited to bleeding, infection, vascular injury, stroke, myocardial infection, arrhythmia, kidney injury, radiation-related injury in the case of prolonged fluoroscopy use, emergency cardiac surgery, and death. The patient understands the risks of serious complication is 1-2 in 4270 with diagnostic cardiac cath and 1-2% or less with angioplasty/stenting.   Current medicines are reviewed with the patient today.  The patient does not have concerns regarding medicines.  Labs/ tests ordered today include:   Orders Placed This  Encounter  Procedures  . CT ANGIO CHEST AORTA W &/OR WO CONTRAST  . CT Angio Abd/Pel w/ and/or w/o  . CT CORONARY MORPH W/CTA COR W/SCORE W/CA W/CM &/OR WO/CM  . Basic metabolic panel  . Ambulatory  referral to Cardiothoracic Surgery  . EKG 12-Lead    Disposition:   FU pending test results  Signed, [name], MD  05/13/2016 2:37 PM    [address], [place], [place]  [address] Phone: [phone]; Fax: [phone]

## 2016-05-29 NOTE — Interval H&P Note (Signed)
History and Physical Interval Note:  05/29/2016 1:22 PM  [name] [name]  has presented today for surgery, with the diagnosis of as  The various methods of treatment have been discussed with the patient and family. After consideration of risks, benefits and other options for treatment, the patient has consented to  Procedure(s): Right/Left Heart Cath and Coronary Angiography (N/A) as a surgical intervention .  The patient's history has been reviewed, patient examined, no change in status, stable for surgery.  I have reviewed the patient's chart and labs.  Questions were answered to the patient's satisfaction.     [name]

## 2016-05-29 NOTE — Discharge Instructions (Signed)
Radial Site Care °Refer to this sheet in the next few weeks. These instructions provide you with information about caring for yourself after your procedure. Your health care provider may also give you more specific instructions. Your treatment has been planned according to current medical practices, but problems sometimes occur. Call your health care provider if you have any problems or questions after your procedure. °What can I expect after the procedure? °After your procedure, it is typical to have the following: °· Bruising at the radial site that usually fades within 1-2 weeks. °· Blood collecting in the tissue (hematoma) that may be painful to the touch. It should usually decrease in size and tenderness within 1-2 weeks. °Follow these instructions at home: °· Take medicines only as directed by your health care provider. °· You may shower 24-48 hours after the procedure or as directed by your health care provider. Remove the bandage (dressing) and gently wash the site with plain soap and water. Pat the area dry with a clean towel. Do not rub the site, because this may cause bleeding. °· Do not take baths, swim, or use a hot tub until your health care provider approves. °· Check your insertion site every day for redness, swelling, or drainage. °· Do not apply powder or lotion to the site. °· Do not flex or bend the affected arm for 24 hours or as directed by your health care provider. °· Do not push or pull heavy objects with the affected arm for 24 hours or as directed by your health care provider. °· Do not lift over 10 lb (4.5 kg) for 5 days after your procedure or as directed by your health care provider. °· Ask your health care provider when it is okay to: °¨ Return to work or school. °¨ Resume usual physical activities or sports. °¨ Resume sexual activity. °· Do not drive home if you are discharged the same day as the procedure. Have someone else drive you. °· You may drive 24 hours after the procedure  unless otherwise instructed by your health care provider. °· Do not operate machinery or power tools for 24 hours after the procedure. °· If your procedure was done as an outpatient procedure, which means that you went home the same day as your procedure, a responsible adult should be with you for the first 24 hours after you arrive home. °· Keep all follow-up visits as directed by your health care provider. This is important. °Contact a health care provider if: °· You have a fever. °· You have chills. °· You have increased bleeding from the radial site. Hold pressure on the site. °Get help right away if: °· You have unusual pain at the radial site. °· You have redness, warmth, or swelling at the radial site. °· You have drainage (other than a small amount of blood on the dressing) from the radial site. °· The radial site is bleeding, and the bleeding does not stop after 30 minutes of holding steady pressure on the site. °· Your arm or hand becomes pale, cool, tingly, or numb. °This information is not intended to replace advice given to you by your health care provider. Make sure you discuss any questions you have with your health care provider. °Document Released: 02/02/2010 Document Revised: 06/08/2015 Document Reviewed: 07/19/2013 °Elsevier Interactive Patient Education © 2017 [employer] ° °

## 2016-05-30 ENCOUNTER — Encounter: Payer: BC Managed Care – PPO | Admitting: Thoracic Surgery (Cardiothoracic Vascular Surgery)

## 2016-05-30 ENCOUNTER — Ambulatory Visit (HOSPITAL_COMMUNITY)
Admission: RE | Admit: 2016-05-30 | Discharge: 2016-05-30 | Disposition: A | Discharge status: Home / Self Care | Payer: BC Managed Care – PPO | Source: Ambulatory Visit | Attending: Cardiovascular Disease | Admitting: Cardiovascular Disease

## 2016-05-30 ENCOUNTER — Encounter (HOSPITAL_COMMUNITY): Payer: Self-pay | Admitting: Cardiovascular Disease

## 2016-05-30 DIAGNOSIS — I350 Nonrheumatic aortic (valve) stenosis: Secondary | ICD-10-CM | POA: Diagnosis not present

## 2016-05-30 LAB — PULMONARY FUNCTION TEST
DL/VA % PRED: 82 %
DL/VA: 3.73 ml/min/mmHg/L
DLCO UNC % PRED: 61 %
DLCO unc: 19 ml/min/mmHg
FEF 25-75 POST: 2.63 L/s
FEF 25-75 Pre: 1.14 L/sec
FEF2575-%Change-Post: 130 %
FEF2575-%PRED-PRE: 58 %
FEF2575-%Pred-Post: 134 %
FEV1-%Change-Post: 25 %
FEV1-%Pred-Post: 93 %
FEV1-%Pred-Pre: 74 %
FEV1-PRE: 2.07 L
FEV1-Post: 2.61 L
FEV1FVC-%CHANGE-POST: 21 %
FEV1FVC-%PRED-PRE: 88 %
FEV6-%Change-Post: 7 %
FEV6-%Pred-Post: 92 %
FEV6-%Pred-Pre: 85 %
FEV6-POST: 3.37 L
FEV6-Pre: 3.13 L
FEV6FVC-%Change-Post: 1 %
FEV6FVC-%PRED-POST: 107 %
FEV6FVC-%Pred-Pre: 106 %
FVC-%Change-Post: 3 %
FVC-%PRED-PRE: 82 %
FVC-%Pred-Post: 85 %
FVC-POST: 3.37 L
FVC-PRE: 3.24 L
PRE FEV1/FVC RATIO: 64 %
Post FEV1/FVC ratio: 78 %
Post FEV6/FVC ratio: 100 %
Pre FEV6/FVC Ratio: 99 %
RV % pred: 101 %
RV: 2.6 L
TLC % PRED: 85 %
TLC: 5.83 L

## 2016-05-30 LAB — VAS US CAROTID
LCCADDIAS: 16 cm/s
LCCADSYS: 82 cm/s
LEFT ECA DIAS: -19 cm/s
LEFT VERTEBRAL DIAS: 25 cm/s
LICADDIAS: -34 cm/s
LICADSYS: -118 cm/s
LICAPDIAS: -72 cm/s
Left CCA prox dias: 27 cm/s
Left CCA prox sys: 129 cm/s
Left ICA prox sys: -227 cm/s
RCCAPSYS: 97 cm/s
RIGHT ECA DIAS: -7 cm/s
RIGHT VERTEBRAL DIAS: 29 cm/s
Right CCA prox dias: 22 cm/s
Right cca dist sys: -117 cm/s

## 2016-05-30 MED ORDER — ALBUTEROL SULFATE (2.5 MG/3ML) 0.083% IN NEBU
2.5000 mg | INHALATION_SOLUTION | Freq: Once | RESPIRATORY_TRACT | Status: AC
  Administered 2016-05-30: 2.5 mg via RESPIRATORY_TRACT

## 2016-05-30 NOTE — Progress Notes (Signed)
*  PRELIMINARY RESULTS* Vascular Ultrasound Carotid Duplex (Doppler) has been completed.  Preliminary findings: Right 40-59% ICA stenosis. Left 60-79% ICA stenosis. Antegrade vertebral flow.    Landry Mellow, RDMS, RVT  05/30/2016, 1:59 PM

## 2016-06-03 ENCOUNTER — Encounter: Payer: BC Managed Care – PPO | Admitting: Thoracic Surgery (Cardiothoracic Vascular Surgery)

## 2016-06-03 ENCOUNTER — Other Ambulatory Visit: Payer: Self-pay

## 2016-06-03 DIAGNOSIS — I350 Nonrheumatic aortic (valve) stenosis: Secondary | ICD-10-CM

## 2016-06-03 DIAGNOSIS — I2510 Atherosclerotic heart disease of native coronary artery without angina pectoris: Secondary | ICD-10-CM

## 2016-06-03 MED ORDER — CLOPIDOGREL BISULFATE 75 MG PO TABS: 75.0000 mg | ORAL_TABLET | Freq: Every day | ORAL | 11 refills | Status: DC

## 2016-06-04 ENCOUNTER — Other Ambulatory Visit: Payer: BC Managed Care – PPO

## 2016-06-04 DIAGNOSIS — I350 Nonrheumatic aortic (valve) stenosis: Secondary | ICD-10-CM

## 2016-06-04 DIAGNOSIS — I2510 Atherosclerotic heart disease of native coronary artery without angina pectoris: Secondary | ICD-10-CM

## 2016-06-05 ENCOUNTER — Other Ambulatory Visit: Payer: Self-pay | Admitting: Cardiovascular Disease

## 2016-06-05 DIAGNOSIS — R9439 Abnormal result of other cardiovascular function study: Secondary | ICD-10-CM

## 2016-06-05 LAB — BASIC METABOLIC PANEL
BUN / CREAT RATIO: 19 (ref 10–24)
BUN: 13 mg/dL (ref 8–27)
CALCIUM: 9.6 mg/dL (ref 8.6–10.2)
CO2: 27 mmol/L (ref 18–29)
Chloride: 95 mmol/L — ABNORMAL LOW (ref 96–106)
Creatinine, Ser: 0.69 mg/dL — ABNORMAL LOW (ref 0.76–1.27)
GFR calc Af Amer: 105 mL/min/{1.73_m2} (ref >59)
GFR calc non Af Amer: 91 mL/min/{1.73_m2} (ref >59)
GLUCOSE: 95 mg/dL (ref 65–99)
POTASSIUM: 5.2 mmol/L (ref 3.5–5.2)
Sodium: 136 mmol/L (ref 134–144)

## 2016-06-05 LAB — CBC
HEMOGLOBIN: 13.6 g/dL (ref 13.0–17.7)
Hematocrit: 41.6 % (ref 37.5–51.0)
MCH: 30.5 pg (ref 26.6–33.0)
MCHC: 32.7 g/dL (ref 31.5–35.7)
MCV: 93 fL (ref 79–97)
Platelets: 286 10*3/uL (ref 150–379)
RBC: 4.46 x10E6/uL (ref 4.14–5.80)
RDW: 13.6 % (ref 12.3–15.4)
WBC: 6.7 10*3/uL (ref 3.4–10.8)

## 2016-06-05 LAB — PROTIME-INR
INR: 1 (ref 0.8–1.2)
Prothrombin Time: 10.7 s (ref 9.1–12.0)

## 2016-06-06 ENCOUNTER — Other Ambulatory Visit: Payer: Self-pay | Admitting: *Deleted

## 2016-06-06 ENCOUNTER — Encounter (HOSPITAL_COMMUNITY): Payer: Self-pay | Admitting: Cardiovascular Disease

## 2016-06-06 ENCOUNTER — Encounter (HOSPITAL_COMMUNITY)
Admission: RE | Disposition: A | Discharge status: Home / Self Care | Payer: Self-pay | Source: Ambulatory Visit | Attending: Cardiovascular Disease

## 2016-06-06 ENCOUNTER — Ambulatory Visit (HOSPITAL_COMMUNITY)
Admission: RE | Admit: 2016-06-06 | Discharge: 2016-06-06 | Disposition: A | Discharge status: Home / Self Care | Payer: BC Managed Care – PPO | Source: Ambulatory Visit | Attending: Cardiovascular Disease | Admitting: Cardiovascular Disease

## 2016-06-06 DIAGNOSIS — I2510 Atherosclerotic heart disease of native coronary artery without angina pectoris: Secondary | ICD-10-CM | POA: Insufficient documentation

## 2016-06-06 DIAGNOSIS — Z7982 Long term (current) use of aspirin: Secondary | ICD-10-CM | POA: Diagnosis not present

## 2016-06-06 DIAGNOSIS — N400 Enlarged prostate without lower urinary tract symptoms: Secondary | ICD-10-CM | POA: Insufficient documentation

## 2016-06-06 DIAGNOSIS — M1612 Unilateral primary osteoarthritis, left hip: Secondary | ICD-10-CM | POA: Diagnosis not present

## 2016-06-06 DIAGNOSIS — Z87891 Personal history of nicotine dependence: Secondary | ICD-10-CM | POA: Insufficient documentation

## 2016-06-06 DIAGNOSIS — Z8249 Family history of ischemic heart disease and other diseases of the circulatory system: Secondary | ICD-10-CM | POA: Diagnosis not present

## 2016-06-06 DIAGNOSIS — I10 Essential (primary) hypertension: Secondary | ICD-10-CM | POA: Insufficient documentation

## 2016-06-06 DIAGNOSIS — H409 Unspecified glaucoma: Secondary | ICD-10-CM | POA: Insufficient documentation

## 2016-06-06 DIAGNOSIS — E785 Hyperlipidemia, unspecified: Secondary | ICD-10-CM | POA: Insufficient documentation

## 2016-06-06 DIAGNOSIS — I2584 Coronary atherosclerosis due to calcified coronary lesion: Secondary | ICD-10-CM | POA: Insufficient documentation

## 2016-06-06 DIAGNOSIS — R9439 Abnormal result of other cardiovascular function study: Secondary | ICD-10-CM

## 2016-06-06 DIAGNOSIS — I083 Combined rheumatic disorders of mitral, aortic and tricuspid valves: Secondary | ICD-10-CM | POA: Insufficient documentation

## 2016-06-06 DIAGNOSIS — I350 Nonrheumatic aortic (valve) stenosis: Secondary | ICD-10-CM

## 2016-06-06 HISTORY — PX: CORONARY STENT INTERVENTION: CATH118234

## 2016-06-06 LAB — POCT ACTIVATED CLOTTING TIME
Activated Clotting Time: 246 seconds
Activated Clotting Time: 257 seconds

## 2016-06-06 SURGERY — CORONARY STENT INTERVENTION
Anesthesia: LOCAL

## 2016-06-06 MED ORDER — SODIUM CHLORIDE 0.9 % WEIGHT BASED INFUSION: 1.0000 mL/kg/h | INTRAVENOUS | Status: DC

## 2016-06-06 MED ORDER — SODIUM CHLORIDE 0.9% FLUSH: 3.0000 mL | Freq: Two times a day (BID) | INTRAVENOUS | Status: DC

## 2016-06-06 MED ORDER — ASPIRIN 81 MG PO TABS: 81.0000 mg | ORAL_TABLET | Freq: Every evening | ORAL | Status: DC

## 2016-06-06 MED ORDER — ANGIOPLASTY BOOK
Freq: Once | Status: DC
  Filled 2016-06-06: qty 1

## 2016-06-06 MED ORDER — SODIUM CHLORIDE 0.9 % IV SOLN: 250.0000 mL | INTRAVENOUS | Status: DC | PRN

## 2016-06-06 MED ORDER — MIDAZOLAM HCL 2 MG/2ML IJ SOLN
INTRAMUSCULAR | Status: AC
  Filled 2016-06-06: qty 2

## 2016-06-06 MED ORDER — NITROGLYCERIN 1 MG/10 ML FOR IR/CATH LAB
INTRA_ARTERIAL | Status: AC
  Filled 2016-06-06: qty 10

## 2016-06-06 MED ORDER — IOPAMIDOL (ISOVUE-370) INJECTION 76%
INTRAVENOUS | Status: AC
  Filled 2016-06-06: qty 125

## 2016-06-06 MED ORDER — TIMOLOL HEMIHYDRATE 0.5 % OP SOLN: 1.0000 [drp] | Freq: Two times a day (BID) | OPHTHALMIC | Status: DC

## 2016-06-06 MED ORDER — SODIUM CHLORIDE 0.9% FLUSH: 3.0000 mL | INTRAVENOUS | Status: DC | PRN

## 2016-06-06 MED ORDER — HEPARIN SODIUM (PORCINE) 1000 UNIT/ML IJ SOLN
INTRAMUSCULAR | Status: DC | PRN
  Administered 2016-06-06: 6000 [IU] via INTRAVENOUS
  Administered 2016-06-06: 2000 [IU] via INTRAVENOUS
  Administered 2016-06-06: 1000 [IU] via INTRAVENOUS

## 2016-06-06 MED ORDER — VERAPAMIL HCL 2.5 MG/ML IV SOLN
INTRAVENOUS | Status: AC
  Filled 2016-06-06: qty 2

## 2016-06-06 MED ORDER — NITROGLYCERIN 1 MG/10 ML FOR IR/CATH LAB
INTRA_ARTERIAL | Status: DC | PRN
  Administered 2016-06-06 (×2): 150 ug via INTRACORONARY

## 2016-06-06 MED ORDER — LABETALOL HCL 5 MG/ML IV SOLN
10.0000 mg | INTRAVENOUS | Status: AC | PRN
Start: 2016-06-06 — End: 2016-06-06

## 2016-06-06 MED ORDER — SODIUM CHLORIDE 0.9 % WEIGHT BASED INFUSION
3.0000 mL/kg/h | INTRAVENOUS | Status: DC
  Administered 2016-06-06: 3 mL/kg/h via INTRAVENOUS

## 2016-06-06 MED ORDER — HYDRALAZINE HCL 20 MG/ML IJ SOLN: 5.0000 mg | INTRAMUSCULAR | Status: AC | PRN

## 2016-06-06 MED ORDER — ACETAMINOPHEN 500 MG PO TABS: 1000.0000 mg | ORAL_TABLET | ORAL | Status: DC | PRN

## 2016-06-06 MED ORDER — VERAPAMIL HCL 2.5 MG/ML IV SOLN
INTRAVENOUS | Status: DC | PRN
  Administered 2016-06-06: 10 mL via INTRA_ARTERIAL

## 2016-06-06 MED ORDER — FENTANYL CITRATE (PF) 100 MCG/2ML IJ SOLN
INTRAMUSCULAR | Status: DC | PRN
  Administered 2016-06-06: 25 ug via INTRAVENOUS

## 2016-06-06 MED ORDER — FLUTICASONE PROPIONATE 50 MCG/ACT NA SUSP: 2.0000 | Freq: Every day | NASAL | Status: DC | PRN

## 2016-06-06 MED ORDER — GLUCOSAMINE CHONDR 1500 COMPLX PO CAPS: ORAL_CAPSULE | Freq: Every day | ORAL | Status: DC

## 2016-06-06 MED ORDER — CLOPIDOGREL BISULFATE 75 MG PO TABS: 75.0000 mg | ORAL_TABLET | Freq: Every day | ORAL | Status: DC

## 2016-06-06 MED ORDER — HEPARIN (PORCINE) IN NACL 2-0.9 UNIT/ML-% IJ SOLN
INTRAMUSCULAR | Status: AC | PRN
  Administered 2016-06-06: 1000 mL

## 2016-06-06 MED ORDER — MIDAZOLAM HCL 2 MG/2ML IJ SOLN
INTRAMUSCULAR | Status: DC | PRN
  Administered 2016-06-06: 1 mg via INTRAVENOUS

## 2016-06-06 MED ORDER — LIDOCAINE HCL (PF) 1 % IJ SOLN
INTRAMUSCULAR | Status: DC | PRN
  Administered 2016-06-06: 2 mL

## 2016-06-06 MED ORDER — SODIUM CHLORIDE 0.9 % IV SOLN: INTRAVENOUS | Status: AC

## 2016-06-06 MED ORDER — SODIUM CHLORIDE 0.9 % IV SOLN
250.0000 mL | INTRAVENOUS | Status: DC | PRN
Start: 2016-06-06 — End: 2016-06-06

## 2016-06-06 MED ORDER — LATANOPROST 0.005 % OP SOLN: 1.0000 [drp] | Freq: Every day | OPHTHALMIC | Status: DC

## 2016-06-06 MED ORDER — PRAVASTATIN SODIUM 40 MG PO TABS: 40.0000 mg | ORAL_TABLET | Freq: Every evening | ORAL | Status: DC

## 2016-06-06 MED ORDER — LIDOCAINE HCL 1 % IJ SOLN
INTRAMUSCULAR | Status: AC
  Filled 2016-06-06: qty 20

## 2016-06-06 MED ORDER — HEPARIN SODIUM (PORCINE) 1000 UNIT/ML IJ SOLN
INTRAMUSCULAR | Status: AC
  Filled 2016-06-06: qty 1

## 2016-06-06 MED ORDER — ACETAMINOPHEN 500 MG PO TABS: 500.0000 mg | ORAL_TABLET | Freq: Four times a day (QID) | ORAL | 0 refills | Status: DC | PRN

## 2016-06-06 MED ORDER — FENTANYL CITRATE (PF) 100 MCG/2ML IJ SOLN
INTRAMUSCULAR | Status: AC
  Filled 2016-06-06: qty 2

## 2016-06-06 MED ORDER — ONDANSETRON HCL 4 MG/2ML IJ SOLN
4.0000 mg | Freq: Four times a day (QID) | INTRAMUSCULAR | Status: DC | PRN
Start: 2016-06-06 — End: 2016-06-06

## 2016-06-06 MED ORDER — HEPARIN (PORCINE) IN NACL 2-0.9 UNIT/ML-% IJ SOLN
INTRAMUSCULAR | Status: AC
  Filled 2016-06-06: qty 1000

## 2016-06-06 MED ORDER — LOSARTAN POTASSIUM 50 MG PO TABS: 100.0000 mg | ORAL_TABLET | Freq: Every day | ORAL | Status: DC

## 2016-06-06 SURGICAL SUPPLY — 20 items
BALLN EUPHORA RX 2.0X12 (BALLOONS) ×2
BALLN MOZEC 2.0X12 (BALLOONS) ×2
BALLN ~~LOC~~ EUPHORA RX 2.5X12 (BALLOONS) ×2
BALLOON EUPHORA RX 2.0X12 (BALLOONS) ×1 IMPLANT
BALLOON MOZEC 2.0X12 (BALLOONS) ×1 IMPLANT
BALLOON ~~LOC~~ EUPHORA RX 2.5X12 (BALLOONS) ×1 IMPLANT
CATH LAUNCHER 5F AL1 (CATHETERS) ×1 IMPLANT
CATH LAUNCHER 5F JR4 (CATHETERS) ×2 IMPLANT
CATHETER LAUNCHER 5F AL1 (CATHETERS) ×2
DEVICE RAD COMP TR BAND LRG (VASCULAR PRODUCTS) ×2 IMPLANT
GLIDESHEATH SLEND SS 6F .021 (SHEATH) ×2 IMPLANT
GUIDEWIRE INQWIRE 1.5J.035X260 (WIRE) ×1 IMPLANT
INQWIRE 1.5J .035X260CM (WIRE) ×2
KIT ENCORE 26 ADVANTAGE (KITS) ×4 IMPLANT
KIT HEART LEFT (KITS) ×2 IMPLANT
PACK CARDIAC CATHETERIZATION (CUSTOM PROCEDURE TRAY) ×2 IMPLANT
STENT SYNERGY DES 2.25X16 (Permanent Stent) ×2 IMPLANT
TRANSDUCER W/STOPCOCK (MISCELLANEOUS) ×2 IMPLANT
TUBING CIL FLEX 10 FLL-RA (TUBING) ×2 IMPLANT
WIRE COUGAR XT STRL 190CM (WIRE) ×2 IMPLANT

## 2016-06-06 NOTE — H&P (View-Only) (Signed)
Cardiology Office Note Date:  05/13/2016   ID:  [name] [name], DOB 1935-07-29, MRN [id number]  PCP:  [name], MD  Cardiologist:  [name], MD    Chief Complaint  Patient presents with  . TAVR consult     History of Present Illness: [name] [name] is a 81 y.o. male who presents for evaluation of severe aortic stenosis, referred by Dr Radford Pax.  Reports he was first noted to have a heart murmur approximately 4 years ago and he has been followed by Dr Radford Pax since that time. He has a hx of both MVP with mild MR and aortic stenosis with bicuspid aortic valve.   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 He has been active in the past but now is quite limited by pain. He is followed by Dr Wynelle Link who has recommended a total hip replacement. The patient has difficulty ambulating and is using a cane.   He is here alone today. Feels very fatigued, especially in the afternoons. He was previously active and able to hike in the woods as recently as 2 years ago without any symptoms. Now is unable to swim or do much walking at all because of his hip problem. He specifically denies chest pain, chest pressure, or shortness of breath. Reports some lightheadedness, no syncope. Feels unsteady on his feet.   The patient works as a Animal nutritionist at [employer]. He is a widower since his wife passed away in 04/19/2007.   Past Medical History:  Diagnosis Date  . Actinic keratoses    Dr Glee Arvin  . Allergic rhinitis   . Aortic valve stenosis, moderate   . Benign essential HTN   . Bicuspid aortic valve   . Bilateral cataracts   . BPH (benign prostatic hypertrophy)   . Dilated aortic root ([place])    61mm by echo 08/2015  . Glaucoma   . H/O seasonal allergies   . Hx of adenomatous colonic polyps 1991 on  . Hx of cardiovascular stress test    Myoview (10/15):  normal  . Hyperlipidemia   . Mitral valve prolapse    mild MR by echo  08/2015  . Paresthesia    Chronic left lateral thigh    Past Surgical History:  Procedure Laterality Date  . COLONOSCOPY  multiple  . TONSILLECTOMY  1949    Current Outpatient Prescriptions  Medication Sig Dispense Refill  . Ascorbic Acid (VITAMIN C) 1000 MG tablet Take 1,000 mg by mouth daily.    Marland Kitchen aspirin 81 MG tablet Take 81 mg by mouth daily.    . fish oil-omega-3 fatty acids 1000 MG capsule Take 1 g by mouth daily.     Marland Kitchen glucosamine-chondroitin 500-400 MG tablet Take 1 tablet by mouth 3 (three) times daily.    Marland Kitchen latanoprost (XALATAN) 0.005 % ophthalmic solution Place 1 drop into both eyes at bedtime.    Marland Kitchen losartan (COZAAR) 100 MG tablet Take 100 mg by mouth daily.    . pravastatin (PRAVACHOL) 40 MG tablet Take 40 mg by mouth daily.     . timolol (BETIMOL) 0.25 % ophthalmic solution Place 1-2 drops into both eyes daily.      No current facility-administered medications for this visit.     Allergies:   Patient has no known allergies.   Social History:  The patient  reports that he quit smoking about 55 years ago. He has never used smokeless tobacco. He reports  that he drinks about 3.0 oz of alcohol per week . He reports that he does not use drugs.   Family History:  The patient's family history includes Emphysema in his mother; Heart attack in his brother; Heart disease in his brother; Pancreatic cancer in his father.   ROS:  Please see the history of present illness.  Otherwise, review of systems is positive for leg pain.  All other systems are reviewed and negative.   PHYSICAL EXAM: VS:  BP (!) 146/70   Pulse 60   Ht 5' 9.5" (1.765 m)   Wt 135 lb 9.6 oz (61.5 kg)   BMI 19.74 kg/m  , BMI Body mass index is 19.74 kg/m. GEN: Thin, elderly male, in no acute distress  HEENT: normal  Neck: no JVD, no masses. bilateral carotid bruits Cardiac: RRR with 3/6 harsh mid-peaking systolic murmur at the RUSB, A2 is audible              Respiratory:  clear to auscultation  bilaterally, normal work of breathing GI: soft, nontender, nondistended, + BS MS: no deformity or atrophy  Ext: no pretibial edema, pedal pulses 2+= bilaterally Skin: warm and dry, no rash Neuro:  Strength and sensation are intact Psych: euthymic mood, full affect  EKG:  EKG is ordered today. The ekg ordered today shows NSR 61 bpm, LAE, LAFB  Recent Labs: 09/21/2015: BUN 18; Creat 0.74; Potassium 4.8; Sodium 130   Lipid Panel  No results found for: CHOL, TRIG, HDL, CHOLHDL, VLDL, LDLCALC, LDLDIRECT    Wt Readings from Last 3 Encounters:  05/13/16 135 lb 9.6 oz (61.5 kg)  09/21/15 147 lb (66.7 kg)  08/14/15 149 lb (67.6 kg)     Cardiac Studies Reviewed: CTA Chest 09/28/2015: IMPRESSION: 1. No thoracic aortic dissection.  Normal caliber thoracic aorta. 2. 6 mm hypodense mass in the mid pancreatic body. Further evaluation with MRI of the abdomen is recommended for better characterization.  MR Abdomen: IMPRESSION: 1. No acute findings identified within the upper abdomen. 2. Benign appearing fluid intensity structure within the body of pancreas is identified without associated enhancement. This is favored to represent a benign etiology such as a small pseudocyst or benign cystic neoplasm of the pancreas. Followup imaging at 12 months advised to ensure stability. 3. Small kidney and liver cysts.  Echo 05/07/2016: Left ventricle:  The cavity size was normal. Wall thickness was increased in a pattern of mild LVH. Systolic function was normal. The estimated ejection fraction was in the range of 60% to 65%. Wall motion was normal; there were no regional wall motion abnormalities. Doppler parameters are consistent with abnormal left ventricular relaxation (grade 1 diastolic dysfunction).  ------------------------------------------------------------------- Aortic valve:   Mildly calcified annulus. Severely thickened, moderately calcified leaflets.  Doppler:   There was  severe stenosis.   There was mild regurgitation.    VTI ratio of LVOT to aortic valve: 0.24. Valve area (VTI): 0.93 cm^2. Indexed valve area (VTI): 0.52 cm^2/m^2. Peak velocity ratio of LVOT to aortic valve: 0.25. Valve area (Vmax): 0.93 cm^2. Indexed valve area (Vmax): 0.52 cm^2/m^2. Mean velocity ratio of LVOT to aortic valve: 0.24. Valve area (Vmean): 0.9 cm^2. Indexed valve area (Vmean): 0.5 cm^2/m^2.  Mean gradient (S): 45 mm Hg. Peak gradient (S): 76 mm Hg.  ------------------------------------------------------------------- Aorta:  Aortic root: The aortic root was normal in size. Ascending aorta: The ascending aorta was normal in size.  ------------------------------------------------------------------- Mitral valve:   Mildly calcified annulus.  Doppler:  There was trivial regurgitation.  Peak gradient (D): 3 mm Hg.  ------------------------------------------------------------------- Left atrium:  The atrium was severely dilated.  ------------------------------------------------------------------- Right ventricle:  The cavity size was normal. Systolic function was normal.  ------------------------------------------------------------------- Pulmonic valve:    The valve appears to be grossly normal. Doppler:  There was mild regurgitation.  ------------------------------------------------------------------- Tricuspid valve:   Structurally normal valve.   Leaflet separation was normal.  Doppler:  Transvalvular velocity was within the normal range. There was mild regurgitation.  ------------------------------------------------------------------- Pulmonary artery:   Systolic pressure was moderately increased.  ------------------------------------------------------------------- Right atrium:  The atrium was mildly dilated.  ------------------------------------------------------------------- Pericardium:  A small, free-flowing pericardial effusion was identified  circumferential to the heart.  ------------------------------------------------------------------- Systemic veins: Inferior vena cava: The vessel was normal in size. The respirophasic diameter changes were in the normal range (>= 50%), consistent with normal central venous pressure.  ------------------------------------------------------------------- Measurements   Left ventricle                            Value          Reference  LV ID, ED, PLAX chordal           (L)     42.8  mm       43 - 52  LV ID, ES, PLAX chordal                   24.6  mm       23 - 38  LV fx shortening, PLAX chordal            43    %        >=29  LV PW thickness, ED                       12    mm       ---------  IVS/LV PW ratio, ED                       1.08           <=1.3  Stroke volume, 2D                         100   ml       ---------  Stroke volume/bsa, 2D                     56    ml/m^2   ---------  LV e&', lateral                            7.02  cm/s     ---------  LV E/e&', lateral                          13.15          ---------  LV e&', medial                             6.36  cm/s     ---------  LV E/e&', medial                           14.51          ---------  LV e&', average                            6.69  cm/s     ---------  LV E/e&', average                          13.8           ---------    Ventricular septum                        Value          Reference  IVS thickness, ED                         13    mm       ---------    LVOT                                      Value          Reference  LVOT ID, S                                22    mm       ---------  LVOT area                                 3.8   cm^2     ---------  LVOT ID                                   22    mm       ---------  LVOT peak velocity, S                     107   cm/s     ---------  LVOT mean velocity, S                     75.9  cm/s     ---------  LVOT VTI, S                               26.2  cm        ---------  LVOT peak gradient, S                     5     mm Hg    ---------  Stroke volume (SV), LVOT DP               99.6  ml       ---------  Stroke index (SV/bsa), LVOT DP            55.3  ml/m^2   ---------    Aortic valve                              Value          Reference  Aortic valve peak  velocity, S             436   cm/s     ---------  Aortic valve mean velocity, S             319   cm/s     ---------  Aortic valve VTI, S                       107   cm       ---------  Aortic mean gradient, S                   45    mm Hg    ---------  Aortic peak gradient, S                   76    mm Hg    ---------  VTI ratio, LVOT/AV                        0.24           ---------  Aortic valve area, VTI                    0.93  cm^2     ---------  Aortic valve area/bsa, VTI                0.52  cm^2/m^2 ---------  Velocity ratio, peak, LVOT/AV             0.25           ---------  Aortic valve area, peak velocity          0.93  cm^2     ---------  Aortic valve area/bsa, peak               0.52  cm^2/m^2 ---------  velocity  Velocity ratio, mean, LVOT/AV             0.24           ---------  Aortic valve area, mean velocity          0.9   cm^2     ---------  Aortic valve area/bsa, mean               0.5   cm^2/m^2 ---------  velocity  Aortic regurg pressure half-time          555   ms       ---------    Aorta                                     Value          Reference  Aortic root ID, ED                        39    mm       ---------  Ascending aorta ID, A-P, S                33    mm       ---------    Left atrium                               Value          Reference  LA ID, A-P, ES                            41    mm       ---------  LA ID/bsa, A-P                    (H)     2.28  cm/m^2   <=2.2  LA volume, S                              95    ml       ---------  LA volume/bsa, S                          52.8  ml/m^2   ---------  LA volume, ES, 1-p A4C                     97    ml       ---------  LA volume/bsa, ES, 1-p A4C                53.9  ml/m^2   ---------  LA volume, ES, 1-p A2C                    83    ml       ---------  LA volume/bsa, ES, 1-p A2C                46.1  ml/m^2   ---------    Mitral valve                              Value          Reference  Mitral E-wave peak velocity               92.3  cm/s     ---------  Mitral A-wave peak velocity               111   cm/s     ---------  Mitral deceleration time          (H)     289   ms       150 - 230  Mitral peak gradient, D                   3     mm Hg    ---------  Mitral E/A ratio, peak                    0.8            ---------    Pulmonary arteries                        Value          Reference  PA pressure, S, DP                (H)     41    mm Hg    <=30    Tricuspid valve                           Value  Reference  Tricuspid regurg peak velocity            307   cm/s     ---------  Tricuspid peak RV-RA gradient             38    mm Hg    ---------    Right ventricle                           Value          Reference  RV s&', lateral, S                         16.8  cm/s     ---------  Legend: (L)  and  (H)  mark values outside specified reference range.  STS Risk Calculator: Procedure: AV Replacement  Risk of Mortality: 1.873%  Morbidity or Mortality: 13.624%  Long Length of Stay: 4.526%  Short Length of Stay: 45.359%  Permanent Stroke: 1.286%  Prolonged Ventilation: 7.359%  DSW Infection: 0.225%  Renal Failure: 2.917%  Reoperation: 7.145%   ASSESSMENT AND PLAN: 81 yo male with severe bicuspid aortic valve stenosis: difficult to know whether there is any cardiovascular functional limitation because he has become so limited from end-stage osteoarthritis of the left hip. I have reviewed the natural history of aortic stenosis with the patient today. We have discussed the limitations of medical therapy and the poor prognosis associated with symptomatic aortic  stenosis. We have reviewed potential treatment options, including observation with medical therapy, conventional surgical aortic valve replacement, and transcatheter aortic valve replacement. We discussed treatment options in the context of this patient's specific comorbid medical conditions. He has had significant progression of aortic stenosis by echo criteria. I have personally reviewed his echo images and he has clear evidence of severe AS with a peak transvalvular velocity in excess of 4 m/s and mean gradient 47 mmHg. There is mild aortic insufficiency present. While he appears to have a bicuspid valve morphology, he will not need aortic root surgery as his ascending aorta has a maximal diameter of 3.5 cm by CTA last year. Considering his need for hip surgery to allow for him to return to a normal functional status, it appears best to treat his progressive and severe aortic stenosis prior to noncardiac surgery. I have recommended R/L heart catheterization, CT angiography of the heart prior to formal cardiac surgical evaluation. If he ultimately requires conventional surgery, he would be interested in a minimally invasive approach. Will refer him for evaluation by Dr Roxy Manns after his cath and CTA studies are completed.   I have reviewed the risks, indications, and alternatives to cardiac catheterization, possible angioplasty, and stenting with the patient. Risks include but are not limited to bleeding, infection, vascular injury, stroke, myocardial infection, arrhythmia, kidney injury, radiation-related injury in the case of prolonged fluoroscopy use, emergency cardiac surgery, and death. The patient understands the risks of serious complication is 1-2 in 9983 with diagnostic cardiac cath and 1-2% or less with angioplasty/stenting.   Current medicines are reviewed with the patient today.  The patient does not have concerns regarding medicines.  Labs/ tests ordered today include:   Orders Placed This  Encounter  Procedures  . CT ANGIO CHEST AORTA W &/OR WO CONTRAST  . CT Angio Abd/Pel w/ and/or w/o  . CT CORONARY MORPH W/CTA COR W/SCORE W/CA W/CM &/OR WO/CM  . Basic metabolic panel  . Ambulatory  referral to Cardiothoracic Surgery  . EKG 12-Lead    Disposition:   FU pending test results  Signed, [name], MD  05/13/2016 2:37 PM    [address], [place], [place]  [address] Phone: [phone]; Fax: [phone]

## 2016-06-06 NOTE — Interval H&P Note (Signed)
Cath Lab Visit (complete for each Cath Lab visit)  Clinical Evaluation Leading to the Procedure:   ACS: No.  Non-ACS:    Anginal Classification: CCS II  Anti-ischemic medical therapy: No Therapy  Non-Invasive Test Results: No non-invasive testing performed  Prior CABG: No previous CABG      History and Physical Interval Note:  06/06/2016 7:36 AM  [name]  has presented today for surgery, with the diagnosis of cad  The various methods of treatment have been discussed with the patient and family. After consideration of risks, benefits and other options for treatment, the patient has consented to  Procedure(s): Coronary Stent Intervention (N/A) as a surgical intervention .  The patient's history has been reviewed, patient examined, no change in status, stable for surgery.  I have reviewed the patient's chart and labs.  Questions were answered to the patient's satisfaction.     [name]

## 2016-06-06 NOTE — Discharge Instructions (Signed)
Radial Site Care °Refer to this sheet in the next few weeks. These instructions provide you with information about caring for yourself after your procedure. Your health care provider may also give you more specific instructions. Your treatment has been planned according to current medical practices, but problems sometimes occur. Call your health care provider if you have any problems or questions after your procedure. °What can I expect after the procedure? °After your procedure, it is typical to have the following: °· Bruising at the radial site that usually fades within 1-2 weeks. °· Blood collecting in the tissue (hematoma) that may be painful to the touch. It should usually decrease in size and tenderness within 1-2 weeks. °Follow these instructions at home: °· Take medicines only as directed by your health care provider. °· You may shower 24-48 hours after the procedure or as directed by your health care provider. Remove the bandage (dressing) and gently wash the site with plain soap and water. Pat the area dry with a clean towel. Do not rub the site, because this may cause bleeding. °· Do not take baths, swim, or use a hot tub until your health care provider approves. °· Check your insertion site every day for redness, swelling, or drainage. °· Do not apply powder or lotion to the site. °· Do not flex or bend the affected arm for 24 hours or as directed by your health care provider. °· Do not push or pull heavy objects with the affected arm for 24 hours or as directed by your health care provider. °· Do not lift over 10 lb (4.5 kg) for 5 days after your procedure or as directed by your health care provider. °· Ask your health care provider when it is okay to: °¨ Return to work or school. °¨ Resume usual physical activities or sports. °¨ Resume sexual activity. °· Do not drive home if you are discharged the same day as the procedure. Have someone else drive you. °· You may drive 24 hours after the procedure  unless otherwise instructed by your health care provider. °· Do not operate machinery or power tools for 24 hours after the procedure. °· If your procedure was done as an outpatient procedure, which means that you went home the same day as your procedure, a responsible adult should be with you for the first 24 hours after you arrive home. °· Keep all follow-up visits as directed by your health care provider. This is important. °Contact a health care provider if: °· You have a fever. °· You have chills. °· You have increased bleeding from the radial site. Hold pressure on the site. °Get help right away if: °· You have unusual pain at the radial site. °· You have redness, warmth, or swelling at the radial site. °· You have drainage (other than a small amount of blood on the dressing) from the radial site. °· The radial site is bleeding, and the bleeding does not stop after 30 minutes of holding steady pressure on the site. °· Your arm or hand becomes pale, cool, tingly, or numb. °This information is not intended to replace advice given to you by your health care provider. Make sure you discuss any questions you have with your health care provider. °Document Released: 02/02/2010 Document Revised: 06/08/2015 Document Reviewed: 07/19/2013 °Elsevier Interactive Patient Education © 2017 [employer] ° °

## 2016-06-06 NOTE — Interval H&P Note (Signed)
History and Physical Interval Note:  06/06/2016 7:38 AM  [name] [name]  has presented today for surgery, with the diagnosis of cad  The various methods of treatment have been discussed with the patient and family. After consideration of risks, benefits and other options for treatment, the patient has consented to  Procedure(s): Coronary Stent Intervention (N/A) as a surgical intervention .  The patient's history has been reviewed, patient examined, no change in status, stable for surgery.  I have reviewed the patient's chart and labs.  Questions were answered to the patient's satisfaction.    The patient has had pre-TAVR cath demonstrating severe RCA stenosis with positive FFR <0.7. Case reviewed and discussed with Dr Roxy Manns. With significant ischemia in single vessel distribution, we agree PCI is indicated prior to TAVR.   [name]

## 2016-06-06 NOTE — Progress Notes (Signed)
[id number] Discussed NTG use, heart healthy diet food choices and importance of plavix with stent. Did not give ex ed as pt is limited by his hip and needs surgery. Will not refer to CRP 2 at this time as pt stated he will be having TAVR follow up soon. Will determine at that time best time for referral as pt will still need hip surgery.  Graylon Good RN BSN 06/06/2016 11:54 AM

## 2016-06-06 NOTE — Progress Notes (Signed)
Patient had a 4 beat run of vtach notified by central telemetry.  Dr. Burt Knack made aware.

## 2016-06-06 NOTE — Discharge Summary (Signed)
Discharge Summary    Patient ID: [name] [name],  MRN: [id number], DOB/AGE: October 28, 1935 81 y.o.  Admit date: 06/06/2016 Discharge date: 06/06/2016  Primary Care Provider: Maury Dus Primary Cardiologist: Burt Knack  Discharge Diagnoses    Active Problems:   Abnormal fractional flow reserve (FFR) on cardiac catheterization   Allergies No Known Allergies  Diagnostic Studies/Procedures    LHC: 06/06/16  Conclusion   Successful single vessel PCI using a 2.25 x 16 mid or Synergy DES  Recommendation: Patient will continue on dual antiplatelet therapy with aspirin and clopidogrel. Anticipate proceeding with TAVR in the near future. Pt will be discharged later today as long as no complications arise   _____________   History of Present Illness     [name] [name] is a 81 y.o. male who was refered for evaluation of severe aortic stenosis, referred by Dr Radford Pax to Dr. Burt Knack.   Reported he was first noted to have a heart murmur approximately 4 years ago and he has been followed by Dr Radford Pax since that time. He has a hx of both MVP with mild MR and aortic stenosis with bicuspid aortic valve.   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 He has been active in the past but now is quite limited by pain. He is followed by Dr Wynelle Link who has recommended a total hip replacement. The patient has difficulty ambulating and is using a cane.   He was seen in the office on 05/13/16 by Dr. Burt Knack and reported feeling very fatigued, especially in the afternoons. He was previously active and able to hike in the woods as recently as 2 years ago without any symptoms. Now stated being unable to swim or do much walking at all because of his hip problem. He specifically denies chest pain, chest pressure, or shortness of breath. Reports some lightheadedness, no syncope. Felt unsteady on his feet.   The patient currently works as a Animal nutritionist at  [employer]. He is a widower since his wife passed away in 04-25-2007. The option of TAVR was discussed with the patient, and he was referred for [facility] as part of standard work up. Noted to have an 80% lesion in the RCA and planned to return for stent placement.   Hospital Course     Underwent LHC with Dr. Burt Knack noted above with DES x1 placed to the [facility]. Plan to continue with DAPT with ASA/plavix. He felt well post cath. He was seen by cardiac rehab while in short stay. Radial cath site stable prior to discharge, mild bruising noted. Educated on post cath restrictions. All questions answered. Follow up in the office has been arranged. Medications are listed below.  _____________  Discharge Vitals Blood pressure (!) 130/56, pulse (!) 56, temperature 98 F (36.7 C), temperature source Oral, resp. rate 20, height 5\' 9"  (1.753 m), weight 140 lb (63.5 kg), SpO2 98 %.  Filed Weights   06/06/16 0551  Weight: 140 lb (63.5 kg)    Labs & Radiologic Studies    CBC  Recent Labs  06/04/16 0830  WBC 6.7  HCT 41.6  MCV 93  PLT 063   Basic Metabolic Panel  Recent Labs  06/04/16 0830  NA 136  K 5.2  CL 95*  CO2 27  GLUCOSE 95  BUN 13  CREATININE 0.69*  CALCIUM 9.6   Liver Function Tests No results for input(s): AST, ALT, ALKPHOS, BILITOT, PROT, ALBUMIN in the last  72 hours. No results for input(s): LIPASE, AMYLASE in the last 72 hours. Cardiac Enzymes No results for input(s): CKTOTAL, CKMB, CKMBINDEX, TROPONINI in the last 72 hours. BNP Invalid input(s): POCBNP D-Dimer No results for input(s): DDIMER in the last 72 hours. Hemoglobin A1C No results for input(s): HGBA1C in the last 72 hours. Fasting Lipid Panel No results for input(s): CHOL, HDL, LDLCALC, TRIG, CHOLHDL, LDLDIRECT in the last 72 hours. Thyroid Function Tests No results for input(s): TSH, T4TOTAL, T3FREE, THYROIDAB in the last 72 hours.  Invalid input(s): FREET3 _____________    Disposition   Pt is being discharged  home today in good condition.  Follow-up Plans & Appointments    Follow-up Information    Liliane Shi, PA-C Follow up on 06/14/2016.   Specialties:  Cardiology, Physician Assistant Why:  at 2:30pm for your follow up appt.  Contact information: [address] [place] [address] [phone]          Discharge Instructions    Call MD for:  redness, tenderness, or signs of infection (pain, swelling, redness, odor or green/yellow discharge around incision site)    Complete by:  As directed    Diet - low sodium heart healthy    Complete by:  As directed    Discharge instructions    Complete by:  As directed    Radial Site Care Refer to this sheet in the next few weeks. These instructions provide you with information on caring for yourself after your procedure. Your caregiver may also give you more specific instructions. Your treatment has been planned according to current medical practices, but problems sometimes occur. Call your caregiver if you have any problems or questions after your procedure. HOME CARE INSTRUCTIONS You may shower the day after the procedure.Remove the bandage (dressing) and gently wash the site with plain soap and water.Gently pat the site dry.  Do not apply powder or lotion to the site.  Do not submerge the affected site in water for 3 to 5 days.  Inspect the site at least twice daily.  Do not flex or bend the affected arm for 24 hours.  No lifting over 5 pounds (2.3 kg) for 5 days after your procedure.  Do not drive home if you are discharged the same day of the procedure. Have someone else drive you.  You may drive 24 hours after the procedure unless otherwise instructed by your caregiver.  What to expect: Any bruising will usually fade within 1 to 2 weeks.  Blood that collects in the tissue (hematoma) may be painful to the touch. It should usually decrease in size and tenderness within 1 to 2 weeks.  SEEK IMMEDIATE MEDICAL CARE  IF: You have unusual pain at the radial site.  You have redness, warmth, swelling, or pain at the radial site.  You have drainage (other than a small amount of blood on the dressing).  You have chills.  You have a fever or persistent symptoms for more than 72 hours.  You have a fever and your symptoms suddenly get worse.  Your arm becomes pale, cool, tingly, or numb.  You have heavy bleeding from the site. Hold pressure on the site.    Please keep all scheduled appts regarding your valve replacement work up. Continue on ASA and plavix.   Increase activity slowly    Complete by:  As directed       Discharge Medications   Current Discharge Medication List    CONTINUE these  medications which have CHANGED   Details  acetaminophen (TYLENOL) 500 MG tablet Take 1 tablet (500 mg total) by mouth every 6 (six) hours as needed for moderate pain. Qty: 30 tablet, Refills: 0      CONTINUE these medications which have NOT CHANGED   Details  Ascorbic Acid (VITAMIN C) 1000 MG tablet Take 1,000 mg by mouth daily.    aspirin 81 MG tablet Take 81 mg by mouth every evening.     clopidogrel (PLAVIX) 75 MG tablet Take 1 tablet (75 mg total) by mouth daily. Qty: 30 tablet, Refills: 11   Associated Diagnoses: Aortic valve stenosis, etiology of cardiac valve disease unspecified; Coronary artery disease involving native coronary artery of native heart without angina pectoris    fluticasone (FLONASE) 50 MCG/ACT nasal spray Place 2 sprays into both nostrils daily as needed for allergies or rhinitis.    Glucosamine-Chondroit-Vit C-Mn (GLUCOSAMINE CHONDR 1500 COMPLX PO) Take 1 tablet by mouth daily.    latanoprost (XALATAN) 0.005 % ophthalmic solution Place 1 drop into both eyes at bedtime.    losartan (COZAAR) 100 MG tablet Take 100 mg by mouth daily.    Omega-3 Fatty Acids (FISH OIL) 500 MG CAPS Take 500 mg by mouth daily.    pravastatin (PRAVACHOL) 40 MG tablet Take 40 mg by mouth every evening.      timolol (BETIMOL) 0.5 % ophthalmic solution Place 1 drop into both eyes 2 (two) times daily.          Outstanding Labs/Studies   N/A  Duration of Discharge Encounter   Greater than 30 minutes including physician time.  Signed, Reino Bellis NP-C 06/06/2016, 2:30 PM

## 2016-06-12 ENCOUNTER — Encounter: Payer: Self-pay | Admitting: Surgery

## 2016-06-12 ENCOUNTER — Ambulatory Visit
Payer: BC Managed Care – PPO | Attending: Thoracic Surgery (Cardiothoracic Vascular Surgery) | Admitting: Physical Therapy

## 2016-06-12 ENCOUNTER — Institutional Professional Consult (permissible substitution) (INDEPENDENT_AMBULATORY_CARE_PROVIDER_SITE_OTHER): Payer: BC Managed Care – PPO | Admitting: Surgery

## 2016-06-12 ENCOUNTER — Inpatient Hospital Stay: Admit: 2016-06-12 | Payer: BC Managed Care – PPO | Admitting: Orthopedic Surgery

## 2016-06-12 ENCOUNTER — Encounter: Payer: Self-pay | Admitting: Physical Therapy

## 2016-06-12 VITALS — BP 106/62 | HR 70 | Resp 16 | Ht 69.0 in | Wt 140.0 lb

## 2016-06-12 DIAGNOSIS — M6281 Muscle weakness (generalized): Secondary | ICD-10-CM | POA: Diagnosis present

## 2016-06-12 DIAGNOSIS — R2689 Other abnormalities of gait and mobility: Secondary | ICD-10-CM

## 2016-06-12 DIAGNOSIS — I350 Nonrheumatic aortic (valve) stenosis: Secondary | ICD-10-CM

## 2016-06-12 DIAGNOSIS — Q231 Congenital insufficiency of aortic valve: Secondary | ICD-10-CM | POA: Diagnosis not present

## 2016-06-12 DIAGNOSIS — R293 Abnormal posture: Secondary | ICD-10-CM

## 2016-06-12 SURGERY — ARTHROPLASTY, HIP, TOTAL, ANTERIOR APPROACH
Anesthesia: Choice | Site: Hip | Laterality: Left

## 2016-06-12 NOTE — Therapy (Signed)
[place], [place], [address] Phone: [phone]   Fax:  [phone]  Physical Therapy Evaluation  Patient Details  Name: [name] [name] MRN: [id number] Date of Birth: 1935-03-13 Referring Provider: Dr. Darylene Price  Encounter Date: 06/12/2016      PT End of Session - 06/12/16 1301    Visit Number 1   PT Start Time 1300   PT Stop Time 1336   PT Time Calculation (min) 36 min      Past Medical History:  Diagnosis Date  . Actinic keratoses    Dr Glee Arvin  . Allergic rhinitis   . Aortic stenosis   . Benign essential HTN   . Bicuspid aortic valve   . Bilateral cataracts   . BPH (benign prostatic hypertrophy)   . Dilated aortic root ([place])    62mm by echo 08/2015  . Glaucoma   . H/O seasonal allergies   . Hx of adenomatous colonic polyps 1991 on  . Hx of cardiovascular stress test    Myoview (10/15):  normal  . Hyperlipidemia   . Mitral valve prolapse    mild MR by echo 08/2015  . Paresthesia    Chronic left lateral thigh    Past Surgical History:  Procedure Laterality Date  . COLONOSCOPY  multiple  . CORONARY STENT INTERVENTION N/A 06/06/2016   Procedure: Coronary Stent Intervention;  Surgeon: [name], MD;  Location: [facility];  Service: Cardiovascular;  Laterality: N/A;  . INTRAVASCULAR PRESSURE WIRE/FFR STUDY N/A 05/29/2016   Procedure: Intravascular Pressure Wire/FFR Study;  Surgeon: [name], MD;  Location: [facility];  Service: Cardiovascular;  Laterality: N/A;  . RIGHT/LEFT HEART CATH AND CORONARY ANGIOGRAPHY N/A 05/29/2016   Procedure: Right/Left Heart Cath and Coronary Angiography;  Surgeon: [name], MD;  Location: [facility];  Service: Cardiovascular;  Laterality: N/A;  . TONSILLECTOMY  1949    There were no vitals filed for this visit.       Subjective Assessment - 06/12/16 1304    Subjective Pt reports progressive fatigue,  shortness of breath with activity and dizziness over the past year or so.    Patient Stated Goals fix his heart so he can fix his hip   Currently in Pain? No/denies  not when sitting            [facility] PT Assessment - 06/12/16 0001      Assessment   Medical Diagnosis severe aortic stenosis   Referring Provider Dr. Darylene Price   Onset Date/Surgical Date --  last year sometime     Precautions   Precautions Fall     Restrictions   Weight Bearing Restrictions No     Balance Screen   Has the patient fallen in the past 6 months Yes   How many times? 1  thinks due to dizziness   Has the patient had a decrease in activity level because of a fear of falling?  No   Is the patient reluctant to leave their home because of a fear of falling?  No     Home Environment   Living Environment Private residence   Living Arrangements Alone   Type of [place] to enter   Entrance Stairs-Number of Steps 1   Entrance Stairs-Rails None  holds to doorknob if needed   [place] One level;Able to live on main level with bedroom/bathroom     Posture/Postural Control  Posture/Postural Control Postural limitations   Postural Limitations Forward head;Rounded Shoulders     ROM / Strength   AROM / PROM / Strength AROM;Strength     AROM   Overall AROM Comments grossly WNL     Strength   Overall Strength Comments grossly 4/5 throughout except L hip 3/5 due at least partially to pain   Strength Assessment Site Hand   Right/Left hand Right;Left   Right Hand Grip (lbs) 60  R hand dominant   Left Hand Grip (lbs) 56     Ambulation/Gait   Assistive device Straight cane   Gait Pattern Decreased stance time - left;Decreased step length - right;Antalgic   Gait Comments Pt demonstrates unsteadiness with turns and cllear antalgic gait pattern. Pt's gait distance limited by 60% for age/gender. Pt very slow with ambulation when he firsts arises from sitting (likely due to L hip  OA).           [facility] Pre-Surgical Assessment - 06/12/16 0001    5 Meter Walk Test- trial 1 9 sec   5 Meter Walk Test- trial 2 9 sec.    5 Meter Walk Test- trial 3 7 sec.   5 meter walk test average 8.33 sec   4 Stage Balance Test tolerated for:  10 sec.   4 Stage Balance Test Position 2   Comment unable without UE support due to L hip   ADL/IADL Independent with: Bathing;Dressing;Meal prep;Finances   ADL/IADL Needs Assistance with: [employer] Leaver work   ADL/IADL Fraility Index Vulnerable   6 Minute Walk- Baseline yes   BP (mmHg) 97/68   HR (bpm) 72   02 Sat (%RA) 97 %   Modified Borg Scale for Dyspnea 0- Nothing at all   Perceived Rate of Exertion (Borg) 6-   6 Minute Walk Post Test yes   BP (mmHg) 111/60   HR (bpm) 81   02 Sat (%RA) 99 %   Modified Borg Scale for Dyspnea 2- Mild shortness of breath   Perceived Rate of Exertion (Borg) 11- Fairly light   Aerobic Endurance Distance Walked 700          Objective measurements completed on examination: See above findings.                  PT Education - 06/12/16 1352    Education provided Yes   Education Details fall risk, benefit of walker versus cane   Person(s) Educated Patient   Methods Explanation   Comprehension Verbalized understanding                     Plan - 06/12/16 1352    Clinical Impression Statement see below   PT Frequency One time visit   Consulted and Agree with Plan of Care Patient     Clinical Impression Statement: Pt is an yo 33 presenting to OP PT for evaluation prior to possible TAVR surgery due to severe aortic stenosis. Pt reports onset of shortness of breath with activity, fatigue and dizziness approximately 12-18 months ago. Pt has has severe L hip OA and is awaiting surgery for hip replacement. Pt has become increasingly immobile in the past year. He is still able to work and ambulate to/from class using his single point cane. Pt presents with good ROM and mild weakness  except L hip demonstrates moderate weakness, poor balance and is at high fall risk 4 stage balance test, slow walking speed and poor aerobic endurance per 6 minute walk test. Pt  ambulated a total of 700 feet in 6 minute walk. He did demonstrate some unsteadiness with turns and a clear antalgic gait pattern due to his L hip pain which reached 4-5/10 pain during 6 minute walk. Based on the Short Physical Performance Battery, patient has a frailty rating of 3/12 with </= 5/12 considered frail.   Patient demonstrated the following deficits and impairments:     Visit Diagnosis: Other abnormalities of gait and mobility  Abnormal posture  Muscle weakness (generalized)      G-Codes - 2016/07/03 1353    Functional Assessment Tool Used (Outpatient Only) 6 minute walk with single point cane 700'   Functional Limitation Mobility: Walking and moving around   Mobility: Walking and Moving Around Current Status [phone]) At least 60 percent but less than 80 percent impaired, limited or restricted   Mobility: Walking and Moving Around Goal Status [phone]) At least 60 percent but less than 80 percent impaired, limited or restricted   Mobility: Walking and Moving Around Discharge Status [phone]) At least 60 percent but less than 80 percent impaired, limited or restricted       Problem List Patient Active Problem List   Diagnosis Date Noted  . Abnormal fractional flow reserve (FFR) on cardiac catheterization 06/05/2016  . Coronary artery disease due to calcified coronary lesion   . Benign essential HTN   . Bilateral hand pain 08/14/2015  . Arthritis of left hip 11/03/2014  . Elevated blood pressure 11/03/2014  . Left knee pain 10/06/2014  . Aortic stenosis 09/09/2013  . Bicuspid aortic valve 09/09/2013  . Family history of premature CAD 09/09/2013  . History of colonic polyps 05/12/2007  . HYPERLIPIDEMIA 05/12/2007  . INTERNAL HEMORRHOIDS 05/12/2007    Romualdo Bolk, PT 07/03/2016, 1:57 PM  [place], [place], [address] Phone: [phone]   Fax:  [phone]  Name: [name] [name] MRN: [id number] Date of Birth: Dec 19, 1935

## 2016-06-13 ENCOUNTER — Encounter: Payer: Self-pay | Admitting: Surgery

## 2016-06-13 NOTE — Progress Notes (Signed)
Patient ID: [name] [name], male   DOB: 1935-09-14, 81 y.o.   MRN: [id number]   [place] SURGERY CONSULTATION REPORT  Referring Provider is Sueanne Margarita, MD PCP is Maury Dus, MD  Chief Complaint  Patient presents with  . Aortic Stenosis    SEVERE...2ND TAVR EVAL...consulted with Dr. Roxy Manns on 05/27/16    HPI:  The patient is a 81 year old gentleman with hypertension, hyperlipidemia, severe DJD of the left hip that requires hip replacement and know bicuspid aortic valve stenosis. His AS has been followed for years by Dr. Radford Pax. His hip has deteriorated to the point that he requires hip replacement and he has seen Dr. Wynelle Link. He is ambulating with a cane but has a lot of difficulty. He has an echo on 05/07/2016 for preop evaluation and this showed progression to severe AS with a mean AV gradient of 45 mm Hg. LVEF is normal. He has been seen by Dr. Burt Knack and Dr. Roxy Manns for consideration of TAVR. He underwent cardiac cath on 05/29/2016 which showed a significant stenosis in the mid RCA with an FFR of 0.68 that was treated with a DES on 06/06/2016. He has been on DAPT since. He has not noted any improvement in his shortness of breath or fatigue with exertion since the stent was inserted.   He is a widower an lives alone. He is still working as a Educational psychologist at [employer]. He has been very active until he started having problems with his hip and now only walks when he has to. He has shortness of breath and fatigue with mild exertion and frequent dizzy spells. He has not had syncope. He denies any chest pain, orthopnea, or ankle edema. He has had some problems with short-term memory recently.  Past Medical History:  Diagnosis Date  . Actinic keratoses    Dr Glee Arvin  . Allergic rhinitis   . Aortic stenosis   . Benign essential HTN   . Bicuspid aortic valve   . Bilateral cataracts   . BPH (benign prostatic hypertrophy)    . Dilated aortic root ([place])    72mm by echo 08/2015  . Glaucoma   . H/O seasonal allergies   . Hx of adenomatous colonic polyps 1991 on  . Hx of cardiovascular stress test    Myoview (10/15):  normal  . Hyperlipidemia   . Mitral valve prolapse    mild MR by echo 08/2015  . Paresthesia    Chronic left lateral thigh    Past Surgical History:  Procedure Laterality Date  . COLONOSCOPY  multiple  . CORONARY STENT INTERVENTION N/A 06/06/2016   Procedure: Coronary Stent Intervention;  Surgeon: [name], MD;  Location: [facility];  Service: Cardiovascular;  Laterality: N/A;  . INTRAVASCULAR PRESSURE WIRE/FFR STUDY N/A 05/29/2016   Procedure: Intravascular Pressure Wire/FFR Study;  Surgeon: [name], MD;  Location: [facility];  Service: Cardiovascular;  Laterality: N/A;  . RIGHT/LEFT HEART CATH AND CORONARY ANGIOGRAPHY N/A 05/29/2016   Procedure: Right/Left Heart Cath and Coronary Angiography;  Surgeon: [name], MD;  Location: [facility];  Service: Cardiovascular;  Laterality: N/A;  . TONSILLECTOMY  1949    Family History  Problem Relation Age of Onset  . Emphysema Mother   . Pancreatic cancer Father   . Heart attack Brother   . Heart disease Brother   . Colon cancer Neg Hx   . Stomach cancer  Neg Hx     Social History   Social History  . Marital status: Widowed    Spouse name: N/A  . Number of children: N/A  . Years of education: N/A   Occupational History  . Not on file.   Social History Main Topics  . Smoking status: Former Smoker    Quit date: 07/14/1960  . Smokeless tobacco: Never Used  . Alcohol use 3.0 oz/week    5 Cans of beer per week  . Drug use: No  . Sexual activity: Not on file   Other Topics Concern  . Not on file   Social History Narrative  . No narrative on file    Current Outpatient Prescriptions  Medication Sig Dispense Refill  . acetaminophen (TYLENOL) 500 MG tablet Take 1 tablet (500 mg total) by  mouth every 6 (six) hours as needed for moderate pain. 30 tablet 0  . Ascorbic Acid (VITAMIN C) 1000 MG tablet Take 1,000 mg by mouth daily.    Marland Kitchen aspirin 81 MG tablet Take 81 mg by mouth every evening.     . clopidogrel (PLAVIX) 75 MG tablet Take 1 tablet (75 mg total) by mouth daily. 30 tablet 11  . fluticasone (FLONASE) 50 MCG/ACT nasal spray Place 2 sprays into both nostrils daily as needed for allergies or rhinitis.    . Glucosamine-Chondroit-Vit C-Mn (GLUCOSAMINE CHONDR 1500 COMPLX PO) Take 1 tablet by mouth daily.    Marland Kitchen latanoprost (XALATAN) 0.005 % ophthalmic solution Place 1 drop into both eyes at bedtime.    Marland Kitchen losartan (COZAAR) 100 MG tablet Take 100 mg by mouth daily.    . Omega-3 Fatty Acids (FISH OIL) 500 MG CAPS Take 500 mg by mouth daily.    . pravastatin (PRAVACHOL) 40 MG tablet Take 40 mg by mouth every evening.     . timolol (BETIMOL) 0.5 % ophthalmic solution Place 1 drop into both eyes 2 (two) times daily.     No current facility-administered medications for this visit.     No Known Allergies    Review of Systems:   General:  reduced appetite, poor energy, no weight gain, 5 lb weight loss over the past 6 months which he feels is due to change in his diet, no fever  Cardiac:  no chest pain with exertion, no chest pain at rest, has SOB with mild exertion, no resting SOB, no PND, no orthopnea, no palpitations, no arrhythmia, no atrial fibrillation, no LE edema, has dizzy spells, no syncope  Respiratory:  exertional shortness of breath, no home oxygen, no productive cough, no dry cough, no bronchitis, no wheezing, no hemoptysis, on asthma, no pain with inspiration or cough, no sleep apnea, no CPAP at night  GI:   no difficulty swallowing, no reflux, no frequent heartburn, no hiatal hernia, no abdominal pain, no constipation, no diarrhea, no hematochezia, no hematemesis, no melena  GU:   no dysuria,  no frequency, no urinary tract infection, no hematuria, no enlarged prostate,  no kidney stones, no kidney disease  Vascular:  no pain suggestive of claudication, no pain in feet, no leg cramps, no varicose veins, no DVT, no non-healing foot ulcer  Neuro:   no stroke, no TIA's, no seizures, has headaches, no temporary blindness one eye,  no slurred speech, no peripheral neuropathy, no chronic pain, some instability of gait due to left hip arthritis, some memory/cognitive dysfunction  Musculoskeletal: Left hip arthritis, no joint swelling, no myalgias, has difficulty walking, reduced mobility   Skin:  no rash, no itching, no skin infections, no pressure sores or ulcerations  Psych:   no anxiety, no depression, no nervousness, no unusual recent stress  Eyes:   no blurry vision, no floaters, no recent vision changes,  wears glasses  ENT:   no hearing loss, no loose or painful teeth, no dentures, last saw dentist this year  Hematologic:  no easy bruising, no abnormal bleeding, no clotting disorder, no frequent epistaxis  Endocrine:  no diabetes, does not check CBG's at home      Physical Exam:   BP 106/62   Pulse 70   Resp 16   Ht 5\' 9"  (1.753 m)   Wt 140 lb (63.5 kg)   SpO2 96% Comment: ON RA  BMI 20.67 kg/m   General:  Elderly and frail-appearing  HEENT:  Unremarkable, NCAT, PERLA, EOMI, oropharynx clear, teeth in good condition  Neck:   no JVD, no bruits, no adenopathy or thyromegaly  Chest:   clear to auscultation, symmetrical breath sounds, no wheezes, no rhonchi   CV:   RRR, grade III/VI crescendo/decrescendo murmur heard best at RSB,  no diastolic murmur  Abdomen:  soft, non-tender, no masses or organomegaly  Extremities:  warm, well-perfused, pulses palpable in feet, no LE edema  Rectal/GU  Deferred  Neuro:   Grossly non-focal and symmetrical throughout  Skin:   Clean and dry, no rashes, no breakdown   Diagnostic Tests:       [facility]                        [address], [place] [address]                             [phone]  ------------------------------------------------------------------- Transthoracic Echocardiography  Patient:    Yussuf, Sawyers MR #:       [id number] Study Date: 05/07/2016 Gender:     M Age:        17 Height:     175.3 cm Weight:     66.7 kg BSA:        1.8 m^2 Pt. Status: Room:   ATTENDING    Fransico Him, MD  ORDERING     Fransico Him, MD  REFERRING    Fransico Him, MD  SONOGRAPHER  Cindy Hazy, RDCS  PERFORMING   [facility], Outpatient  cc:  ------------------------------------------------------------------- LV EF: 60% -   65%  ------------------------------------------------------------------- Indications:      I35.9 Aortic Valve Disorder.  ------------------------------------------------------------------- History:   PMH:  Acquired from the patient and from the patient&'s chart.  PMH:  Bicuspid Aortic Valve. Dilated Aortic Root. MVP. Risk factors:  Hypertension. Dyslipidemia.  ------------------------------------------------------------------- Study Conclusions  - Left ventricle: The cavity size was normal. Wall thickness was   increased in a pattern of mild LVH. Systolic function was normal.   The estimated ejection fraction was in the range of 60% to 65%.   Wall motion was normal; there were no regional wall motion   abnormalities. Doppler parameters are consistent with abnormal   left ventricular relaxation (grade 1 diastolic dysfunction). - Aortic valve: Mildly calcified annulus. Severely thickened,   moderately calcified leaflets. There was severe stenosis. There   was mild regurgitation. - Mitral valve: Mildly calcified annulus. - Left atrium: The  atrium was severely dilated. - Right atrium: The atrium was mildly dilated. - Pulmonary arteries: Systolic pressure was moderately increased.   PA peak pressure: 41 mm Hg (S). - Pericardium, extracardiac: A small, free-flowing pericardial   effusion was identified  circumferential to the heart.  ------------------------------------------------------------------- Study data:   Study status:  Routine.  Procedure:  The patient reported no pain pre or post test. Transthoracic echocardiography for left ventricular function evaluation, for right ventricular function evaluation, and for assessment of valvular function. Image quality was adequate.  Study completion:  There were no complications.          Transthoracic echocardiography.  M-mode, complete 2D, spectral Doppler, and color Doppler.  Birthdate: Patient birthdate: [birth year].  Age:  Patient is 81 yr old.  Sex: Gender: male.    BMI: 21.7 kg/m^2.  Blood pressure:     112/58 Patient status:  Outpatient.  Study date:  Study date: 05/07/2016. Study time: 04:05 PM.  Location:  [facility]  -------------------------------------------------------------------  ------------------------------------------------------------------- Left ventricle:  The cavity size was normal. Wall thickness was increased in a pattern of mild LVH. Systolic function was normal. The estimated ejection fraction was in the range of 60% to 65%. Wall motion was normal; there were no regional wall motion abnormalities. Doppler parameters are consistent with abnormal left ventricular relaxation (grade 1 diastolic dysfunction).  ------------------------------------------------------------------- Aortic valve:   Mildly calcified annulus. Severely thickened, moderately calcified leaflets.  Doppler:   There was severe stenosis.   There was mild regurgitation.    VTI ratio of LVOT to aortic valve: 0.24. Valve area (VTI): 0.93 cm^2. Indexed valve area (VTI): 0.52 cm^2/m^2. Peak velocity ratio of LVOT to aortic valve: 0.25. Valve area (Vmax): 0.93 cm^2. Indexed valve area (Vmax): 0.52 cm^2/m^2. Mean velocity ratio of LVOT to aortic valve: 0.24. Valve area (Vmean): 0.9 cm^2. Indexed valve area (Vmean): 0.5 cm^2/m^2.  Mean  gradient (S): 45 mm Hg. Peak gradient (S): 76 mm Hg.  ------------------------------------------------------------------- Aorta:  Aortic root: The aortic root was normal in size. Ascending aorta: The ascending aorta was normal in size.  ------------------------------------------------------------------- Mitral valve:   Mildly calcified annulus.  Doppler:  There was trivial regurgitation.    Peak gradient (D): 3 mm Hg.  ------------------------------------------------------------------- Left atrium:  The atrium was severely dilated.  ------------------------------------------------------------------- Right ventricle:  The cavity size was normal. Systolic function was normal.  ------------------------------------------------------------------- Pulmonic valve:    The valve appears to be grossly normal. Doppler:  There was mild regurgitation.  ------------------------------------------------------------------- Tricuspid valve:   Structurally normal valve.   Leaflet separation was normal.  Doppler:  Transvalvular velocity was within the normal range. There was mild regurgitation.  ------------------------------------------------------------------- Pulmonary artery:   Systolic pressure was moderately increased.  ------------------------------------------------------------------- Right atrium:  The atrium was mildly dilated.  ------------------------------------------------------------------- Pericardium:  A small, free-flowing pericardial effusion was identified circumferential to the heart.  ------------------------------------------------------------------- Systemic veins: Inferior vena cava: The vessel was normal in size. The respirophasic diameter changes were in the normal range (>= 50%), consistent with normal central venous pressure.  ------------------------------------------------------------------- Measurements   Left ventricle                             Value          Reference  LV ID, ED, PLAX chordal           (L)     42.8  mm       43 -  52  LV ID, ES, PLAX chordal                   24.6  mm       23 - 38  LV fx shortening, PLAX chordal            43    %        >=29  LV PW thickness, ED                       12    mm       ---------  IVS/LV PW ratio, ED                       1.08           <=1.3  Stroke volume, 2D                         100   ml       ---------  Stroke volume/bsa, 2D                     56    ml/m^2   ---------  LV e&', lateral                            7.02  cm/s     ---------  LV E/e&', lateral                          13.15          ---------  LV e&', medial                             6.36  cm/s     ---------  LV E/e&', medial                           14.51          ---------  LV e&', average                            6.69  cm/s     ---------  LV E/e&', average                          13.8           ---------    Ventricular septum                        Value          Reference  IVS thickness, ED                         13    mm       ---------    LVOT                                      Value          Reference  LVOT ID, S  22    mm       ---------  LVOT area                                 3.8   cm^2     ---------  LVOT ID                                   22    mm       ---------  LVOT peak velocity, S                     107   cm/s     ---------  LVOT mean velocity, S                     75.9  cm/s     ---------  LVOT VTI, S                               26.2  cm       ---------  LVOT peak gradient, S                     5     mm Hg    ---------  Stroke volume (SV), LVOT DP               99.6  ml       ---------  Stroke index (SV/bsa), LVOT DP            55.3  ml/m^2   ---------    Aortic valve                              Value          Reference  Aortic valve peak velocity, S             436   cm/s     ---------  Aortic valve mean velocity, S             319   cm/s      ---------  Aortic valve VTI, S                       107   cm       ---------  Aortic mean gradient, S                   45    mm Hg    ---------  Aortic peak gradient, S                   76    mm Hg    ---------  VTI ratio, LVOT/AV                        0.24           ---------  Aortic valve area, VTI                    0.93  cm^2     ---------  Aortic valve area/bsa, VTI  0.52  cm^2/m^2 ---------  Velocity ratio, peak, LVOT/AV             0.25           ---------  Aortic valve area, peak velocity          0.93  cm^2     ---------  Aortic valve area/bsa, peak               0.52  cm^2/m^2 ---------  velocity  Velocity ratio, mean, LVOT/AV             0.24           ---------  Aortic valve area, mean velocity          0.9   cm^2     ---------  Aortic valve area/bsa, mean               0.5   cm^2/m^2 ---------  velocity  Aortic regurg pressure half-time          555   ms       ---------    Aorta                                     Value          Reference  Aortic root ID, ED                        39    mm       ---------  Ascending aorta ID, A-P, S                33    mm       ---------    Left atrium                               Value          Reference  LA ID, A-P, ES                            41    mm       ---------  LA ID/bsa, A-P                    (H)     2.28  cm/m^2   <=2.2  LA volume, S                              95    ml       ---------  LA volume/bsa, S                          52.8  ml/m^2   ---------  LA volume, ES, 1-p A4C                    97    ml       ---------  LA volume/bsa, ES, 1-p A4C                53.9  ml/m^2   ---------  LA volume, ES, 1-p A2C  83    ml       ---------  LA volume/bsa, ES, 1-p A2C                46.1  ml/m^2   ---------    Mitral valve                              Value          Reference  Mitral E-wave peak velocity               92.3  cm/s     ---------  Mitral A-wave peak velocity               111    cm/s     ---------  Mitral deceleration time          (H)     289   ms       150 - 230  Mitral peak gradient, D                   3     mm Hg    ---------  Mitral E/A ratio, peak                    0.8            ---------    Pulmonary arteries                        Value          Reference  PA pressure, S, DP                (H)     41    mm Hg    <=30    Tricuspid valve                           Value          Reference  Tricuspid regurg peak velocity            307   cm/s     ---------  Tricuspid peak RV-RA gradient             38    mm Hg    ---------    Right ventricle                           Value          Reference  RV s&', lateral, S                         16.8  cm/s     ---------  Legend: (L)  and  (H)  mark values outside specified reference range.  ------------------------------------------------------------------- Prepared and Electronically Authenticated by  Mertie Moores, M.D. 2018-04-24T17:19:39  Physicians   Panel Physicians Referring Physician Case Authorizing Physician  [name], MD (Primary)    Procedures   Intravascular Pressure Wire/FFR Study  Right/Left Heart Cath and Coronary Angiography  Conclusion   1. Severe calcific aortic valve stenosis with calculated AVA 1.0 square cm 2. Severe single vessel CAD involving the mid-portion of a medium caliber dominant RCA, hemodynamically significant by FFR (0.68) 3. Moderate LAD stenosis with negative FFR analysis 4. Mild-Moderate stenosis of the ramus intermedius 5. Normal  LV systolic function 6. No evidence of significant left-to-right shunt with QP:QS 1:1  Plan: Continued evaluation by the multidisciplinary heart valve team    Physicians   Panel Physicians Referring Physician Case Authorizing Physician  [name], MD (Primary)    Procedures   Coronary Stent Intervention  Conclusion   Successful single vessel PCI using a 2.25 x 16 mid or Synergy DES  Recommendation: Patient  will continue on dual antiplatelet therapy with aspirin and clopidogrel. Anticipate proceeding with TAVR in the near future. Pt will be discharged later today as long as no complications arise.     ADDENDUM REPORT: 05/24/2016 17:41  CLINICAL DATA:  81 year old male with severe aortic stenosis.  EXAM: Cardiac TAVR CT  TECHNIQUE: The patient was scanned on a Philips 256 scanner. A 120 kV retrospective scan was triggered in the descending thoracic aorta at 111 HU's. Gantry rotation speed was 270 msecs and collimation was .9 mm. No beta blockade or nitro were given. The 3D data set was reconstructed in 5% intervals of the R-R cycle. Systolic and diastolic phases were analyzed on a dedicated work station using MPR, MIP and VRT modes. The patient received 80 cc of contrast.  FINDINGS: Aortic Valve: Severely thickened and calcified. The valve is tricuspid but functionally bicuspid with partially grown left and right coronary cusp. There are minimal calcifications extending into the LVOT.  Aorta:  Normal size. Mild diffuse calcifications.  No dissection.  Sinotubular Junction:  31 x 31 mm  Ascending Thoracic Aorta:  34 x 33 mm  Aortic Arch:  30 x 28 mm  Descending Thoracic Aorta:  28 x 27 mm  Sinus of Valsalva Measurements:  Non-coronary:  42 mm  Right -coronary:  39 mm  Left -coronary:  40 mm  Coronary Artery Height above Annulus:  Left Main:  15 mm  Right Coronary:  16 mm  Virtual Basal Annulus Measurements:  Maximum/Minimum Diameter:  32 x 24 mm  Perimeter:  104 mm  Area:  635 mm2  Coronary Arteries: Normal origin. The study not technically sufficient for plaque evaluation.  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  Optimum Fluoroscopic Angle for Delivery:  LAO 13 CAU 10  Normal pulmonary vein drainage into the left atrium.  No thrombus in a large left atrial appendage.  Dilated  pulmonary artery measuring 31 x 29 mm consistent with pulmonary hypertension.  IMPRESSION: 1. The valve is tricuspid but functionally bicuspid with partially grown left and right coronary cusp. The valve is severely thickened and calcified with severe restriction in leaflet opening. Annular measurements suitable for delivery of a 29 mm Edwards-SAPIEN 3 valve.  2. Sufficient annulus to coronary distance.  3. Optimum Fluoroscopic Angle for Delivery:  LAO 13 CAU 10.  4. No left atrial thrombus.  5. Dilated pulmonary artery measuring 31 x 29 mm consistent with pulmonary hypertension.  6. 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  Ena Dawley   Electronically Signed   By: Ena Dawley   On: 05/24/2016 17:41   Addended by Dorothy Spark, MD on 05/24/2016 5:43 PM    Study Result   EXAM: OVER-READ INTERPRETATION  CT CHEST  The following report is an over-read performed by radiologist Dr. Rebekah [facility] on 05/22/2016. This over-read does not include interpretation of cardiac or coronary anatomy or pathology. The coronary calcium score/coronary CTA interpretation by the cardiologist is attached.  COMPARISON:  Chest CT 09/28/2015.  FINDINGS: Extracardiac findings will be described separately under report for the contemporaneously obtained CTA of the chest, abdomen and pelvis dated 05/22/2016.  IMPRESSION: Please see separately dictated report for contemporaneously obtained CTA of the chest, abdomen and pelvis dated 05/22/2016 for full description of relevant extracardiac findings.  Electronically Signed: By: Vinnie Langton M.D. On: 05/22/2016 11:21       CLINICAL DATA:  81 year old male with history of severe aortic stenosis. Preprocedural study prior to potential transcatheter aortic valve replacement (TAVR) procedure.  EXAM: CT ANGIOGRAPHY CHEST, ABDOMEN AND  PELVIS  TECHNIQUE: Multidetector CT imaging through the chest, abdomen and pelvis was performed using the standard protocol during bolus administration of intravenous contrast. Multiplanar reconstructed images and MIPs were obtained and reviewed to evaluate the vascular anatomy.  CONTRAST:  75 mL of Isovue 370.  COMPARISON:  Chest CT 09/28/2015.  FINDINGS: CTA CHEST FINDINGS  Cardiovascular: Heart size is borderline enlarged. There is no significant pericardial fluid, thickening or pericardial calcification. There is aortic atherosclerosis, as well as atherosclerosis of the great vessels of the mediastinum and the coronary arteries, including calcified atherosclerotic plaque in the left main, left anterior descending, left circumflex and right coronary arteries. Severe thickening calcification of the aortic valve root.  Mediastinum/Lymph Nodes: No pathologically enlarged mediastinal or hilar lymph nodes. Esophagus is unremarkable in appearance. No axillary lymphadenopathy.  Lungs/Pleura: No suspicious appearing pulmonary nodules or masses. No acute consolidative airspace disease. No pleural effusions.  Musculoskeletal/Soft Tissues: There are no aggressive appearing lytic or blastic lesions noted in the visualized portions of the skeleton.  CTA ABDOMEN AND PELVIS FINDINGS  Hepatobiliary: 11 mm low-attenuation lesion in segment 2 of the liver is compatible with a simple cyst. No other suspicious appearing hepatic lesions. No intra or extrahepatic biliary ductal dilatation. Gallbladder is normal in appearance.  Pancreas: No pancreatic mass. No pancreatic ductal dilatation. No pancreatic or peripancreatic fluid or inflammatory changes.  Spleen: Unremarkable.  Adrenals/Urinary Tract: Bilateral kidneys and bilateral adrenal glands are normal in appearance. There is no hydroureteronephrosis. Urinary bladder wall appears diffusely thickened and  heavily trabeculated. Several small bladder wall diverticulae are noted. The largest of these is on the left side posteriorly measuring up to 1.5 cm in diameter. Notably, within this diverticulum there is a small filling defect measuring 6 mm (axial image 282 of series 5 and coronal image 42 of series 8) which could simply reflect some internal debris, however, the possibility of a small urothelial neoplasm should be considered.  Stomach/Bowel: The appearance of the stomach is normal. There is no pathologic dilatation of small bowel or colon. Several colonic diverticulae are noted, particularly in the region of the descending colon and sigmoid colon, without surrounding inflammatory changes to suggest an acute diverticulitis at this time. Normal appendix.  Vascular/Lymphatic: Aortic atherosclerosis, with vascular findings and measurements pertinent to potential TAVR procedure, as detailed below. No aneurysm or dissection noted in the abdominal or pelvic vasculature. Mild narrowing of the ostium of the celiac axis related to compression from the overlying diaphragmatic crus. Remaining portions of the celiac axis, superior mesenteric artery and inferior mesenteric artery are all widely patent without hemodynamically significant stenosis. Single renal arteries bilaterally appear widely patent without hemodynamically significant stenosis. There is also moderate to severe stenosis of the proximal left superficial femoral artery immediately after the bifurcation of the left common femoral artery where the vessel measures only 5.6 x 1.9 mm. No lymphadenopathy noted in the abdomen or pelvis.  Reproductive: Prostate  gland is enlarged with severe median lobe hypertrophy, measuring 4.4 x 6.0 x 7.2 cm. Seminal vesicles are unremarkable in appearance. Bilateral hydroceles (right greater than left) moderate in size.  Other: No significant volume of ascites.  No  pneumoperitoneum.  Musculoskeletal: There are no aggressive appearing lytic or blastic lesions noted in the visualized portions of the skeleton.  VASCULAR MEASUREMENTS PERTINENT TO TAVR:  AORTA:  Minimal Aortic Diameter -  16 x 15 mm  Severity of Aortic Calcification -  moderate  RIGHT PELVIS:  Right Common Iliac Artery -  Minimal Diameter - 6.5 x 8.2 mm  Tortuosity - mild  Calcification - moderate  Right External Iliac Artery -  Minimal Diameter - 6.6 x 2.0 mm  Tortuosity - moderate  Calcification - minimal  Right Common Femoral Artery -  Minimal Diameter - 6.4 x 8.7 mm  Tortuosity - mild  Calcification - mild  LEFT PELVIS:  Left Common Iliac Artery -  Minimal Diameter - 9.3 x 8.2 mm  Tortuosity - mild  Calcification - moderate  Left External Iliac Artery -  Minimal Diameter - 7.1 x 8.2 mm  Tortuosity - moderate  Calcification - none  Left Common Femoral Artery -  Minimal Diameter - 8.0 x 7.4 mm  Tortuosity - mild  Calcification - mild  Review of the MIP images confirms the above findings.  IMPRESSION: 1. Vascular findings and measurements pertinent to potential TAVR procedure, as detailed above. The patient does appear to have suitable pelvic arterial access bilaterally. 2. Severe thickening calcification of the aortic valve, compatible with the reported clinical history of severe aortic stenosis. 3. **An incidental finding of potential clinical significance has been found. Changes in the urinary bladder suggestive of chronic bladder outlet obstruction, including a thickened and heavily trabeculated wall with multiple small diverticulae. Notably, there is a diverticulum along the posterior aspect of the urinary bladder on the left side which has a small 6 mm filling defect. While this could simply represent some retained debris, the possibility of a small urothelial neoplasm should be considered, and  nonemergent Urologic consultation is recommended in the near future to better evaluate this finding.** 4. Aortic atherosclerosis, in addition to left main and 3 vessel coronary artery disease. Assessment for potential risk factor modification, dietary therapy or pharmacologic therapy may be warranted, if clinically indicated. 5. Colonic diverticulosis without evidence of acute diverticulitis at this time. 6. Additional incidental findings, as above.   Electronically Signed   By: Vinnie Langton M.D.   On: 05/22/2016 11:37  Impression:  This 81 year old gentleman has stage D severe symptomatic aortic stenosis with NYHA class II symptoms of exertional fatigue and shortness of breath as well as frequent dizzy spells, consistent with chronic diastolic congestive heart failure. He had a significant stenosis in the mid RCA that has been treated with a DES but never had any chest pain. I have personally reviewed his echo, cath and CTA studies. He has a functionally bicuspid aortic valve with severe stenosis and a mean gradient of 45 mm Hg. There is severe leaflet calcification and thickening with restricted mobility. I agree that AVR is indicated in this patient and that should improve his symptoms and allow him to have a left hip replacement which is severely limiting his mobility. His calculated STS PROM for open surgical AVR is only 1.9% but I think his risk is really considerably higher and I estimate it is over 4% due to his age, severe DJD with markedly difficult  ambulation and signs of early dementia. I think TAVR is a better option for him with less risk and a quicker recovery. His cardiac CT shows anatomy favorable for a Sapien 3 valve with no complicating features. His abdominal and pelvic CT shows adequate vascular anatomy for a transfemoral insertion.   The patient was counseled at length regarding treatment alternatives for management of severe symptomatic aortic stenosis. The risks and  benefits of surgical intervention have been discussed in detail. Long-term prognosis with medical therapy was discussed. Alternative approaches such as conventional surgical aortic valve replacement, transcatheter aortic valve replacement, and palliative medical therapy were compared and contrasted at length. This discussion was placed in the context of the patient's own specific clinical presentation and past medical history. All of his questions have been addressed.   Following the decision to proceed with transcatheter aortic valve replacement, a discussion was held regarding what types of management strategies would be attempted intraoperatively in the event of life-threatening complications, including whether or not the patient would be considered a candidate for the use of cardiopulmonary bypass and/or conversion to open sternotomy for attempted surgical intervention. The patient has been advised of a variety of complications that might develop including but not limited to risks of death, stroke, paravalvular leak, aortic dissection or other major vascular complications, aortic annulus rupture, device embolization, cardiac rupture or perforation, mitral regurgitation, acute myocardial infarction, arrhythmia, heart block or bradycardia requiring permanent pacemaker placement, congestive heart failure, respiratory failure, renal failure, pneumonia, infection, other late complications related to structural valve deterioration or migration, or other complications that might ultimately cause a temporary or permanent loss of functional independence or other long term morbidity. The patient provides full informed consent for the procedure as described and all questions were answered.     Plan:  Transfemoral TAVR by Dr. Burt Knack and Dr. Roxy Manns. The surgery will be scheduled on 07/02/2016. He will continue the Plavix and ASA for his DES through the time of surgery.   I spent 60 minutes performing this  consultation and > 50% of this time was spent face to face counseling and coordinating the care of this patient's severe aortic stenosis.    Gaye Pollack, MD 06/12/2016

## 2016-06-14 ENCOUNTER — Encounter: Payer: Self-pay | Admitting: Physician Assistant

## 2016-06-14 ENCOUNTER — Ambulatory Visit (INDEPENDENT_AMBULATORY_CARE_PROVIDER_SITE_OTHER): Payer: BC Managed Care – PPO | Admitting: Physician Assistant

## 2016-06-14 VITALS — BP 120/60 | HR 61 | Ht 69.0 in | Wt 130.4 lb

## 2016-06-14 DIAGNOSIS — I2510 Atherosclerotic heart disease of native coronary artery without angina pectoris: Secondary | ICD-10-CM

## 2016-06-14 DIAGNOSIS — N3289 Other specified disorders of bladder: Secondary | ICD-10-CM

## 2016-06-14 DIAGNOSIS — N323 Diverticulum of bladder: Secondary | ICD-10-CM | POA: Insufficient documentation

## 2016-06-14 DIAGNOSIS — E785 Hyperlipidemia, unspecified: Secondary | ICD-10-CM

## 2016-06-14 DIAGNOSIS — I10 Essential (primary) hypertension: Secondary | ICD-10-CM | POA: Diagnosis not present

## 2016-06-14 DIAGNOSIS — I350 Nonrheumatic aortic (valve) stenosis: Secondary | ICD-10-CM | POA: Diagnosis not present

## 2016-06-14 HISTORY — DX: Diverticulum of bladder: N32.3

## 2016-06-14 NOTE — Progress Notes (Signed)
Cardiology Office Note:    Date:  06/14/2016   ID:  SKIP LITKE, DOB Dec 05, 1935, MRN [id number]  PCP:  Maury Dus, MD  Cardiologist:  Dr. Fransico Him   TAVR:  Dr. [name]  Urologist: Dr. Tresa Endo  Referring MD: Maury Dus, MD   Chief Complaint  Patient presents with  . 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 with a hx of bicuspid aortic valve with aortic stenosis as well as MVP with mild MR, osteoarthritis of the L hip, HTN, HL.  He was evaluated by Dr. [name] in 04/2016 for severe aortic stenosis.  LHC on 05/29/16 demonstrated nonobstructive disease in LAD and ramus intermediate. He had hemodynamically significant mid RCA stenosis by FFR. He returned to the hospital on 06/06/16 for planned PCI of the RCA. RCA was treated successfully with DES. He was discharged later that day (same day DC). Of note, CT angiogram of the abdomen and pelvis in 05/2016 didn't demonstrate chronic bladder outlet obstruction including a thickened and heavily trabeculated wall with multiple small diverticuli.   Mr. Cuccaro returns for post hospitalization follow up.  He is here alone.  He has a lot of knee pain from osteoarthritis.  He denies chest pain, significant shortness of breath, orthopnea, PND, edema.  He has occasional dizziness with standing.  He denies syncope.  He denies bleeding issues.   Prior CV studies:   The following studies were reviewed today:  PCI 06/06/16 LAD proximal 60 (FFR 0.85-negative) RI 50 LCx calcified without significant stenosis RCA mid 80 (FFR 0.68-hemodynamically significant) PCI: 2.25 x 16 mm Synergy DES to the mid RCA  Carotid US 05/30/16 R 40-59; L 60-79  LHC 05/29/16 LAD proximal 60-FFR 0.5 RI 50 excellent LCx calcified RCA mid 80-FFR 0.68 EF 55-65, mean gradient 27, AVA 1.0 cm, AVA index or 0.57  Coronary CTA 05/22/16 IMPRESSION: 1. The  valve is tricuspid but functionally bicuspid with partially grown left and right coronary cusp. The valve is severely thickened and calcified with severe restriction in leaflet opening. Annular measurements suitable for delivery of a 29 mm Edwards-SAPIEN 3 valve. 2. Sufficient annulus to coronary distance. 3. Optimum Fluoroscopic Angle for Delivery:  LAO 13 CAU 10. 4. No left atrial thrombus. 5. Dilated pulmonary artery measuring 31 x 29 mm consistent with pulmonary hypertension. 6. 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  Abdominal and pelvic CTA 05/22/16 IMPRESSION: 1. Vascular findings and measurements pertinent to potential TAVR procedure, as detailed above. The patient does appear to have suitable pelvic arterial access bilaterally. 2. Severe thickening calcification of the aortic valve, compatible with the reported clinical history of severe aortic stenosis. 3. **An incidental finding of potential clinical significance has been found. Changes in the urinary bladder suggestive of chronic bladder outlet obstruction, including a thickened and heavily trabeculated wall with multiple small diverticulae. Notably, there is a diverticulum along the posterior aspect of the urinary bladder on the left side which has a small 6 mm filling defect. While this could simply represent some retained debris, the possibility of a small urothelial neoplasm should be considered, and nonemergent Urologic consultation is recommended in the near future to better evaluate this finding.** 4. Aortic atherosclerosis, in addition to left main and 3 vessel coronary artery disease. Assessment for potential risk factor modification, dietary therapy or pharmacologic therapy may  be warranted, if clinically indicated. 5. Colonic diverticulosis without evidence of acute diverticulitis at this time. 6. Additional incidental findings, as above.  Echo 05/07/16 Mild LVH, EF  60-65, Gr 1 DD, severe aortic stenosis (mean 45, peak 76), MAC, severe LAE, mild RAE, mildly increased PASP (41), small pericardial effusion  Past Medical History:  Diagnosis Date  . Actinic keratoses    Dr Glee Arvin  . Allergic rhinitis   . Aortic stenosis   . Benign essential HTN   . Bicuspid aortic valve   . Bilateral cataracts   . BPH (benign prostatic hypertrophy)   . Coronary artery disease involving native coronary artery of native heart without angina pectoris    PCI 06/06/16 LAD proximal 60 (FFR 0.85-negative); RI 50; LCx calcified without significant stenosis; RCA mid 80 (FFR 0.68-hemodynamically significant); PCI: 2.25 x 16 mm Synergy DES to the mid RCA  . Dilated aortic root ([place])    11m by echo 08/2015  . Glaucoma   . H/O seasonal allergies   . Hx of adenomatous colonic polyps 1991 on  . Hx of cardiovascular stress test    Myoview (10/15):  normal  . Hyperlipidemia   . Mitral valve prolapse    mild MR by echo 08/2015  . Paresthesia    Chronic left lateral thigh    Past Surgical History:  Procedure Laterality Date  . COLONOSCOPY  multiple  . CORONARY STENT INTERVENTION N/A 06/06/2016   Procedure: Coronary Stent Intervention;  Surgeon: CSherren Mocha MD;  [facility];  Service: Cardiovascular;  Laterality: N/A;  . INTRAVASCULAR PRESSURE WIRE/FFR STUDY N/A 05/29/2016   Procedure: Intravascular Pressure Wire/FFR Study;  Surgeon: CSherren Mocha MD;  [facility];  Service: Cardiovascular;  Laterality: N/A;  . RIGHT/LEFT HEART CATH AND CORONARY ANGIOGRAPHY N/A 05/29/2016   Procedure: Right/Left Heart Cath and Coronary Angiography;  Surgeon: CSherren Mocha MD;  [facility];  Service: Cardiovascular;  Laterality: N/A;  . TONSILLECTOMY  1949    Current Medications: Current Meds  Medication Sig  . acetaminophen (TYLENOL) 500 MG tablet Take 1 tablet (500 mg total) by mouth every 6 (six) hours as needed for moderate  pain.  . Ascorbic Acid (VITAMIN C) 1000 MG tablet Take 1,000 mg by mouth daily.  .Marland Kitchenaspirin 81 MG tablet Take 81 mg by mouth every evening.   . clopidogrel (PLAVIX) 75 MG tablet Take 1 tablet (75 mg total) by mouth daily.  . fluticasone (FLONASE) 50 MCG/ACT nasal spray Place 2 sprays into both nostrils daily as needed for allergies or rhinitis.  . Glucosamine-Chondroit-Vit C-Mn (GLUCOSAMINE CHONDR 1500 COMPLX PO) Take 1 tablet by mouth daily.  .Marland Kitchenlatanoprost (XALATAN) 0.005 % ophthalmic solution Place 1 drop into both eyes at bedtime.  .Marland Kitchenlosartan (COZAAR) 100 MG tablet Take 100 mg by mouth daily.  . Omega-3 Fatty Acids (FISH OIL) 500 MG CAPS Take 500 mg by mouth daily.  . pravastatin (PRAVACHOL) 40 MG tablet Take 40 mg by mouth every evening.   . timolol (BETIMOL) 0.5 % ophthalmic solution Place 1 drop into both eyes 2 (two) times daily.     Allergies:   Patient has no known allergies.   Social History   Social History  . Marital status: Widowed    Spouse name: N/A  . Number of children: N/A  . Years of education: N/A   Social History Main Topics  . Smoking status: Former Smoker    Quit date: 07/14/1960  .  Smokeless tobacco: Never Used  . Alcohol use 3.0 oz/week    5 Cans of beer per week  . Drug use: No  . Sexual activity: Not Asked   Other Topics Concern  . None   Social History Narrative  . None     Family Hx: The patient's family history includes Emphysema in his mother; Heart attack in his brother; Heart disease in his brother; Pancreatic cancer in his father. There is no history of Colon cancer or Stomach cancer.  ROS:   Please see the history of present illness.    Review of Systems  Constitution: Positive for malaise/fatigue.  Neurological: Positive for dizziness.   All other systems reviewed and are negative.   EKGs/Labs/Other Test Reviewed:    EKG:  EKG is  ordered today.  The ekg ordered today demonstrates NSR, HR 61, LAD, anterior Q waves, PVCs, QTc 430 ms,  no changes.   Recent Labs: 05/29/2016: Hemoglobin 14.3 06/04/2016: BUN 13; Creatinine, Ser 0.69; Hemoglobin 13.6; Platelets 286; Potassium 5.2; Sodium 136   Recent Lipid Panel No results found for: CHOL, TRIG, HDL, CHOLHDL, LDLCALC, LDLDIRECT  Physical Exam:    VS:  BP 120/60   Pulse 61   Ht 5' 9"  (1.753 m)   Wt 130 lb 6.4 oz (59.1 kg)   SpO2 97%   BMI 19.26 kg/m     Wt Readings from Last 3 Encounters:  06/14/16 130 lb 6.4 oz (59.1 kg)  06/12/16 140 lb (63.5 kg)  06/06/16 140 lb (63.5 kg)     Physical Exam  Constitutional: He is oriented to person, place, and time. He appears well-developed and well-nourished. No distress.  HENT:  Head: Normocephalic and atraumatic.  Eyes: No scleral icterus.  Neck: Normal range of motion. No JVD present.  Cardiovascular: Normal rate, regular rhythm, S1 normal and S2 normal.   Murmur heard.  Harsh systolic murmur is present with a grade of 2/6  at the upper left sternal border Pulmonary/Chest: Effort normal and breath sounds normal. He has no wheezes. He has no rhonchi. He has no rales.  Abdominal: Soft. There is no tenderness.  Musculoskeletal: He exhibits no edema or deformity (R wrist without hematoma).  Neurological: He is alert and oriented to person, place, and time.  Skin: Skin is warm and dry.  Psychiatric: He has a normal mood and affect.    ASSESSMENT:    1. Coronary artery disease involving native coronary artery of native heart without angina pectoris   2. Nonrheumatic aortic valve stenosis   3. Bladder mass   4. Benign essential HTN   5. Hyperlipidemia, unspecified hyperlipidemia type    PLAN:    In order of problems listed above:  1. Coronary artery disease involving native coronary artery of native heart without angina pectoris -  S/p DES to [facility].  He denies angina.  We discussed the importance of dual antiplatelet Rx.  Continue ASA, Clopidogrel, statin.  2. Nonrheumatic aortic valve stenosis - He has severe  aortic stenosis and is pending TAVR 07/02/16 with Dr. Burt Knack.    3. Bladder mass - He had a CT recently with incidental finding of chronic bladder outlet obstruction with thickened and heavily trabeculated wall.  He sees urology already for BPH.  I have asked him to call for follow up.  I will also send my note to his urologist.    4. Benign essential HTN - The patient's blood pressure is controlled on his current regimen.  Continue current therapy.  5. Hyperlipidemia, unspecified hyperlipidemia type - Managed by PCP.  LDL optimal on most recent lab work.  Continue current Rx.    Dispo:  Return to [facility] 07/02/16 for planned TAVR.   Medication Adjustments/Labs and Tests Ordered: Current medicines are reviewed at length with the patient today.  Concerns regarding medicines are outlined above.  Orders/Tests:  Orders Placed This Encounter  Procedures  . EKG 12-Lead   Medication changes: No orders of the defined types were placed in this encounter.  Signed, Richardson Dopp, PA-C  06/14/2016 11:50 AM    [facility] [place], [place],   [address] Phone: [phone]; Fax: [phone]

## 2016-06-14 NOTE — Patient Instructions (Signed)
Medication Instructions:  Your physician recommends that you continue on your current medications as directed. Please refer to the Current Medication list given to you today.   Labwork: NONE ORDERED  Testing/Procedures: NONE ORDERED  Follow-Up: YOU ARE SCHEDULED FOR YOUR PROCEDURE WITH DR. Burt Knack ON 07/02/16  Any Other Special Instructions Will Be Listed Below (If Applicable). PER SCOTT WEAVER, PAC TO CALL YOUR UROLOGIST    If you need a refill on your cardiac medications before your next appointment, please call your pharmacy.

## 2016-06-24 ENCOUNTER — Telehealth: Payer: Self-pay | Admitting: Cardiovascular Disease

## 2016-06-24 NOTE — Telephone Encounter (Signed)
New message:    Please call,have some questions about  His TAVR that is coming up.

## 2016-06-24 NOTE — Telephone Encounter (Signed)
Pt spoke with Levonne Spiller RN and wanted to know how soon after TAVR he can proceed with hip surgery. Per Dr Burt Knack the pt would need to be at least 6 months out from PCI/TAVR before a determination can be made in regards to surgical clearance. Ryan made the pt aware of Dr Cooper's response.

## 2016-06-28 ENCOUNTER — Encounter (HOSPITAL_COMMUNITY)
Admission: RE | Admit: 2016-06-28 | Discharge: 2016-06-28 | Disposition: A | Discharge status: Home / Self Care | Payer: BC Managed Care – PPO | Source: Ambulatory Visit | Attending: Cardiovascular Disease | Admitting: Cardiovascular Disease

## 2016-06-28 ENCOUNTER — Encounter (HOSPITAL_COMMUNITY): Payer: Self-pay

## 2016-06-28 DIAGNOSIS — Z01818 Encounter for other preprocedural examination: Secondary | ICD-10-CM | POA: Insufficient documentation

## 2016-06-28 DIAGNOSIS — I350 Nonrheumatic aortic (valve) stenosis: Secondary | ICD-10-CM | POA: Diagnosis not present

## 2016-06-28 DIAGNOSIS — I517 Cardiomegaly: Secondary | ICD-10-CM | POA: Insufficient documentation

## 2016-06-28 LAB — CBC
HEMATOCRIT: 45.3 % (ref 39.0–52.0)
HEMOGLOBIN: 14.4 g/dL (ref 13.0–17.0)
MCH: 30.3 pg (ref 26.0–34.0)
MCHC: 31.8 g/dL (ref 30.0–36.0)
MCV: 95.4 fL (ref 78.0–100.0)
Platelets: 265 10*3/uL (ref 150–400)
RBC: 4.75 MIL/uL (ref 4.22–5.81)
RDW: 13.2 % (ref 11.5–15.5)
WBC: 8.4 10*3/uL (ref 4.0–10.5)

## 2016-06-28 LAB — COMPREHENSIVE METABOLIC PANEL
ALBUMIN: 4 g/dL (ref 3.5–5.0)
ALK PHOS: 72 U/L (ref 38–126)
ALT: 12 U/L — AB (ref 17–63)
ANION GAP: 8 (ref 5–15)
AST: 20 U/L (ref 15–41)
BILIRUBIN TOTAL: 0.7 mg/dL (ref 0.3–1.2)
BUN: 14 mg/dL (ref 6–20)
CALCIUM: 9.5 mg/dL (ref 8.9–10.3)
CO2: 29 mmol/L (ref 22–32)
CREATININE: 0.71 mg/dL (ref 0.61–1.24)
Chloride: 97 mmol/L — ABNORMAL LOW (ref 101–111)
GFR calc non Af Amer: >60 mL/min (ref >60)
GLUCOSE: 103 mg/dL — AB (ref 65–99)
Potassium: 4.7 mmol/L (ref 3.5–5.1)
Sodium: 134 mmol/L — ABNORMAL LOW (ref 135–145)
TOTAL PROTEIN: 7.3 g/dL (ref 6.5–8.1)

## 2016-06-28 LAB — TYPE AND SCREEN
ABO/RH(D): A POS
ANTIBODY SCREEN: NEGATIVE

## 2016-06-28 LAB — BLOOD GAS, ARTERIAL
Acid-Base Excess: 2.8 mmol/L — ABNORMAL HIGH (ref 0.0–2.0)
BICARBONATE: 27 mmol/L (ref 20.0–28.0)
Drawn by: 449841
FIO2: 21
O2 Saturation: 97.1 %
PH ART: 7.414 (ref 7.350–7.450)
PO2 ART: 90.3 mmHg (ref 83.0–108.0)
Patient temperature: 98.6
pCO2 arterial: 43 mmHg (ref 32.0–48.0)

## 2016-06-28 LAB — APTT: aPTT: 38 seconds — ABNORMAL HIGH (ref 24–36)

## 2016-06-28 LAB — ABO/RH: ABO/RH(D): A POS

## 2016-06-28 LAB — URINALYSIS, ROUTINE W REFLEX MICROSCOPIC
BILIRUBIN URINE: NEGATIVE
GLUCOSE, UA: NEGATIVE mg/dL
HGB URINE DIPSTICK: NEGATIVE
Ketones, ur: NEGATIVE mg/dL
Leukocytes, UA: NEGATIVE
Nitrite: NEGATIVE
Protein, ur: NEGATIVE mg/dL
SPECIFIC GRAVITY, URINE: 1.014 (ref 1.005–1.030)
pH: 5 (ref 5.0–8.0)

## 2016-06-28 LAB — SURGICAL PCR SCREEN
MRSA, PCR: NEGATIVE
Staphylococcus aureus: NEGATIVE

## 2016-06-28 LAB — PROTIME-INR
INR: 1.06
Prothrombin Time: 13.8 seconds (ref 11.4–15.2)

## 2016-06-28 NOTE — Progress Notes (Signed)
Pt. States that he was told to continue ASA and plavix

## 2016-06-28 NOTE — Pre-Procedure Instructions (Signed)
[name] [name]  06/28/2016      [place], [place] [address] Phone: [phone] Fax: [phone]    Your procedure is scheduled on 07-02-2016 Monday .  Report to [facility] Admitting at 8:00 A.M.  Call this number if you have problems the morning of surgery:  [phone]   Remember:  Do not eat food or drink liquids after midnight.   Take these medicines the morning of surgery with A SIP OF WATER Tylenol if needed,Xalatan eye drops,timolol(Betimol)eye drops     STOP ASPIRIN,ANTIINFLAMATORIES (IBUPROFEN,ALEVE,MOTRIN,ADVIL,GOODY'S POWDERS),HERBAL SUPPLEMENTS,FISH OIL,AND VITAMINS 5-7 DAYS PRIOR TO SURGEY     Do not wear jewelry, make-up or nail polish.  Do not wear lotions, powders, or perfumes, or deoderant.  Do not shave 48 hours prior to surgery.  Men may shave face and neck.  Do not bring valuables to the hospital.  [facility] is not responsible for any belongings or valuables.  Contacts, dentures or bridgework may not be worn into surgery.  Leave your suitcase in the car.  After surgery it may be brought to your room.  For patients admitted to the hospital, discharge time will be determined by your treatment team.  Patients discharged the day of surgery will not be allowed to drive home    Special Instructions: [facility] - Preparing for Surgery  Before surgery, you can play an important role.  Because skin is not sterile, your skin needs to be as free of germs as possible.  You can reduce the number of germs on you skin by washing with CHG (chlorahexidine gluconate) soap before surgery.  CHG is an antiseptic cleaner which kills germs and bonds with the skin to continue killing germs even after washing.  Please DO NOT use if you have an allergy to CHG or antibacterial soaps.  If your skin becomes reddened/irritated stop using the CHG and inform your nurse when  you arrive at Short Stay.  Do not shave (including legs and underarms) for at least 48 hours prior to the first CHG shower.  You may shave your face.  Please follow these instructions carefully:   1.  Shower with CHG Soap the night before surgery and the   morning of Surgery.  2.  If you choose to wash your hair, wash your hair first as usual with your normal shampoo.  3.  After you shampoo, rinse your hair and body thoroughly to remove the  Shampoo.  4.  Use CHG as you would any other liquid soap.  You can apply chg directly  to the skin and wash gently with scrungie or a clean washcloth.  5.  Apply the CHG Soap to your body ONLY FROM THE NECK DOWN.   Do not use on open wounds or open sores.  Avoid contact with your eyes,  ears, mouth and genitals (private parts).  Wash genitals (private parts) with your normal soap.  6.  Wash thoroughly, paying special attention to the area where your surgery will be performed.  7.  Thoroughly rinse your body with warm water from the neck down.  8.  DO NOT shower/wash with your normal soap after using and rinsing o  the CHG Soap.  9.  Pat yourself dry with a clean towel.            10.  Wear clean pajamas.  11.  Place clean sheets on your bed the night of your first shower and do not sleep with pets.  Day of Surgery  Do not apply any lotions/deodorants the morning of surgery.  Please wear clean clothes to the hospital/surgery center.      Please read over the following fact sheets that you were given. Coughing and Deep Breathing, MRSA Information and Surgical Site Infection Prevention

## 2016-06-29 LAB — HEMOGLOBIN A1C
HEMOGLOBIN A1C: 5.5 % (ref 4.8–5.6)
MEAN PLASMA GLUCOSE: 111 mg/dL

## 2016-07-01 MED ORDER — EPINEPHRINE PF 1 MG/ML IJ SOLN
0.0000 ug/min | INTRAVENOUS | Status: DC
  Filled 2016-07-01: qty 4

## 2016-07-01 MED ORDER — DOPAMINE-DEXTROSE 3.2-5 MG/ML-% IV SOLN
0.0000 ug/kg/min | INTRAVENOUS | Status: DC
  Filled 2016-07-01: qty 250

## 2016-07-01 MED ORDER — POTASSIUM CHLORIDE 2 MEQ/ML IV SOLN
80.0000 meq | INTRAVENOUS | Status: DC
  Filled 2016-07-01: qty 40

## 2016-07-01 MED ORDER — HEPARIN SODIUM (PORCINE) 1000 UNIT/ML IJ SOLN
INTRAMUSCULAR | Status: DC
  Filled 2016-07-01: qty 30

## 2016-07-01 MED ORDER — SODIUM CHLORIDE 0.9 % IV SOLN
INTRAVENOUS | Status: DC
  Filled 2016-07-01: qty 1

## 2016-07-01 MED ORDER — MAGNESIUM SULFATE 50 % IJ SOLN
40.0000 meq | INTRAMUSCULAR | Status: DC
Start: 2016-07-02 — End: 2016-07-02
  Filled 2016-07-01: qty 10

## 2016-07-01 MED ORDER — VANCOMYCIN HCL 10 G IV SOLR
1250.0000 mg | INTRAVENOUS | Status: AC
  Administered 2016-07-02: 1250 mg via INTRAVENOUS
  Filled 2016-07-01: qty 1250

## 2016-07-01 MED ORDER — DEXMEDETOMIDINE HCL IN NACL 400 MCG/100ML IV SOLN
0.1000 ug/kg/h | INTRAVENOUS | Status: AC
  Administered 2016-07-02: 1.5 ug/kg/h via INTRAVENOUS
  Filled 2016-07-01: qty 100

## 2016-07-01 MED ORDER — NOREPINEPHRINE BITARTRATE 1 MG/ML IV SOLN
0.0000 ug/min | INTRAVENOUS | Status: AC
  Administered 2016-07-02: 2 ug/min via INTRAVENOUS
  Filled 2016-07-01: qty 4

## 2016-07-01 MED ORDER — DEXTROSE 5 % IV SOLN
1.5000 g | INTRAVENOUS | Status: AC
  Administered 2016-07-02: 1.5 g via INTRAVENOUS
  Filled 2016-07-01: qty 1.5

## 2016-07-01 MED ORDER — NITROGLYCERIN IN D5W 200-5 MCG/ML-% IV SOLN
2.0000 ug/min | INTRAVENOUS | Status: DC
  Filled 2016-07-01: qty 250

## 2016-07-01 MED ORDER — SODIUM CHLORIDE 0.9 % IV SOLN
30.0000 ug/min | INTRAVENOUS | Status: DC
  Filled 2016-07-01: qty 2

## 2016-07-01 NOTE — Anesthesia Preprocedure Evaluation (Addendum)
Anesthesia Evaluation  Patient identified by MRN, date of birth, ID band Patient awake    Reviewed: Allergy & Precautions, H&P , NPO status , Patient's Chart, lab work & pertinent test results  Airway Mallampati: II  TM Distance: >3 FB Neck ROM: Full    Dental no notable dental hx. (+) Teeth Intact, Dental Advisory Given   Pulmonary neg pulmonary ROS, former smoker,    Pulmonary exam normal breath sounds clear to auscultation       Cardiovascular Exercise Tolerance: Good hypertension, Pt. on medications + CAD, + Cardiac Stents and + Peripheral Vascular Disease  + Valvular Problems/Murmurs AS  Rhythm:Regular Rate:Normal + Systolic murmurs    Neuro/Psych negative neurological ROS  negative psych ROS   GI/Hepatic negative GI ROS, Neg liver ROS,   Endo/Other  negative endocrine ROS  Renal/GU negative Renal ROS  negative genitourinary   Musculoskeletal  (+) Arthritis , Osteoarthritis,    Abdominal   Peds  Hematology negative hematology ROS (+)   Anesthesia Other Findings   Reproductive/Obstetrics negative OB ROS                            Anesthesia Physical Anesthesia Plan  ASA: IV  Anesthesia Plan: MAC   Post-op Pain Management:    Induction: Intravenous  PONV Risk Score and Plan: 1 and Ondansetron, Propofol and Midazolam  Airway Management Planned: Nasal Cannula  Additional Equipment: Arterial line, CVP and Ultrasound Guidance Line Placement  Intra-op Plan:   Post-operative Plan:   Informed Consent: I have reviewed the patients History and Physical, chart, labs and discussed the procedure including the risks, benefits and alternatives for the proposed anesthesia with the patient or authorized representative who has indicated his/her understanding and acceptance.   Dental advisory given  Plan Discussed with: CRNA  Anesthesia Plan Comments:        Anesthesia Quick  Evaluation

## 2016-07-02 ENCOUNTER — Inpatient Hospital Stay (HOSPITAL_COMMUNITY)
Admission: RE | Admit: 2016-07-02 | Discharge: 2016-07-04 | DRG: 267 | Disposition: A | Discharge status: Home / Self Care | Payer: BC Managed Care – PPO | Source: Ambulatory Visit | Attending: Thoracic Surgery (Cardiothoracic Vascular Surgery) | Admitting: Thoracic Surgery (Cardiothoracic Vascular Surgery)

## 2016-07-02 ENCOUNTER — Ambulatory Visit (HOSPITAL_COMMUNITY): Payer: BC Managed Care – PPO

## 2016-07-02 ENCOUNTER — Encounter (HOSPITAL_COMMUNITY): Payer: Self-pay | Admitting: *Deleted

## 2016-07-02 ENCOUNTER — Inpatient Hospital Stay (HOSPITAL_COMMUNITY): Payer: BC Managed Care – PPO | Admitting: Anesthesiology

## 2016-07-02 ENCOUNTER — Encounter (HOSPITAL_COMMUNITY)
Admission: RE | Disposition: A | Discharge status: Home / Self Care | Payer: Self-pay | Source: Ambulatory Visit | Attending: Thoracic Surgery (Cardiothoracic Vascular Surgery)

## 2016-07-02 ENCOUNTER — Inpatient Hospital Stay (HOSPITAL_COMMUNITY): Payer: BC Managed Care – PPO

## 2016-07-02 DIAGNOSIS — R0602 Shortness of breath: Secondary | ICD-10-CM | POA: Diagnosis present

## 2016-07-02 DIAGNOSIS — I360 Nonrheumatic tricuspid (valve) stenosis: Secondary | ICD-10-CM | POA: Diagnosis not present

## 2016-07-02 DIAGNOSIS — I350 Nonrheumatic aortic (valve) stenosis: Secondary | ICD-10-CM

## 2016-07-02 DIAGNOSIS — Z7902 Long term (current) use of antithrombotics/antiplatelets: Secondary | ICD-10-CM

## 2016-07-02 DIAGNOSIS — I341 Nonrheumatic mitral (valve) prolapse: Secondary | ICD-10-CM | POA: Diagnosis present

## 2016-07-02 DIAGNOSIS — Q231 Congenital insufficiency of aortic valve: Secondary | ICD-10-CM

## 2016-07-02 DIAGNOSIS — F0390 Unspecified dementia without behavioral disturbance: Secondary | ICD-10-CM | POA: Diagnosis present

## 2016-07-02 DIAGNOSIS — Z006 Encounter for examination for normal comparison and control in clinical research program: Secondary | ICD-10-CM

## 2016-07-02 DIAGNOSIS — I080 Rheumatic disorders of both mitral and aortic valves: Secondary | ICD-10-CM | POA: Diagnosis present

## 2016-07-02 DIAGNOSIS — I10 Essential (primary) hypertension: Secondary | ICD-10-CM | POA: Diagnosis present

## 2016-07-02 DIAGNOSIS — M1712 Unilateral primary osteoarthritis, left knee: Secondary | ICD-10-CM | POA: Diagnosis present

## 2016-07-02 DIAGNOSIS — I2510 Atherosclerotic heart disease of native coronary artery without angina pectoris: Secondary | ICD-10-CM | POA: Diagnosis present

## 2016-07-02 DIAGNOSIS — E785 Hyperlipidemia, unspecified: Secondary | ICD-10-CM | POA: Diagnosis present

## 2016-07-02 DIAGNOSIS — N320 Bladder-neck obstruction: Secondary | ICD-10-CM | POA: Diagnosis present

## 2016-07-02 DIAGNOSIS — Z87891 Personal history of nicotine dependence: Secondary | ICD-10-CM | POA: Diagnosis not present

## 2016-07-02 DIAGNOSIS — Q2381 Bicuspid aortic valve: Secondary | ICD-10-CM

## 2016-07-02 DIAGNOSIS — M1612 Unilateral primary osteoarthritis, left hip: Secondary | ICD-10-CM | POA: Diagnosis present

## 2016-07-02 DIAGNOSIS — Z954 Presence of other heart-valve replacement: Secondary | ICD-10-CM | POA: Diagnosis not present

## 2016-07-02 DIAGNOSIS — N400 Enlarged prostate without lower urinary tract symptoms: Secondary | ICD-10-CM | POA: Diagnosis present

## 2016-07-02 DIAGNOSIS — J9811 Atelectasis: Secondary | ICD-10-CM

## 2016-07-02 DIAGNOSIS — Z7951 Long term (current) use of inhaled steroids: Secondary | ICD-10-CM | POA: Diagnosis not present

## 2016-07-02 DIAGNOSIS — Z955 Presence of coronary angioplasty implant and graft: Secondary | ICD-10-CM

## 2016-07-02 DIAGNOSIS — Z952 Presence of prosthetic heart valve: Secondary | ICD-10-CM | POA: Diagnosis not present

## 2016-07-02 DIAGNOSIS — Z7982 Long term (current) use of aspirin: Secondary | ICD-10-CM | POA: Diagnosis not present

## 2016-07-02 DIAGNOSIS — I739 Peripheral vascular disease, unspecified: Secondary | ICD-10-CM | POA: Diagnosis present

## 2016-07-02 DIAGNOSIS — Z8249 Family history of ischemic heart disease and other diseases of the circulatory system: Secondary | ICD-10-CM | POA: Diagnosis not present

## 2016-07-02 DIAGNOSIS — H409 Unspecified glaucoma: Secondary | ICD-10-CM | POA: Diagnosis present

## 2016-07-02 HISTORY — PX: TRANSCATHETER AORTIC VALVE REPLACEMENT, TRANSFEMORAL: SHX6400

## 2016-07-02 HISTORY — PX: TEE WITHOUT CARDIOVERSION: SHX5443

## 2016-07-02 LAB — POCT I-STAT 3, ART BLOOD GAS (G3+)
Acid-Base Excess: 4 mmol/L — ABNORMAL HIGH (ref 0.0–2.0)
Bicarbonate: 29.8 mmol/L — ABNORMAL HIGH (ref 20.0–28.0)
O2 Saturation: 100 %
Patient temperature: 96.5
TCO2: 31 mmol/L (ref 0–100)
pCO2 arterial: 45.2 mmHg (ref 32.0–48.0)
pH, Arterial: 7.422 (ref 7.350–7.450)
pO2, Arterial: 217 mmHg — ABNORMAL HIGH (ref 83.0–108.0)

## 2016-07-02 LAB — POCT I-STAT, CHEM 8
BUN: 12 mg/dL (ref 6–20)
BUN: 12 mg/dL (ref 6–20)
BUN: 11 mg/dL (ref 6–20)
CHLORIDE: 97 mmol/L — AB (ref 101–111)
Calcium, Ion: 1.26 mmol/L (ref 1.15–1.40)
Calcium, Ion: 1.22 mmol/L (ref 1.15–1.40)
Calcium, Ion: 1.26 mmol/L (ref 1.15–1.40)
Chloride: 98 mmol/L — ABNORMAL LOW (ref 101–111)
Chloride: 98 mmol/L — ABNORMAL LOW (ref 101–111)
Creatinine, Ser: 0.4 mg/dL — ABNORMAL LOW (ref 0.61–1.24)
Creatinine, Ser: 0.5 mg/dL — ABNORMAL LOW (ref 0.61–1.24)
Creatinine, Ser: 0.4 mg/dL — ABNORMAL LOW (ref 0.61–1.24)
Glucose, Bld: 106 mg/dL — ABNORMAL HIGH (ref 65–99)
Glucose, Bld: 106 mg/dL — ABNORMAL HIGH (ref 65–99)
Glucose, Bld: 113 mg/dL — ABNORMAL HIGH (ref 65–99)
HCT: 36 % — ABNORMAL LOW (ref 39.0–52.0)
HEMATOCRIT: 35 % — AB (ref 39.0–52.0)
HEMATOCRIT: 35 % — AB (ref 39.0–52.0)
HEMOGLOBIN: 11.9 g/dL — AB (ref 13.0–17.0)
Hemoglobin: 12.2 g/dL — ABNORMAL LOW (ref 13.0–17.0)
Hemoglobin: 11.9 g/dL — ABNORMAL LOW (ref 13.0–17.0)
POTASSIUM: 4.2 mmol/L (ref 3.5–5.1)
Potassium: 4.1 mmol/L (ref 3.5–5.1)
Potassium: 4.2 mmol/L (ref 3.5–5.1)
SODIUM: 137 mmol/L (ref 135–145)
SODIUM: 138 mmol/L (ref 135–145)
SODIUM: 138 mmol/L (ref 135–145)
TCO2: 28 mmol/L (ref 0–100)
TCO2: 30 mmol/L (ref 0–100)
TCO2: 30 mmol/L (ref 0–100)

## 2016-07-02 LAB — CBC
HCT: 38.3 % — ABNORMAL LOW (ref 39.0–52.0)
HEMOGLOBIN: 12.3 g/dL — AB (ref 13.0–17.0)
MCH: 29.8 pg (ref 26.0–34.0)
MCHC: 32.1 g/dL (ref 30.0–36.0)
MCV: 92.7 fL (ref 78.0–100.0)
Platelets: 190 10*3/uL (ref 150–400)
RBC: 4.13 MIL/uL — AB (ref 4.22–5.81)
RDW: 12.8 % (ref 11.5–15.5)
WBC: 6.7 10*3/uL (ref 4.0–10.5)

## 2016-07-02 LAB — PROTIME-INR
INR: 1.23
PROTHROMBIN TIME: 15.6 s — AB (ref 11.4–15.2)

## 2016-07-02 LAB — APTT: APTT: 38 s — AB (ref 24–36)

## 2016-07-02 SURGERY — IMPLANTATION, AORTIC VALVE, TRANSCATHETER, FEMORAL APPROACH
Anesthesia: Monitor Anesthesia Care | Site: Chest

## 2016-07-02 MED ORDER — SODIUM CHLORIDE 0.9 % IV SOLN: 1.0000 mL/kg/h | INTRAVENOUS | Status: DC

## 2016-07-02 MED ORDER — CHLORHEXIDINE GLUCONATE 4 % EX LIQD: 1.0000 "application " | Freq: Once | CUTANEOUS | Status: DC

## 2016-07-02 MED ORDER — PANTOPRAZOLE SODIUM 40 MG PO TBEC
40.0000 mg | DELAYED_RELEASE_TABLET | Freq: Every day | ORAL | Status: DC
  Administered 2016-07-04: 40 mg via ORAL
  Filled 2016-07-02: qty 1

## 2016-07-02 MED ORDER — LATANOPROST 0.005 % OP SOLN
1.0000 [drp] | Freq: Every day | OPHTHALMIC | Status: DC
  Administered 2016-07-02 – 2016-07-03 (×2): 1 [drp] via OPHTHALMIC
  Filled 2016-07-02: qty 2.5

## 2016-07-02 MED ORDER — CHLORHEXIDINE GLUCONATE 0.12 % MT SOLN
15.0000 mL | Freq: Once | OROMUCOSAL | Status: AC
  Administered 2016-07-02: 15 mL via OROMUCOSAL
  Filled 2016-07-02: qty 15

## 2016-07-02 MED ORDER — SODIUM CHLORIDE 0.9 % IV SOLN
0.0000 ug/min | INTRAVENOUS | Status: DC
  Filled 2016-07-02: qty 2

## 2016-07-02 MED ORDER — LIDOCAINE HCL (PF) 1 % IJ SOLN
INTRAMUSCULAR | Status: DC | PRN
  Administered 2016-07-02: 2 mL

## 2016-07-02 MED ORDER — POTASSIUM CHLORIDE 10 MEQ/50ML IV SOLN: 10.0000 meq | INTRAVENOUS | Status: AC

## 2016-07-02 MED ORDER — IODIXANOL 320 MG/ML IV SOLN
INTRAVENOUS | Status: DC | PRN
  Administered 2016-07-02: 58.4 mL via INTRAVENOUS

## 2016-07-02 MED ORDER — FAMOTIDINE IN NACL 20-0.9 MG/50ML-% IV SOLN
20.0000 mg | Freq: Two times a day (BID) | INTRAVENOUS | Status: DC
  Filled 2016-07-02: qty 50

## 2016-07-02 MED ORDER — CLOPIDOGREL BISULFATE 75 MG PO TABS
75.0000 mg | ORAL_TABLET | Freq: Every day | ORAL | Status: DC
  Administered 2016-07-03 – 2016-07-04 (×2): 75 mg via ORAL
  Filled 2016-07-02 (×2): qty 1

## 2016-07-02 MED ORDER — ASPIRIN EC 81 MG PO TBEC
81.0000 mg | DELAYED_RELEASE_TABLET | Freq: Every day | ORAL | Status: DC
  Administered 2016-07-03 – 2016-07-04 (×2): 81 mg via ORAL
  Filled 2016-07-02 (×2): qty 1

## 2016-07-02 MED ORDER — METOPROLOL TARTRATE 5 MG/5ML IV SOLN: 2.5000 mg | INTRAVENOUS | Status: DC | PRN

## 2016-07-02 MED ORDER — MIDAZOLAM HCL 2 MG/2ML IJ SOLN
INTRAMUSCULAR | Status: AC
Start: 2016-07-02 — End: 2016-07-02
  Filled 2016-07-02: qty 2

## 2016-07-02 MED ORDER — PHENYLEPHRINE 40 MCG/ML (10ML) SYRINGE FOR IV PUSH (FOR BLOOD PRESSURE SUPPORT)
PREFILLED_SYRINGE | INTRAVENOUS | Status: AC
  Filled 2016-07-02: qty 30

## 2016-07-02 MED ORDER — LIDOCAINE HCL (PF) 1 % IJ SOLN
INTRAMUSCULAR | Status: AC
  Filled 2016-07-02: qty 30

## 2016-07-02 MED ORDER — LACTATED RINGERS IV SOLN: 500.0000 mL | Freq: Once | INTRAVENOUS | Status: DC | PRN

## 2016-07-02 MED ORDER — ONDANSETRON HCL 4 MG/2ML IJ SOLN: 4.0000 mg | Freq: Four times a day (QID) | INTRAMUSCULAR | Status: DC | PRN

## 2016-07-02 MED ORDER — LIDOCAINE 2% (20 MG/ML) 5 ML SYRINGE
INTRAMUSCULAR | Status: DC | PRN
  Administered 2016-07-02: 100 mg via INTRAVENOUS

## 2016-07-02 MED ORDER — PHENYLEPHRINE HCL 10 MG/ML IJ SOLN
INTRAMUSCULAR | Status: DC | PRN
  Administered 2016-07-02: 40 ug via INTRAVENOUS

## 2016-07-02 MED ORDER — TRAMADOL HCL 50 MG PO TABS: 50.0000 mg | ORAL_TABLET | ORAL | Status: DC | PRN

## 2016-07-02 MED ORDER — MORPHINE SULFATE (PF) 2 MG/ML IV SOLN: 1.0000 mg | INTRAVENOUS | Status: DC | PRN

## 2016-07-02 MED ORDER — METOPROLOL TARTRATE 12.5 MG HALF TABLET
12.5000 mg | ORAL_TABLET | Freq: Two times a day (BID) | ORAL | Status: DC
  Administered 2016-07-03 – 2016-07-04 (×3): 12.5 mg via ORAL
  Filled 2016-07-02 (×3): qty 1

## 2016-07-02 MED ORDER — MIDAZOLAM HCL 2 MG/2ML IJ SOLN: 2.0000 mg | INTRAMUSCULAR | Status: DC | PRN

## 2016-07-02 MED ORDER — NITROGLYCERIN IN D5W 200-5 MCG/ML-% IV SOLN: 0.0000 ug/min | INTRAVENOUS | Status: DC

## 2016-07-02 MED ORDER — FENTANYL CITRATE (PF) 100 MCG/2ML IJ SOLN
INTRAMUSCULAR | Status: AC
  Filled 2016-07-02: qty 2

## 2016-07-02 MED ORDER — TIMOLOL HEMIHYDRATE 0.5 % OP SOLN
1.0000 [drp] | Freq: Two times a day (BID) | OPHTHALMIC | Status: DC
  Filled 2016-07-02: qty 5

## 2016-07-02 MED ORDER — PRAVASTATIN SODIUM 40 MG PO TABS
40.0000 mg | ORAL_TABLET | Freq: Every evening | ORAL | Status: DC
  Administered 2016-07-03: 40 mg via ORAL
  Filled 2016-07-02: qty 1

## 2016-07-02 MED ORDER — ALBUMIN HUMAN 5 % IV SOLN
250.0000 mL | INTRAVENOUS | Status: AC | PRN
  Filled 2016-07-02: qty 250

## 2016-07-02 MED ORDER — DEXTROSE 5 % IV SOLN
1.5000 g | Freq: Two times a day (BID) | INTRAVENOUS | Status: AC
  Administered 2016-07-02 – 2016-07-04 (×4): 1.5 g via INTRAVENOUS
  Filled 2016-07-02 (×4): qty 1.5

## 2016-07-02 MED ORDER — SODIUM CHLORIDE 0.9 % IV SOLN: INTRAVENOUS | Status: DC

## 2016-07-02 MED ORDER — LACTATED RINGERS IV SOLN
INTRAVENOUS | Status: DC
  Administered 2016-07-02: 12:00:00 via INTRAVENOUS

## 2016-07-02 MED ORDER — FENTANYL CITRATE (PF) 250 MCG/5ML IJ SOLN
INTRAMUSCULAR | Status: AC
  Filled 2016-07-02: qty 5

## 2016-07-02 MED ORDER — MIDAZOLAM HCL 2 MG/2ML IJ SOLN
INTRAMUSCULAR | Status: AC
  Filled 2016-07-02: qty 2

## 2016-07-02 MED ORDER — MIDAZOLAM HCL 2 MG/2ML IJ SOLN
2.0000 mg | Freq: Once | INTRAMUSCULAR | Status: DC
  Filled 2016-07-02: qty 2

## 2016-07-02 MED ORDER — VANCOMYCIN HCL IN DEXTROSE 1-5 GM/200ML-% IV SOLN
1000.0000 mg | Freq: Once | INTRAVENOUS | Status: AC
  Administered 2016-07-02: 1000 mg via INTRAVENOUS
  Filled 2016-07-02: qty 200

## 2016-07-02 MED ORDER — PROPOFOL 500 MG/50ML IV EMUL
INTRAVENOUS | Status: DC | PRN
  Administered 2016-07-02: 20 ug/kg/min via INTRAVENOUS

## 2016-07-02 MED ORDER — PROTAMINE SULFATE 10 MG/ML IV SOLN
INTRAVENOUS | Status: DC | PRN
  Administered 2016-07-02 (×2): 20 mg via INTRAVENOUS
  Administered 2016-07-02: 10 mg via INTRAVENOUS
  Administered 2016-07-02: 20 mg via INTRAVENOUS
  Administered 2016-07-02: 10 mg via INTRAVENOUS

## 2016-07-02 MED ORDER — SODIUM CHLORIDE 0.9 % IV SOLN
INTRAVENOUS | Status: DC | PRN
  Administered 2016-07-02 (×3): 500 mL

## 2016-07-02 MED ORDER — CHLORHEXIDINE GLUCONATE 4 % EX LIQD: 30.0000 mL | CUTANEOUS | Status: DC

## 2016-07-02 MED ORDER — 0.9 % SODIUM CHLORIDE (POUR BTL) OPTIME
TOPICAL | Status: DC | PRN
  Administered 2016-07-02: 1000 mL

## 2016-07-02 MED ORDER — METOPROLOL TARTRATE 25 MG/10 ML ORAL SUSPENSION: 12.5000 mg | Freq: Two times a day (BID) | ORAL | Status: DC

## 2016-07-02 MED ORDER — CHLORHEXIDINE GLUCONATE 0.12 % MT SOLN
15.0000 mL | OROMUCOSAL | Status: AC
  Administered 2016-07-02: 15 mL via OROMUCOSAL

## 2016-07-02 MED ORDER — FENTANYL CITRATE (PF) 100 MCG/2ML IJ SOLN
100.0000 ug | Freq: Once | INTRAMUSCULAR | Status: AC
  Administered 2016-07-02: 50 ug via INTRAVENOUS
  Filled 2016-07-02: qty 2

## 2016-07-02 MED ORDER — MORPHINE SULFATE (PF) 4 MG/ML IV SOLN: 1.0000 mg | INTRAVENOUS | Status: DC | PRN

## 2016-07-02 MED ORDER — OXYCODONE HCL 5 MG PO TABS: 5.0000 mg | ORAL_TABLET | ORAL | Status: DC | PRN

## 2016-07-02 MED ORDER — HEPARIN SODIUM (PORCINE) 1000 UNIT/ML IJ SOLN
INTRAMUSCULAR | Status: DC | PRN
  Administered 2016-07-02: 8000 [IU] via INTRAVENOUS

## 2016-07-02 MED ORDER — MIDAZOLAM HCL 2 MG/2ML IJ SOLN
INTRAMUSCULAR | Status: DC | PRN
  Administered 2016-07-02: 1 mg via INTRAVENOUS

## 2016-07-02 SURGICAL SUPPLY — 103 items
ADAPTER UNIV SWAN GANZ BIP (ADAPTER) ×2 IMPLANT
ADAPTER UNV SWAN GANZ BIP (ADAPTER) ×2
ATTRACTOMAT 16X20 MAGNETIC DRP (DRAPES) IMPLANT
BAG BANDED W/RUBBER/TAPE 36X54 (MISCELLANEOUS) ×4 IMPLANT
BAG DECANTER FOR FLEXI CONT (MISCELLANEOUS) IMPLANT
BAG SNAP BAND KOVER 36X36 (MISCELLANEOUS) ×8 IMPLANT
BLADE 10 SAFETY STRL DISP (BLADE) ×4 IMPLANT
BLADE CLIPPER SURG (BLADE) IMPLANT
BLADE STERNUM SYSTEM 6 (BLADE) ×8 IMPLANT
CABLE ADAPT CONN TEMP 6FT (ADAPTER) ×4 IMPLANT
CABLE PACING FASLOC BIEGE (MISCELLANEOUS) ×4 IMPLANT
CABLE PACING FASLOC BLUE (MISCELLANEOUS) ×4 IMPLANT
CANISTER SUCT 3000ML PPV (MISCELLANEOUS) IMPLANT
CANNULA FEM VENOUS REMOTE 22FR (CANNULA) IMPLANT
CANNULA OPTISITE PERFUSION 16F (CANNULA) IMPLANT
CANNULA OPTISITE PERFUSION 18F (CANNULA) IMPLANT
CATH ANGIO 5F BER2 100CM (CATHETERS) ×4 IMPLANT
CATH DIAG EXPO 6F VENT PIG 145 (CATHETERS) ×8 IMPLANT
CATH EXPO 5FR AL1 (CATHETERS) ×4 IMPLANT
CATH EXPO 5FR FR4 (CATHETERS) ×8 IMPLANT
CATH S G BIP PACING (SET/KITS/TRAYS/PACK) ×8 IMPLANT
CLIP TI MEDIUM 24 (CLIP) ×4 IMPLANT
CLIP TI WIDE RED SMALL 24 (CLIP) ×4 IMPLANT
CONT SPEC 4OZ CLIKSEAL STRL BL (MISCELLANEOUS) ×8 IMPLANT
COVER BACK TABLE 60X90IN (DRAPES) ×4 IMPLANT
COVER BACK TABLE 80X110 HD (DRAPES) ×4 IMPLANT
COVER DOME SNAP 22 D (MISCELLANEOUS) ×4 IMPLANT
COVER MAYO STAND STRL (DRAPES) ×4 IMPLANT
CRADLE DONUT ADULT HEAD (MISCELLANEOUS) ×4 IMPLANT
DERMABOND ADVANCED (GAUZE/BANDAGES/DRESSINGS) ×4
DERMABOND ADVANCED .7 DNX12 (GAUZE/BANDAGES/DRESSINGS) ×4 IMPLANT
DEVICE CLOSURE PERCLS PRGLD 6F (VASCULAR PRODUCTS) ×4 IMPLANT
DRAPE INCISE IOBAN 66X45 STRL (DRAPES) IMPLANT
DRAPE SLUSH MACHINE 52X66 (DRAPES) ×4 IMPLANT
DRSG COVADERM 4X10 (GAUZE/BANDAGES/DRESSINGS) ×8 IMPLANT
DRSG TEGADERM 4X4.75 (GAUZE/BANDAGES/DRESSINGS) ×4 IMPLANT
ELECT REM PT RETURN 9FT ADLT (ELECTROSURGICAL) ×8
ELECTRODE REM PT RTRN 9FT ADLT (ELECTROSURGICAL) ×4 IMPLANT
FELT TEFLON 6X6 (MISCELLANEOUS) ×4 IMPLANT
FEMORAL VENOUS CANN RAP (CANNULA) IMPLANT
GAUZE SPONGE 4X4 12PLY STRL (GAUZE/BANDAGES/DRESSINGS) ×4 IMPLANT
GAUZE SPONGE 4X4 12PLY STRL LF (GAUZE/BANDAGES/DRESSINGS) ×8 IMPLANT
GLOVE BIO SURGEON STRL SZ7.5 (GLOVE) ×4 IMPLANT
GLOVE BIO SURGEON STRL SZ8 (GLOVE) ×8 IMPLANT
GLOVE EUDERMIC 7 POWDERFREE (GLOVE) ×4 IMPLANT
GLOVE ORTHO TXT STRL SZ7.5 (GLOVE) ×4 IMPLANT
GOWN STRL REUS W/ TWL LRG LVL3 (GOWN DISPOSABLE) ×6 IMPLANT
GOWN STRL REUS W/ TWL XL LVL3 (GOWN DISPOSABLE) ×12 IMPLANT
GOWN STRL REUS W/TWL LRG LVL3 (GOWN DISPOSABLE) ×6
GOWN STRL REUS W/TWL XL LVL3 (GOWN DISPOSABLE) ×12
GUIDEWIRE SAF TJ AMPL .035X180 (WIRE) ×4 IMPLANT
GUIDEWIRE SAFE TJ AMPLATZ EXST (WIRE) ×4 IMPLANT
GUIDEWIRE STRAIGHT .035 260CM (WIRE) ×4 IMPLANT
INSERT FOGARTY 61MM (MISCELLANEOUS) ×4 IMPLANT
INSERT FOGARTY SM (MISCELLANEOUS) IMPLANT
INSERT FOGARTY XLG (MISCELLANEOUS) IMPLANT
KIT BASIN OR (CUSTOM PROCEDURE TRAY) ×4 IMPLANT
KIT DILATOR VASC 18G NDL (KITS) IMPLANT
KIT HEART LEFT (KITS) ×4 IMPLANT
KIT ROOM TURNOVER OR (KITS) ×4 IMPLANT
KIT SUCTION CATH 14FR (SUCTIONS) ×8 IMPLANT
NEEDLE PERC 18GX7CM (NEEDLE) ×4 IMPLANT
NS IRRIG 1000ML POUR BTL (IV SOLUTION) ×12 IMPLANT
PACK AORTA (CUSTOM PROCEDURE TRAY) ×4 IMPLANT
PAD ARMBOARD 7.5X6 YLW CONV (MISCELLANEOUS) ×8 IMPLANT
PAD ELECT DEFIB RADIOL ZOLL (MISCELLANEOUS) ×4 IMPLANT
PATCH TACHOSII LRG 9.5X4.8 (VASCULAR PRODUCTS) IMPLANT
PERCLOSE PROGLIDE 6F (VASCULAR PRODUCTS) ×8
SET MICROPUNCTURE 5F STIFF (MISCELLANEOUS) ×4 IMPLANT
SHEATH AVANTI 11CM 8FR (MISCELLANEOUS) ×4 IMPLANT
SHEATH PINNACLE 6F 10CM (SHEATH) ×8 IMPLANT
SLEEVE REPOSITIONING LENGTH 30 (MISCELLANEOUS) ×4 IMPLANT
SPONGE LAP 4X18 X RAY DECT (DISPOSABLE) ×4 IMPLANT
STOPCOCK MORSE 400PSI 3WAY (MISCELLANEOUS) ×24 IMPLANT
SUT ETHIBOND X763 2 0 SH 1 (SUTURE) IMPLANT
SUT GORETEX CV 4 TH 22 36 (SUTURE) IMPLANT
SUT GORETEX CV4 TH-18 (SUTURE) IMPLANT
SUT GORETEX TH-18 36 INCH (SUTURE) IMPLANT
SUT MNCRL AB 3-0 PS2 18 (SUTURE) IMPLANT
SUT PROLENE 3 0 SH1 36 (SUTURE) IMPLANT
SUT PROLENE 4 0 RB 1 (SUTURE)
SUT PROLENE 4-0 RB1 .5 CRCL 36 (SUTURE) IMPLANT
SUT PROLENE 5 0 C 1 36 (SUTURE) IMPLANT
SUT PROLENE 6 0 C 1 30 (SUTURE) IMPLANT
SUT SILK  1 MH (SUTURE) ×2
SUT SILK 1 MH (SUTURE) ×2 IMPLANT
SUT SILK 2 0 FS (SUTURE) ×4 IMPLANT
SUT SILK 2 0 SH CR/8 (SUTURE) IMPLANT
SUT VIC AB 2-0 CT1 27 (SUTURE)
SUT VIC AB 2-0 CT1 TAPERPNT 27 (SUTURE) IMPLANT
SUT VIC AB 2-0 CTX 36 (SUTURE) IMPLANT
SUT VIC AB 3-0 SH 8-18 (SUTURE) IMPLANT
SYR 10ML LL (SYRINGE) ×12 IMPLANT
SYR 30ML LL (SYRINGE) ×8 IMPLANT
SYR 50ML LL SCALE MARK (SYRINGE) ×4 IMPLANT
TOWEL OR 17X26 10 PK STRL BLUE (TOWEL DISPOSABLE) ×8 IMPLANT
TRANSDUCER W/STOPCOCK (MISCELLANEOUS) ×8 IMPLANT
TRAY FOLEY SILVER 14FR TEMP (SET/KITS/TRAYS/PACK) ×4 IMPLANT
TUBE SUCT INTRACARD DLP 20F (MISCELLANEOUS) IMPLANT
TUBING HIGH PRESSURE 120CM (CONNECTOR) ×4 IMPLANT
VALVE HRT TRANSCATH CERT 29MM (Valve) ×4 IMPLANT
WIRE AMPLATZ SS-J .035X180CM (WIRE) ×4 IMPLANT
WIRE BENTSON .035X145CM (WIRE) ×4 IMPLANT

## 2016-07-02 NOTE — Progress Notes (Signed)
  Echocardiogram 2D Echocardiogram has been performed.  [name] [name] L Androw 07/02/2016, 3:31 PM

## 2016-07-02 NOTE — Interval H&P Note (Signed)
History and Physical Interval Note:  07/02/2016 11:57 AM  [name] [name]  has presented today for surgery, with the diagnosis of SEVERE AS  The various methods of treatment have been discussed with the patient and family. After consideration of risks, benefits and other options for treatment, the patient has consented to  Procedure(s): TRANSCATHETER AORTIC VALVE REPLACEMENT, TRANSFEMORAL (N/A) TRANSESOPHAGEAL ECHOCARDIOGRAM (TEE) (N/A) as a surgical intervention .  The patient's history has been reviewed, patient examined, no change in status, stable for surgery.  I have reviewed the patient's chart and labs.  Questions were answered to the patient's satisfaction.     [name]

## 2016-07-02 NOTE — Progress Notes (Signed)
[facility] BRIEF SICU PROGRESS NOTE  Day of Surgery  S/P Procedure(s) (LRB): TRANSCATHETER AORTIC VALVE REPLACEMENT, TRANSFEMORAL (N/A) TRANSESOPHAGEAL ECHOCARDIOGRAM (TEE) (N/A)   Sleepy but arousable. Denies pain, SOB NSR w/ stable BP Both groins look good Foley placed earlier, now some bloody UOP via catheter  Plan: Continue current plan  Rexene Alberts, MD 07/02/2016 7:20 PM

## 2016-07-02 NOTE — Progress Notes (Signed)
07/02/2016 1700 Upon arrival to SICU, RIJ central line noted to be bleeding. Anesthesia at bedside, verbal order to remove line. Also upon arrival to SICU, pt. Complaining of difficulty voiding, bladder scan performed at beside, revealing greater than 600 ml residual urine in bladder. Anesthesia at bedside and verbal order received to place foley. Orders enacted. 700 ml clear yellow urine returned. Pt. States complete relief of symptoms. Will continue to closely monitor patient.  [name] [name], Arville Lime

## 2016-07-02 NOTE — Transfer of Care (Signed)
Immediate Anesthesia Transfer of Care Note  Patient: [name] [name]  Procedure(s) Performed: Procedure(s): TRANSCATHETER AORTIC VALVE REPLACEMENT, TRANSFEMORAL (N/A) TRANSESOPHAGEAL ECHOCARDIOGRAM (TEE) (N/A)  Patient Location: ICU  Anesthesia Type:MAC  Level of Consciousness: drowsy and patient cooperative  Airway & Oxygen Therapy: Patient Spontanous Breathing  Post-op Assessment: Report given to RN, Post -op Vital signs reviewed and stable and Patient moving all extremities  Post vital signs: Reviewed and stable  Last Vitals:  Vitals:   07/02/16 1514 07/02/16 1519  BP:    Pulse: (!) 59 (!) 143  Resp:    Temp:      Last Pain:  Vitals:   07/02/16 1201  TempSrc: Oral         Complications: No apparent anesthesia complications

## 2016-07-02 NOTE — Anesthesia Postprocedure Evaluation (Signed)
Anesthesia Post Note  Patient: [name] [name]  Procedure(s) Performed: Procedure(s) (LRB): TRANSCATHETER AORTIC VALVE REPLACEMENT, TRANSFEMORAL (N/A) TRANSESOPHAGEAL ECHOCARDIOGRAM (TEE) (N/A)     Patient location during evaluation: SICU Anesthesia Type: MAC Level of consciousness: awake and alert Pain management: pain level controlled Vital Signs Assessment: post-procedure vital signs reviewed and stable Respiratory status: spontaneous breathing, nonlabored ventilation, respiratory function stable and patient connected to nasal cannula oxygen Cardiovascular status: stable and blood pressure returned to baseline Anesthetic complications: no    Last Vitals:  Vitals:   07/02/16 1514 07/02/16 1519  BP:    Pulse: (!) 59 (!) 143  Resp:    Temp:      Last Pain:  Vitals:   07/02/16 1201  TempSrc: Oral                 Kalianne Fetting,W. EDMOND

## 2016-07-02 NOTE — H&P (View-Only) (Signed)
Cardiology Office Note:    Date:  06/14/2016   ID:  [name] [name], DOB 1935/11/11, MRN [id number]  PCP:  [name] Dus, MD  Cardiologist:  Dr. Fransico [name]   TAVR:  Dr. Sherren [name]  Urologist: Dr. Tresa [name]  Referring MD: [name] Dus, MD   Chief Complaint  Patient presents with  . 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 with a hx of bicuspid aortic valve with aortic stenosis as well as MVP with mild MR, osteoarthritis of the L hip, HTN, HL.  He was evaluated by Dr. Sherren [name] in 04/2016 for severe aortic stenosis.  LHC on 05/29/16 demonstrated nonobstructive disease in LAD and ramus intermediate. He had hemodynamically significant mid RCA stenosis by FFR. He returned to the hospital on 06/06/16 for planned PCI of the RCA. RCA was treated successfully with DES. He was discharged later that day (same day DC). Of note, CT angiogram of the abdomen and pelvis in 05/2016 didn't demonstrate chronic bladder outlet obstruction including a thickened and heavily trabeculated wall with multiple small diverticuli.   Mr. [name] [name] returns for post hospitalization follow up.  He is here alone.  He has a lot of knee pain from osteoarthritis.  He denies chest pain, significant shortness of breath, orthopnea, PND, edema.  He has occasional dizziness with standing.  He denies syncope.  He denies bleeding issues.   Prior CV studies:   The following studies were reviewed today:  PCI 06/06/16 LAD proximal 60 (FFR 0.85-negative) RI 50 LCx calcified without significant stenosis RCA mid 80 (FFR 0.68-hemodynamically significant) PCI: 2.25 x 16 mm Synergy DES to the mid RCA  Carotid US 05/30/16 R 40-59; L 60-79  LHC 05/29/16 LAD proximal 60-FFR 0.5 RI 50 excellent LCx calcified RCA mid 80-FFR 0.68 EF 55-65, mean gradient 27, AVA 1.0 cm, AVA index or 0.57  Coronary CTA 05/22/16 IMPRESSION: 1. The  valve is tricuspid but functionally bicuspid with partially grown left and right coronary cusp. The valve is severely thickened and calcified with severe restriction in leaflet opening. Annular measurements suitable for delivery of a 29 mm Edwards-SAPIEN 3 valve. 2. Sufficient annulus to coronary distance. 3. Optimum Fluoroscopic Angle for Delivery:  LAO 13 CAU 10. 4. No left atrial thrombus. 5. Dilated pulmonary artery measuring 31 x 29 mm consistent with pulmonary hypertension. 6. 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  Abdominal and pelvic CTA 05/22/16 IMPRESSION: 1. Vascular findings and measurements pertinent to potential TAVR procedure, as detailed above. The patient does appear to have suitable pelvic arterial access bilaterally. 2. Severe thickening calcification of the aortic valve, compatible with the reported clinical history of severe aortic stenosis. 3. **An incidental finding of potential clinical significance has been found. Changes in the urinary bladder suggestive of chronic bladder outlet obstruction, including a thickened and heavily trabeculated wall with multiple small diverticulae. Notably, there is a diverticulum along the posterior aspect of the urinary bladder on the left side which has a small 6 mm filling defect. While this could simply represent some retained debris, the possibility of a small urothelial neoplasm should be considered, and nonemergent Urologic consultation is recommended in the near future to better evaluate this finding.** 4. Aortic atherosclerosis, in addition to left main and 3 vessel coronary artery disease. Assessment for potential risk factor modification, dietary therapy or pharmacologic therapy may  be warranted, if clinically indicated. 5. Colonic diverticulosis without evidence of acute diverticulitis at this time. 6. Additional incidental findings, as above.  Echo 05/07/16 Mild LVH, EF  60-65, Gr 1 DD, severe aortic stenosis (mean 45, peak 76), MAC, severe LAE, mild RAE, mildly increased PASP (41), small pericardial effusion  Past Medical History:  Diagnosis Date  . Actinic keratoses    Dr [name] [name]  . Allergic rhinitis   . Aortic stenosis   . Benign essential HTN   . Bicuspid aortic valve   . Bilateral cataracts   . BPH (benign prostatic hypertrophy)   . Coronary artery disease involving native coronary artery of native heart without angina pectoris    PCI 06/06/16 LAD proximal 60 (FFR 0.85-negative); RI 50; LCx calcified without significant stenosis; RCA mid 80 (FFR 0.68-hemodynamically significant); PCI: 2.25 x 16 mm Synergy DES to the mid RCA  . Dilated aortic root ([name] [name])    43m by echo 08/2015  . Glaucoma   . H/O seasonal allergies   . Hx of adenomatous colonic polyps 1991 on  . Hx of cardiovascular stress test    Myoview (10/15):  normal  . Hyperlipidemia   . Mitral valve prolapse    mild MR by echo 08/2015  . Paresthesia    Chronic left lateral thigh    Past Surgical History:  Procedure Laterality Date  . COLONOSCOPY  multiple  . CORONARY STENT INTERVENTION N/A 06/06/2016   Procedure: Coronary Stent Intervention;  Surgeon: CSherren Mocha MD;  [facility];  Service: Cardiovascular;  Laterality: N/A;  . INTRAVASCULAR PRESSURE WIRE/FFR STUDY N/A 05/29/2016   Procedure: Intravascular Pressure Wire/FFR Study;  Surgeon: CSherren Mocha MD;  [facility];  Service: Cardiovascular;  Laterality: N/A;  . RIGHT/LEFT HEART CATH AND CORONARY ANGIOGRAPHY N/A 05/29/2016   Procedure: Right/Left Heart Cath and Coronary Angiography;  Surgeon: CSherren Mocha MD;  [facility];  Service: Cardiovascular;  Laterality: N/A;  . TONSILLECTOMY  1947    Current Medications: Current Meds  Medication Sig  . acetaminophen (TYLENOL) 500 MG tablet Take 1 tablet (500 mg total) by mouth every 6 (six) hours as needed for moderate  pain.  . Ascorbic Acid (VITAMIN C) 1000 MG tablet Take 1,000 mg by mouth daily.  .Marland Kitchenaspirin 81 MG tablet Take 81 mg by mouth every evening.   . clopidogrel (PLAVIX) 75 MG tablet Take 1 tablet (75 mg total) by mouth daily.  . fluticasone (FLONASE) 50 MCG/ACT nasal spray Place 2 sprays into both nostrils daily as needed for allergies or rhinitis.  . Glucosamine-Chondroit-Vit C-Mn (GLUCOSAMINE CHONDR 1500 COMPLX PO) Take 1 tablet by mouth daily.  .Marland Kitchenlatanoprost (XALATAN) 0.005 % ophthalmic solution Place 1 drop into both eyes at bedtime.  .Marland Kitchenlosartan (COZAAR) 100 MG tablet Take 100 mg by mouth daily.  . Omega-3 Fatty Acids (FISH OIL) 500 MG CAPS Take 500 mg by mouth daily.  . pravastatin (PRAVACHOL) 40 MG tablet Take 40 mg by mouth every evening.   . timolol (BETIMOL) 0.5 % ophthalmic solution Place 1 drop into both eyes 2 (two) times daily.     Allergies:   Patient has no known allergies.   Social History   Social History  . Marital status: Widowed    Spouse name: N/A  . Number of children: N/A  . Years of education: N/A   Social History Main Topics  . Smoking status: Former Smoker    Quit date: 07/14/1960  .  Smokeless tobacco: Never Used  . Alcohol use 3.0 oz/week    5 Cans of beer per week  . Drug use: No  . Sexual activity: Not Asked   Other Topics Concern  . None   Social History Narrative  . None     Family Hx: The patient's family history includes Emphysema in his mother; Heart attack in his brother; Heart disease in his brother; Pancreatic cancer in his father. There is no history of Colon cancer or Stomach cancer.  ROS:   Please see the history of present illness.    Review of Systems  Constitution: Positive for malaise/fatigue.  Neurological: Positive for dizziness.   All other systems reviewed and are negative.   EKGs/Labs/Other Test Reviewed:    EKG:  EKG is  ordered today.  The ekg ordered today demonstrates NSR, HR 61, LAD, anterior Q waves, PVCs, QTc 430 ms,  no changes.   Recent Labs: 05/29/2016: Hemoglobin 14.3 06/04/2016: BUN 13; Creatinine, Ser 0.69; Hemoglobin 13.6; Platelets 286; Potassium 5.2; Sodium 136   Recent Lipid Panel No results found for: CHOL, TRIG, HDL, CHOLHDL, LDLCALC, LDLDIRECT  Physical Exam:    VS:  BP 120/60   Pulse 61   Ht 5' 9"  (1.753 m)   Wt 130 lb 6.4 oz (59.1 kg)   SpO2 97%   BMI 19.26 kg/m     Wt Readings from Last 3 Encounters:  06/14/16 130 lb 6.4 oz (59.1 kg)  06/12/16 140 lb (63.5 kg)  06/06/16 140 lb (63.5 kg)     Physical Exam  Constitutional: He is oriented to person, place, and time. He appears well-developed and well-nourished. No distress.  HENT:  Head: Normocephalic and atraumatic.  Eyes: No scleral icterus.  Neck: Normal range of motion. No JVD present.  Cardiovascular: Normal rate, regular rhythm, S1 normal and S2 normal.   Murmur heard.  Harsh systolic murmur is present with a grade of 2/6  at the upper left sternal border Pulmonary/Chest: Effort normal and breath sounds normal. He has no wheezes. He has no rhonchi. He has no rales.  Abdominal: Soft. There is no tenderness.  Musculoskeletal: He exhibits no edema or deformity (R wrist without hematoma).  Neurological: He is alert and oriented to person, place, and time.  Skin: Skin is warm and dry.  Psychiatric: He has a normal mood and affect.    ASSESSMENT:    1. Coronary artery disease involving native coronary artery of native heart without angina pectoris   2. Nonrheumatic aortic valve stenosis   3. Bladder mass   4. Benign essential HTN   5. Hyperlipidemia, unspecified hyperlipidemia type    PLAN:    In order of problems listed above:  1. Coronary artery disease involving native coronary artery of native heart without angina pectoris -  S/p DES to [facility].  He denies angina.  We discussed the importance of dual antiplatelet Rx.  Continue ASA, Clopidogrel, statin.  2. Nonrheumatic aortic valve stenosis - He has severe  aortic stenosis and is pending TAVR 07/02/16 with Dr. Burt Knack.    3. Bladder mass - He had a CT recently with incidental finding of chronic bladder outlet obstruction with thickened and heavily trabeculated wall.  He sees urology already for BPH.  I have asked [name] to call for follow up.  I will also send my note to his urologist.    4. Benign essential HTN - The patient's blood pressure is controlled on his current regimen.  Continue current therapy.  5. Hyperlipidemia, unspecified hyperlipidemia type - Managed by PCP.  LDL optimal on most recent lab work.  Continue current Rx.    Dispo:  Return to [facility] 07/02/16 for planned TAVR.   Medication Adjustments/Labs and Tests Ordered: Current medicines are reviewed at length with the patient today.  Concerns regarding medicines are outlined above.  Orders/Tests:  Orders Placed This Encounter  Procedures  . EKG 12-Lead   Medication changes: No orders of the defined types were placed in this encounter.  Signed, Richardson Dopp, PA-C  06/14/2016 11:50 AM    [facility] [address], [place], [place]  [address] Phone: [phone]; Fax: [phone]

## 2016-07-02 NOTE — Progress Notes (Signed)
6/19/20181750 Dr. Burt Knack at bedside, verbal order no need to replace potassium at this time. Orders enacted. Will continue to closely monitor patient.  [name] [name], [name] [name]

## 2016-07-02 NOTE — Op Note (Signed)
[facility]ART AND VASCULAR [facility]ENTER   MULTIDISCIPLINARY HEART VALVE TEAM   TAVR OPERATIVE NOTE   Date of Procedure:  07/02/2016  Preoperative Diagnosis: Severe Aortic Stenosis   Postoperative Diagnosis: Same   Procedure:    Transcatheter Aortic Valve Replacement - Percutaneous  Transfemoral Approach  Edwards Sapien 3 THV (size 29 mm, model # 9600TFX, serial # [id number])   Co-Surgeons:  Valentina Gu. Roxy Manns, MD and [name], MD   Anesthesiologist:  Suann Larry, MD  Echocardiographer:  Loralie Champagne, MD  Pre-operative Echo Findings:  Severe aortic stenosis  Normal left ventricular systolic function  Moderate pericardial effusion  Post-operative Echo Findings:  Trace paravalvular leak  Normal left ventricular systolic function  No change in effusion  BRIEF CLINICAL NOTE AND INDICATIONS FOR SURGERY  This is a 81 year old male with severe, stage D bicuspid aortic valve stenosis, coronary artery disease, and end-stage arthritis of the left hip with severely limited physical mobility. The patient is evaluated by the multidisciplinary heart team, including 2 cardiac surgeons. Both surgeons agree that he is not a good candidate for conventional aortic valve replacement with physical frailty, advanced age, and memory impairment. He underwent preoperative evaluation demonstrating severe single-vessel coronary artery disease followed by PCI of the right coronary artery. He presents today for TAVR for treatment of severe symptomatic aortic stenosis.  During the course of the patient's preoperative work up they have been evaluated comprehensively by a multidisciplinary team of specialists coordinated through the [facility] in the [place] [facility].  They have been demonstrated to suffer from symptomatic severe aortic stenosis as noted above. The patient has been counseled extensively as to the relative risks and benefits of all options for  the treatment of severe aortic stenosis including long term medical therapy, conventional surgery for aortic valve replacement, and transcatheter aortic valve replacement.  The patient has been independently evaluated by two cardiac surgeons including Dr. Roxy Manns and Dr. Cyndia Bent, and they are felt to be at moderate risk for conventional surgical aortic valve replacement. Both surgeons indicated the patient would be a poor candidate for conventional surgery because of comorbidities including early dementia, advanced age, physical frailty.   Based upon review of all of the patient's preoperative diagnostic tests they are felt to be candidate for transcatheter aortic valve replacement using the transfemoral approach as an alternative to high risk conventional surgery.    Following the decision to proceed with transcatheter aortic valve replacement, a discussion has been held regarding what types of management strategies would be attempted intraoperatively in the event of life-threatening complications, including whether or not the patient would be considered a candidate for the use of cardiopulmonary bypass and/or conversion to open sternotomy for attempted surgical intervention.  The patient has been advised of a variety of complications that might develop peculiar to this approach including but not limited to risks of death, stroke, paravalvular leak, aortic dissection or other major vascular complications, aortic annulus rupture, device embolization, cardiac rupture or perforation, acute myocardial infarction, arrhythmia, heart block or bradycardia requiring permanent pacemaker placement, congestive heart failure, respiratory failure, renal failure, pneumonia, infection, other late complications related to structural valve deterioration or migration, or other complications that might ultimately cause a temporary or permanent loss of functional independence or other long term morbidity.  The patient provides full  informed consent for the procedure as described and all questions were answered preoperatively.  DETAILS OF THE OPERATIVE PROCEDURE  PREPARATION:   The patient is brought to the  operating room on the above mentioned date and central monitoring was established by the anesthesia team including placement of Swan-Ganz catheter and radial arterial line. The patient is placed in the supine position on the operating table.  Intravenous antibiotics are administered. The patient is monitored closely throughout the procedure under conscious sedation.    Baseline transthoracic echocardiogram was performed. The patient's chest, abdomen, both groins, and both lower extremities are prepared and draped in a sterile manner. A time out procedure is performed.   PERIPHERAL ACCESS:    Using ultrasound guidance, femoral arterial and venous access was obtained with placement of 6 Fr sheaths on the left side.  A pigtail diagnostic catheter was passed through the left femoral arterial sheath under fluoroscopic guidance into the aortic root.  A temporary transvenous pacemaker catheter was passed through the left femoral venous sheath under fluoroscopic guidance into the right ventricle.  The pacemaker was tested to ensure stable lead placement and pacemaker capture. Aortic root angiography was performed in order to determine the optimal angiographic angle for valve deployment.   TRANSFEMORAL ACCESS:  A micropuncture technique is used to access the right femoral artery under fluoroscopic guidance.  Femoral angiography is performed to verify access in the common femoral artery. 2 Perclose devices are deployed at 10' and 2' positions to 'PreClose' the femoral artery. An 8 French sheath is placed and then an Amplatz Superstiff wire is advanced through the sheath. This is changed out for a 16 French transfemoral E-Sheath after progressively dilating over the Superstiff wire.  An AL-1 catheter was used to direct a straight-tip  exchange length wire across the native aortic valve into the left ventricle. The aortic valve is difficult to cross. This was exchanged out for a pigtail catheter and position was confirmed in the LV apex. Simultaneous LV and Ao pressures were recorded.  The pigtail catheter was exchanged for an Amplatz Extra-stiff wire in the LV apex.    TRANSCATHETER HEART VALVE DEPLOYMENT:  An Edwards Sapien 3 transcatheter heart valve (size 29 mm, model #9600TFX, serial #[id number]) was prepared and crimped per manufacturer's guidelines, and the proper orientation of the valve is confirmed on the [employer] delivery system. The valve was advanced through the introducer sheath using normal technique until in an appropriate position in the abdominal aorta beyond the sheath tip. The balloon was then retracted and using the fine-tuning wheel was centered on the valve. The valve was then advanced across the aortic arch using appropriate flexion of the catheter. The valve was carefully positioned across the aortic valve annulus. The Commander catheter was retracted using normal technique. Once final position of the valve has been confirmed by angiographic assessment, the valve is deployed while temporarily holding ventilation and during rapid ventricular pacing to maintain systolic blood pressure < 50 mmHg and pulse pressure < 10 mmHg. The balloon inflation is held for >3 seconds after reaching full deployment volume. Once the balloon has fully deflated the balloon is retracted into the ascending aorta and valve function is assessed using echocardiography. There is felt to be trace paravalvular leak and no central aortic insufficiency.  The patient's hemodynamic recovery following valve deployment is good.  The deployment balloon and guidewire are both removed. Echo demostrated acceptable post-procedural gradients, stable mitral valve function, and trace aortic insufficiency.    PROCEDURE COMPLETION:  The sheath was removed  and femoral artery closure is performed using the 2 previously deployed Perclose devices.   Distal abdominal aortography was performed to evaluate for any  arterial injury related to the procedure. There was no evidence dissection, perforation, or other vascular injury in the abdominal aorta, iliac artery, or femoral artery. Protamine was administered once femoral arterial repair was complete. The temporary pacemaker, pigtail catheters and femoral sheaths were removed with manual pressure used for hemostasis.   The patient tolerated the procedure well and is transported to the surgical intensive care in stable condition. There were no immediate intraoperative complications. All sponge instrument and needle counts are verified correct at completion of the operation.   The patient received a total of 60 mL of intravenous contrast during the procedure.   [name], MD 07/02/2016 4:06 PM

## 2016-07-02 NOTE — Progress Notes (Signed)
07/02/2016 1730 Unable to obtain pt. Temperature upon arrival to SICU. PT. Skin cool to touch. Bear hugger blanket applied to patient. Will continue to closely monitor patient.  Norrine Ballester, Arville Lime

## 2016-07-02 NOTE — Anesthesia Procedure Notes (Addendum)
Central Venous Catheter Insertion Performed by: Roderic Palau, anesthesiologist Start/End6/19/2018 12:26 PM, 07/02/2016 12:36 PM Patient location: Pre-op. Preanesthetic checklist: patient identified, IV checked, site marked, risks and benefits discussed, surgical consent, monitors and equipment checked, pre-op evaluation, timeout performed and anesthesia consent Position: Trendelenburg Lidocaine 1% used for infiltration and patient sedated Hand hygiene performed , maximum sterile barriers used  and Seldinger technique used Catheter size: 8 Fr Total catheter length 16. Central line was placed.Double lumen Procedure performed using ultrasound guided technique. Ultrasound Notes:anatomy identified, needle tip was noted to be adjacent to the nerve/plexus identified, no ultrasound evidence of intravascular and/or intraneural injection and image(s) printed for medical record Attempts: 1 Following insertion, dressing applied, line sutured and Biopatch. Post procedure assessment: blood return through all ports  Patient tolerated the procedure well with no immediate complications.

## 2016-07-02 NOTE — Anesthesia Procedure Notes (Signed)
Procedure Name: MAC Date/Time: 07/02/2016 1:20 PM Performed by: Mervyn Gay Pre-anesthesia Checklist: Patient identified, Patient being monitored, Timeout performed, Emergency Drugs available and Suction available Patient Re-evaluated:Patient Re-evaluated prior to inductionOxygen Delivery Method: Simple face mask Number of attempts: 1 Placement Confirmation: positive ETCO2 Dental Injury: Teeth and Oropharynx as per pre-operative assessment

## 2016-07-02 NOTE — Op Note (Signed)
HEART AND VASCULAR CENTER   MULTIDISCIPLINARY HEART VALVE TEAM   TAVR OPERATIVE NOTE   Date of Procedure:  07/02/2016  Preoperative Diagnosis: Severe Aortic Stenosis   Postoperative Diagnosis: Same   Procedure:    Transcatheter Aortic Valve Replacement - Percutaneous Right Transfemoral Approach  Edwards Sapien 3 THV (size 29 mm, model # 9600TFX, serial # [id number])   Co-Surgeons:  Valentina Gu. Roxy Manns, MD and [name], MD  Anesthesiologist:  Roderic Palau, MD  Echocardiographer:  Loralie Champagne, MD  Pre-operative Echo Findings:  Severe aortic stenosis  Mild aortic insufficiency  Normal left ventricular systolic function  Moderate pericardial effusion  Post-operative Echo Findings:  Trace paravalvular leak  Normal left ventricular systolic function  Unchanged pericardial effusion   BRIEF CLINICAL NOTE AND INDICATIONS FOR SURGERY  Patient is a 81 year old male with bicuspid aortic valve and aortic stenosis, mitral valve prolapse without significant mitral regurgitation, hypertension, hyperlipidemia, benign prostatic hypertrophy, and severe degenerative arthritis of affecting the left hip with severely limited physical mobility with been referred for surgical consultation to discuss treatment options for management of severe symptomatic aortic stenosis.  The patient states that he was first noted to have a heart murmur on routine physical exam performed several years ago prior to screening colonoscopy. He was diagnosed with aortic stenosis and has been followed for several years by Dr. Radford Pax. He developed severe degenerative arthritis of lifting his left hip which has regressed dramatically over the past year to the point where he is now very limited. He ambulates using considerable difficulty with a cane. He has been evaluated by Dr. Reynaldo Minium who has recommended total hip replacement. The patient states that over this period of time he has also developed progressive  fatigue with some exertional shortness of breath and frequent dizzy spells.  He underwent routine transthoracic echocardiogram 05/07/2016 which revealed significant progression in severity patient's aortic stenosis with peak velocity across the aortic valve measured close to 4.4 m/s corresponding to mean transvalvular gradient estimated 45 mmHg. Left ventricular systolic function remained normal with ejection fraction estimated 60-65%.  The patient was referred to the multidisciplinary heart valve clinic and evaluated by Dr. Burt Knack on 05/13/2016. The patient has subsequently undergone CT angiography and has been scheduled for diagnostic cardiac catheterization later this week. 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 The patient was referred for surgical consultation.  During the course of the patient's preoperative work up they have been evaluated comprehensively by a multidisciplinary team of specialists coordinated through the [facility] in the [place] [facility].  They have been demonstrated to suffer from symptomatic severe aortic stenosis as noted above. The patient has been counseled extensively as to the relative risks and benefits of all options for the treatment of severe aortic stenosis including long term medical therapy, conventional surgery for aortic valve replacement, and transcatheter aortic valve replacement.  All questions have been answered, and the patient provides full informed consent for the operation as described.   DETAILS OF THE OPERATIVE PROCEDURE  PREPARATION:    The patient is brought to the operating room on the above mentioned date and central monitoring was established by the anesthesia team including placement of a central venous line and radial  arterial line. The patient is placed in the supine position on the operating  table.  Intravenous antibiotics are administered. The patient is monitored closely throughout the procedure under conscious sedation.  Baseline transthoracic echocardiogram was performed. The patient's chest, abdomen, both groins, and both lower extremities are prepared and draped in a sterile manner. A time out procedure is performed.   PERIPHERAL ACCESS:    Using the modified Seldinger technique, femoral arterial and venous access was obtained with placement of 6 Fr sheaths on the left side.  A pigtail diagnostic catheter was passed through the left arterial sheath under fluoroscopic guidance into the aortic root.  A temporary transvenous pacemaker catheter was passed through the left femoral venous sheath under fluoroscopic guidance into the right ventricle.  The pacemaker was tested to ensure stable lead placement and pacemaker capture. Aortic root angiography was performed in order to determine the optimal angiographic angle for valve deployment.   TRANSFEMORAL ACCESS:   Percutaneous transfemoral access and sheath placement was performed by Dr Burt Knack. Please see his separate operative note for details. The patient was heparinized systemically and ACT verified > 250 seconds.    A 16 Fr transfemoral E-sheath was introduced into the right femoral artery after progressively dilating over an Amplatz superstiff wire. An AL-1 catheter was used to direct a straight-tip exchange length wire across the native aortic valve into the left ventricle. Crossing the valve took several attempts.  This was exchanged out for a pigtail catheter and position was confirmed in the LV apex. Simultaneous LV and Ao pressures were recorded.  The pigtail catheter was exchanged for an Amplatz Extra-stiff wire in the LV apex.  Echocardiography was utilized to confirm appropriate wire position and no sign of entanglement in the mitral subvalvular  apparatus.   TRANSCATHETER HEART VALVE DEPLOYMENT:   An Edwards Sapien 3 transcatheter heart valve (size 29 mm, model #9600TFX, serial #[id number]) was prepared and crimped per manufacturer's guidelines, and the proper orientation of the valve is confirmed on the [employer] delivery system. The valve was advanced through the introducer sheath using normal technique until in an appropriate position in the abdominal aorta beyond the sheath tip. The balloon was then retracted and using the fine-tuning wheel was centered on the valve. The valve was then advanced across the aortic arch using appropriate flexion of the catheter. The valve was carefully positioned across the aortic valve annulus. The Commander catheter was retracted using normal technique. Once final position of the valve has been confirmed by angiographic assessment, the valve is deployed while temporarily holding ventilation and during rapid ventricular pacing to maintain systolic blood pressure < 50 mmHg and pulse pressure < 10 mmHg. The balloon inflation is held for >3 seconds after reaching full deployment volume. Once the balloon has fully deflated the balloon is retracted into the ascending aorta and valve function is assessed using echocardiography. There is felt to be trace paravalvular leak and no central aortic insufficiency.  The patient's hemodynamic recovery following valve deployment is good.  The deployment balloon and guidewire are both removed. Final intraoperative echocardiogram demostrated acceptable post-procedural gradients, stable mitral valve function, trace aortic insufficiency, and stable LV systolic function.   PROCEDURE COMPLETION:   The sheath was removed and femoral artery closure performed by Dr Burt Knack. Please see his separate report for details.  Protamine was administered once femoral arterial repair was complete. The temporary pacemaker, pigtail catheters and femoral sheaths were removed with manual pressure  used for hemostasis.   The patient tolerated the procedure well and is transported to the surgical intensive care in stable  condition. There were no immediate intraoperative complications. All sponge instrument and needle counts are verified correct at completion of the operation.   No blood products were administered during the operation.  The patient received a total of 58 mL of intravenous contrast during the procedure.   Rexene Alberts, MD 07/02/2016 3:31 PM

## 2016-07-03 ENCOUNTER — Inpatient Hospital Stay (HOSPITAL_COMMUNITY): Payer: BC Managed Care – PPO

## 2016-07-03 ENCOUNTER — Encounter (HOSPITAL_COMMUNITY): Payer: Self-pay | Admitting: Cardiovascular Disease

## 2016-07-03 DIAGNOSIS — I350 Nonrheumatic aortic (valve) stenosis: Secondary | ICD-10-CM

## 2016-07-03 DIAGNOSIS — Z954 Presence of other heart-valve replacement: Secondary | ICD-10-CM

## 2016-07-03 DIAGNOSIS — Z952 Presence of prosthetic heart valve: Secondary | ICD-10-CM

## 2016-07-03 LAB — POCT I-STAT 3, ART BLOOD GAS (G3+)
Acid-Base Excess: 4 mmol/L — ABNORMAL HIGH (ref 0.0–2.0)
Bicarbonate: 29 mmol/L — ABNORMAL HIGH (ref 20.0–28.0)
O2 SAT: 92 %
PCO2 ART: 46.5 mmHg (ref 32.0–48.0)
TCO2: 30 mmol/L (ref 0–100)
pH, Arterial: 7.403 (ref 7.350–7.450)
pO2, Arterial: 64 mmHg — ABNORMAL LOW (ref 83.0–108.0)

## 2016-07-03 LAB — POCT I-STAT 4, (NA,K, GLUC, HGB,HCT)
GLUCOSE: 101 mg/dL — AB (ref 65–99)
HEMATOCRIT: 36 % — AB (ref 39.0–52.0)
Hemoglobin: 12.2 g/dL — ABNORMAL LOW (ref 13.0–17.0)
Potassium: 3.8 mmol/L (ref 3.5–5.1)
Sodium: 138 mmol/L (ref 135–145)

## 2016-07-03 MED ORDER — LOSARTAN POTASSIUM 50 MG PO TABS
100.0000 mg | ORAL_TABLET | Freq: Every day | ORAL | Status: DC
  Administered 2016-07-03 – 2016-07-04 (×2): 100 mg via ORAL
  Filled 2016-07-03 (×2): qty 2

## 2016-07-03 MED ORDER — SODIUM CHLORIDE 0.9% FLUSH: 3.0000 mL | INTRAVENOUS | Status: DC | PRN

## 2016-07-03 MED ORDER — FAMOTIDINE 20 MG PO TABS
20.0000 mg | ORAL_TABLET | Freq: Two times a day (BID) | ORAL | Status: DC
  Administered 2016-07-03 – 2016-07-04 (×3): 20 mg via ORAL
  Filled 2016-07-03 (×3): qty 1

## 2016-07-03 MED ORDER — SODIUM CHLORIDE 0.9% FLUSH
3.0000 mL | Freq: Two times a day (BID) | INTRAVENOUS | Status: DC
  Administered 2016-07-03: 3 mL via INTRAVENOUS

## 2016-07-03 MED ORDER — ACETAMINOPHEN-CODEINE #3 300-30 MG PO TABS
1.0000 | ORAL_TABLET | ORAL | Status: DC | PRN
  Administered 2016-07-03: 1 via ORAL
  Administered 2016-07-04: 2 via ORAL
  Filled 2016-07-03: qty 1
  Filled 2016-07-03: qty 2

## 2016-07-03 MED ORDER — TIMOLOL MALEATE 0.5 % OP SOLN
1.0000 [drp] | Freq: Two times a day (BID) | OPHTHALMIC | Status: DC
  Administered 2016-07-03 – 2016-07-04 (×3): 1 [drp] via OPHTHALMIC
  Filled 2016-07-03: qty 5

## 2016-07-03 MED ORDER — MOVING RIGHT ALONG BOOK
Freq: Once | Status: AC
  Administered 2016-07-03: 10:00:00
  Filled 2016-07-03: qty 1

## 2016-07-03 MED ORDER — SODIUM CHLORIDE 0.9 % IV SOLN: 250.0000 mL | INTRAVENOUS | Status: DC | PRN

## 2016-07-03 MED FILL — Heparin Sodium (Porcine) Inj 1000 Unit/ML: INTRAMUSCULAR | Qty: 30 | Status: AC

## 2016-07-03 MED FILL — Magnesium Sulfate Inj 50%: INTRAMUSCULAR | Qty: 10 | Status: AC

## 2016-07-03 MED FILL — Insulin Regular (Human) Inj 100 Unit/ML: INTRAMUSCULAR | Qty: 100 | Status: AC

## 2016-07-03 MED FILL — Potassium Chloride Inj 2 mEq/ML: INTRAVENOUS | Qty: 40 | Status: AC

## 2016-07-03 NOTE — Care Management Note (Signed)
Case Management Note Marvetta Gibbons RN, BSN Unit 2W-Case Manager-- [place] coverage [phone]  Patient Details  Name: [name] [name] MRN: [id number] Date of Birth: 1935/04/06  Subjective/Objective:   Pt admitted s/p TAVR 07/02/16                 Action/Plan: PTA pt lived at home alone- anticipate return home- CM to follow  Expected Discharge Date:                  Expected Discharge Plan:  Home/Self Care  In-House Referral:     Discharge planning Services  CM Consult  Post Acute Care Choice:    Choice offered to:     DME Arranged:    DME Agency:     HH Arranged:    [place] Agency:     Status of Service:  In process, will continue to follow  If discussed at Long Length of Stay Meetings, dates discussed:    Discharge Disposition:   Additional Comments:  Dawayne Patricia, RN 07/03/2016, 10:26 AM

## 2016-07-03 NOTE — Progress Notes (Signed)
Progress Note  Patient Name: XYLAN SHEILS Date of Encounter: 07/03/2016  Primary Cardiologist: Radford Pax  Subjective   Feels well. No CP or dyspnea. No c/o this am.   Inpatient Medications    Scheduled Meds: . aspirin EC  81 mg Oral Daily  . clopidogrel  75 mg Oral Daily  . latanoprost  1 drop Both Eyes QHS  . metoprolol tartrate  12.5 mg Oral BID   Or  . metoprolol tartrate  12.5 mg Per Tube BID  . [START ON 07/04/2016] pantoprazole  40 mg Oral Daily  . pravastatin  40 mg Oral QPM  . timolol  1 drop Both Eyes BID   Continuous Infusions: . albumin human    . cefUROXime (ZINACEF)  IV Stopped (07/02/16 1913)  . famotidine (PEPCID) IV    . lactated ringers    . nitroGLYCERIN Stopped (07/02/16 1645)  . phenylephrine (NEO-SYNEPHRINE) Adult infusion Stopped (07/02/16 1645)   PRN Meds: albumin human, lactated ringers, metoprolol tartrate, morphine injection, ondansetron (ZOFRAN) IV, traMADol   Vital Signs    Vitals:   07/03/16 0300 07/03/16 0400 07/03/16 0500 07/03/16 0600  BP: 126/63 (!) 124/59 (!) 128/55 139/65  Pulse: 61 61 (!) 58 77  Resp: 18 14 11 20   Temp:  98 F (36.7 C)    TempSrc:  Oral    SpO2: 98% 100% 97% 97%  Weight:   125 lb 14.1 oz (57.1 kg)   Height:        Intake/Output Summary (Last 24 hours) at 07/03/16 0730 Last data filed at 07/03/16 0600  Gross per 24 hour  Intake           1876.8 ml  Output             1765 ml  Net            111.8 ml   Filed Weights   07/02/16 1137 07/02/16 1201 07/03/16 0500  Weight: 125 lb 6 oz (56.9 kg) 125 lb (56.7 kg) 125 lb 14.1 oz (57.1 kg)    Telemetry    Sinus rhythm, few PVC's - Personally Reviewed  ECG    Sinus bradycardia 51 bpm, occasional PVC's, LAFB - Personally Reviewed  Physical Exam  Elderly male sitting in chair in no distress, alert and oriented GEN: No acute distress.   Neck: No JVD. Dressing over right IJ site is in place with dried blood Cardiac: RRR, no murmurs, rubs, or gallops.    Respiratory: Clear to auscultation bilaterally. GI: Soft, nontender, non-distended  MS: No edema; No deformity. Bilateral groin sites are clear Neuro:  Nonfocal  Psych: Normal affect   Labs    Chemistry Recent Labs Lab 06/28/16 1155 07/02/16 1352 07/02/16 1450 07/02/16 1532  NA 134* 137 138 138  K 4.7 4.1 4.2 4.2  CL 97* 97* 98* 98*  CO2 29  --   --   --   GLUCOSE 103* 106* 106* 113*  BUN 14 12 12 11   CREATININE 0.71 0.40* 0.50* 0.40*  CALCIUM 9.5  --   --   --   PROT 7.3  --   --   --   ALBUMIN 4.0  --   --   --   AST 20  --   --   --   ALT 12*  --   --   --   ALKPHOS 72  --   --   --   BILITOT 0.7  --   --   --  GFRNONAA >60  --   --   --   GFRAA >60  --   --   --   ANIONGAP 8  --   --   --      Hematology Recent Labs Lab 06/28/16 1155  07/02/16 1450 07/02/16 1532 07/02/16 1651  WBC 8.4  --   --   --  6.7  RBC 4.75  --   --   --  4.13*  HGB 14.4  < > 11.9* 11.9* 12.3*  HCT 45.3  < > 35.0* 35.0* 38.3*  MCV 95.4  --   --   --  92.7  MCH 30.3  --   --   --  29.8  MCHC 31.8  --   --   --  32.1  RDW 13.2  --   --   --  12.8  PLT 265  --   --   --  190  < > = values in this interval not displayed.  Cardiac EnzymesNo results for input(s): TROPONINI in the last 168 hours. No results for input(s): TROPIPOC in the last 168 hours.   BNPNo results for input(s): BNP, PROBNP in the last 168 hours.   DDimer No results for input(s): DDIMER in the last 168 hours.   Radiology    Dg Chest Port 1 View  Result Date: 07/02/2016 CLINICAL DATA:  81 y/o  M; atelectasis. EXAM: PORTABLE CHEST 1 VIEW COMPARISON:  06/28/2016 chest radiograph. FINDINGS: Stable mild cardiomegaly. Aortic valve replacement. Aortic atherosclerosis with calcification. No focal consolidation. No pleural effusion or pneumothorax identified. Bones are unremarkable. IMPRESSION: Stable cardiomegaly.  No acute pulmonary process identified. Electronically Signed   By: Kristine Garbe M.D.   On:  07/02/2016 18:09    Cardiac Studies   Postoperative day #1 echo is pending  Patient Profile     81 y.o. male with severe symptomatic aortic stenosis admitted for TAVR 07/02/2016  Assessment & Plan    1. Severe, stage D, aortic stenosis: Patient doing well postoperative day #1 from TAVR. Will check his postop Echo study. He will be transferred to a telemetry bed. He will continue on aspirin and Plavix. Will DC his arterial line and Foley catheter and mobilize him today. Phase I cardiac rehabilitation is consulted.  2. Coronary artery disease, native vessel: No angina. He continues on aspirin and Plavix after recent coronary stenting.  3. Hypertension: Resume losartan. Otherwise continue current medications.  Disposition: Transfer to 2 W. today, mobilize, repeat labs tomorrow morning, anticipate discharge tomorrow as long as no problems arise.  Deatra James, MD  07/03/2016, 7:30 AM

## 2016-07-03 NOTE — Progress Notes (Signed)
Patients neck where RIJ was has a slow ooze of blood coming from it. I held pressure for 20 minutes and placed a tight pressure dressing. Dr. Copper notified and per MD will watch site and keep a pressure dressing on it. Will continue to monitor closely.

## 2016-07-03 NOTE — Progress Notes (Signed)
      [address]       [place] [address]             [phone]        CARDIOTHORACIC SURGERY PROGRESS NOTE   R1 Day Post-Op Procedure(s) (LRB): TRANSCATHETER AORTIC VALVE REPLACEMENT, TRANSFEMORAL (N/A) TRANSESOPHAGEAL ECHOCARDIOGRAM (TEE) (N/A)  Subjective: Looks good and feels fine  Objective: Vital signs: BP Readings from Last 1 Encounters:  07/03/16 (!) 141/105   Pulse Readings from Last 1 Encounters:  07/03/16 72   Resp Readings from Last 1 Encounters:  07/03/16 18   Temp Readings from Last 1 Encounters:  07/03/16 98.4 F (36.9 C) (Oral)    Hemodynamics:    Physical Exam:  Rhythm:   sinus  Breath sounds: clear  Heart sounds:  RRR w/out murmur  Incisions:  Both groins okay  Abdomen:  Soft, non-distended, non-tender  Extremities:  Warm, well-perfused    Intake/Output from previous day: 06/19 0701 - 06/20 0700 In: 1876.8 [I.V.:1826.8; IV Piggyback:50] Out: 1765 [Urine:1665; Blood:100] Intake/Output this shift: Total I/O In: -  Out: 125 [Urine:125]  Lab Results:  CBC: Recent Labs  07/02/16 1650 07/02/16 1651  WBC  --  6.7  HGB 12.2* 12.3*  HCT 36.0* 38.3*  PLT  --  190    BMET:  Recent Labs  07/02/16 1450 07/02/16 1532 07/02/16 1650  NA 138 138 138  K 4.2 4.2 3.8  CL 98* 98*  --   GLUCOSE 106* 113* 101*  BUN 12 11  --   CREATININE 0.50* 0.40*  --      PT/INR:   Recent Labs  07/02/16 1651  LABPROT 15.6*  INR 1.23    CBG (last 3)  No results for input(s): GLUCAP in the last 72 hours.  ABG    Component Value Date/Time   PHART 7.422 07/02/2016 1853   PCO2ART 45.2 07/02/2016 1853   PO2ART 217.0 (H) 07/02/2016 1853   HCO3 29.8 (H) 07/02/2016 1853   TCO2 31 07/02/2016 1853   O2SAT 100.0 07/02/2016 1853    CXR: PORTABLE CHEST 1 VIEW  COMPARISON:  07/02/2016  FINDINGS: Cardiac shadow is mildly enlarged but stable. Aortic valve replacement is again seen and stable. The lungs are well  aerated bilaterally. No focal infiltrate or sizable effusion is seen. No bony abnormality is noted.  IMPRESSION: No acute abnormality seen.   Electronically Signed   By: Inez Catalina M.D.   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   Rexene Alberts, MD 07/03/2016 9:21 AM

## 2016-07-03 NOTE — Progress Notes (Signed)
CARDIAC REHAB PHASE I   PRE:  Rate/Rhythm: 64 SR    BP: sitting 130/60    SaO2: 98 RA  MODE:  Ambulation: 250 ft   POST:  Rate/Rhythm: 83 SR    BP: sitting 148/60     SaO2: 98 RA  Pt out of bed independently but limping and in pain several feet with RW due to left hip and referred left knee pain. This improved with distance and he was able to walk with RW without being unsteady or limping. No major c/o except knee pain. To recliner. VSS, friends came in. Will f/u tomorrow. He needs RW for home. [place], ACSM 07/03/2016 12:20 PM

## 2016-07-04 ENCOUNTER — Other Ambulatory Visit: Payer: Self-pay

## 2016-07-04 ENCOUNTER — Inpatient Hospital Stay (HOSPITAL_COMMUNITY): Payer: BC Managed Care – PPO

## 2016-07-04 ENCOUNTER — Encounter (HOSPITAL_COMMUNITY): Payer: Self-pay | Admitting: General Practice

## 2016-07-04 DIAGNOSIS — Z952 Presence of prosthetic heart valve: Secondary | ICD-10-CM

## 2016-07-04 DIAGNOSIS — I313 Pericardial effusion (noninflammatory): Secondary | ICD-10-CM

## 2016-07-04 DIAGNOSIS — I360 Nonrheumatic tricuspid (valve) stenosis: Secondary | ICD-10-CM

## 2016-07-04 DIAGNOSIS — I350 Nonrheumatic aortic (valve) stenosis: Secondary | ICD-10-CM

## 2016-07-04 DIAGNOSIS — I3139 Other pericardial effusion (noninflammatory): Secondary | ICD-10-CM

## 2016-07-04 LAB — BASIC METABOLIC PANEL
ANION GAP: 4 — AB (ref 5–15)
BUN: 15 mg/dL (ref 6–20)
CHLORIDE: 100 mmol/L — AB (ref 101–111)
CO2: 31 mmol/L (ref 22–32)
Calcium: 8.6 mg/dL — ABNORMAL LOW (ref 8.9–10.3)
Creatinine, Ser: 0.67 mg/dL (ref 0.61–1.24)
Glucose, Bld: 100 mg/dL — ABNORMAL HIGH (ref 65–99)
POTASSIUM: 4.1 mmol/L (ref 3.5–5.1)
SODIUM: 135 mmol/L (ref 135–145)

## 2016-07-04 LAB — CBC
HCT: 37.1 % — ABNORMAL LOW (ref 39.0–52.0)
HEMOGLOBIN: 11.7 g/dL — AB (ref 13.0–17.0)
MCH: 29.7 pg (ref 26.0–34.0)
MCHC: 31.5 g/dL (ref 30.0–36.0)
MCV: 94.2 fL (ref 78.0–100.0)
PLATELETS: 177 10*3/uL (ref 150–400)
RBC: 3.94 MIL/uL — AB (ref 4.22–5.81)
RDW: 13.1 % (ref 11.5–15.5)
WBC: 8.2 10*3/uL (ref 4.0–10.5)

## 2016-07-04 LAB — ECHOCARDIOGRAM COMPLETE
Height: 69.5 in
WEIGHTICAEL: 2193.6 [oz_av]

## 2016-07-04 MED ORDER — METOPROLOL TARTRATE 25 MG PO TABS: 12.5000 mg | ORAL_TABLET | Freq: Two times a day (BID) | ORAL | 1 refills | Status: DC

## 2016-07-04 MED ORDER — ACETAMINOPHEN-CODEINE #3 300-30 MG PO TABS: 1.0000 | ORAL_TABLET | Freq: Three times a day (TID) | ORAL | 0 refills | Status: DC | PRN

## 2016-07-04 NOTE — Progress Notes (Signed)
  Echocardiogram 2D Echocardiogram has been performed.  Jennette Dubin 07/04/2016, 10:04 AM

## 2016-07-04 NOTE — Progress Notes (Addendum)
[address]       [place] [address]             [phone]      2 Days Post-Op Procedure(s) (LRB): TRANSCATHETER AORTIC VALVE REPLACEMENT, TRANSFEMORAL (N/A) TRANSESOPHAGEAL ECHOCARDIOGRAM (TEE) (N/A) Subjective: Feels well  Objective: Vital signs in last 24 hours: Temp:  [97.4 F (36.3 C)-98.5 F (36.9 C)] 97.4 F (36.3 C) (06/21 0450) Pulse Rate:  [61-76] 61 (06/21 0450) Cardiac Rhythm: Heart block (06/21 0701) Resp:  [16-21] 16 (06/21 0450) BP: (127-150)/(40-105) 136/47 (06/21 0450) SpO2:  [94 %-100 %] 95 % (06/21 0450) Arterial Line BP: (202)/(90) 202/90 (06/20 0800) Weight:  [137 lb 1.6 oz (62.2 kg)] 137 lb 1.6 oz (62.2 kg) (06/21 0450)  Hemodynamic parameters for last 24 hours:    Intake/Output from previous day: 06/20 0701 - 06/21 0700 In: 560 [P.O.:510; IV Piggyback:50] Out: 650 [Urine:650] Intake/Output this shift: No intake/output data recorded.  General appearance: alert, cooperative and no distress Heart: regular rate and rhythm and no murmur Lungs: clear to auscultation bilaterally Abdomen: benign Extremities: no edema Wound: groins without hematoma  Lab Results:  Recent Labs  07/02/16 1651 07/04/16 0350  WBC 6.7 8.2  HGB 12.3* 11.7*  HCT 38.3* 37.1*  PLT 190 177   BMET:  Recent Labs  07/02/16 1532 07/02/16 1650 07/04/16 0350  NA 138 138 135  K 4.2 3.8 4.1  CL 98*  --  100*  CO2  --   --  31  GLUCOSE 113* 101* 100*  BUN 11  --  15  CREATININE 0.40*  --  0.67  CALCIUM  --   --  8.6*    PT/INR:  Recent Labs  07/02/16 1651  LABPROT 15.6*  INR 1.23   ABG    Component Value Date/Time   PHART 7.422 07/02/2016 1853   HCO3 29.8 (H) 07/02/2016 1853   TCO2 31 07/02/2016 1853   O2SAT 100.0 07/02/2016 1853   CBG (last 3)  No results for input(s): GLUCAP in the last 72 hours.  Meds Scheduled Meds: . aspirin EC  81 mg Oral Daily  . clopidogrel  75 mg Oral Daily  . famotidine  20 mg Oral BID  .  latanoprost  1 drop Both Eyes QHS  . losartan  100 mg Oral Daily  . metoprolol tartrate  12.5 mg Oral BID  . pantoprazole  40 mg Oral Daily  . pravastatin  40 mg Oral QPM  . sodium chloride flush  3 mL Intravenous Q12H  . timolol  1 drop Both Eyes BID   Continuous Infusions: . sodium chloride    . cefUROXime (ZINACEF)  IV Stopped (07/03/16 2025)  . lactated ringers     PRN Meds:.sodium chloride, acetaminophen-codeine, lactated ringers, metoprolol tartrate, ondansetron (ZOFRAN) IV, sodium chloride flush  Xrays Dg Chest Port 1 View  Result Date: 07/03/2016 CLINICAL DATA:  Chest pain EXAM: PORTABLE CHEST 1 VIEW COMPARISON:  07/02/2016 FINDINGS: Cardiac shadow is mildly enlarged but stable. Aortic valve replacement is again seen and stable. The lungs are well aerated bilaterally. No focal infiltrate or sizable effusion is seen. No bony abnormality is noted. IMPRESSION: No acute abnormality seen. Electronically Signed   By: Inez Catalina M.D.   On: 07/03/2016 07:33   Dg Chest Port 1 View  Result Date: 07/02/2016 CLINICAL DATA:  81 y/o  M; atelectasis. EXAM: PORTABLE CHEST 1 VIEW COMPARISON:  06/28/2016 chest radiograph. FINDINGS: Stable mild cardiomegaly. Aortic valve replacement.  Aortic atherosclerosis with calcification. No focal consolidation. No pleural effusion or pneumothorax identified. Bones are unremarkable. IMPRESSION: Stable cardiomegaly.  No acute pulmonary process identified. Electronically Signed   By: Kristine Garbe M.D.   On: 07/02/2016 18:09    Assessment/Plan: S/P Procedure(s) (LRB): TRANSCATHETER AORTIC VALVE REPLACEMENT, TRANSFEMORAL (N/A) TRANSESOPHAGEAL ECHOCARDIOGRAM (TEE) (N/A) Plan for discharge: see discharge orders echo pending hemodyn stable    labs stable   LOS: 2 days    GOLD,WAYNE E 07/04/2016   I have seen and examined the patient and agree with the assessment and plan as outlined.  Looks good.  Anticipate d/c home today once ECHO  completed.  D/C instructions reviewed.  Rexene Alberts, MD 07/04/2016 8:40 AM

## 2016-07-04 NOTE — Discharge Summary (Signed)
Physician Discharge Summary  Patient ID: [name] [name] MRN: [id number] DOB/AGE: [birth year] 79 y.o.  Admit date: 07/02/2016 Discharge date: 07/04/2016  Admission Diagnoses:severe AS  Discharge Diagnoses:  Principal Problem:   S/P TAVR (transcatheter aortic valve replacement) Active Problems:   Aortic stenosis   Bicuspid aortic valve   Arthritis of left hip   Benign essential HTN   Coronary artery disease involving native coronary artery of native heart without angina pectoris  Patient Active Problem List   Diagnosis Date Noted  . S/P TAVR (transcatheter aortic valve replacement) 07/02/2016  . Bladder mass 06/14/2016  . Abnormal fractional flow reserve (FFR) on cardiac catheterization 06/05/2016  . Coronary artery disease involving native coronary artery of native heart without angina pectoris   . Benign essential HTN   . Bilateral hand pain 08/14/2015  . Arthritis of left hip 11/03/2014  . Elevated blood pressure 11/03/2014  . Left knee pain 10/06/2014  . Aortic stenosis 09/09/2013  . Bicuspid aortic valve 09/09/2013  . Family history of premature CAD 09/09/2013  . History of colonic polyps 05/12/2007  . HLD (hyperlipidemia) 05/12/2007  . INTERNAL HEMORRHOIDS 05/12/2007   History of Present Illness: [name] [name] is a 81 y.o. male who presents for evaluation of severe aortic stenosis, referred by Dr Radford Pax.  Reports he was first noted to have a heart murmur approximately 4 years ago and he has been followed by Dr Radford Pax since that time. He has a hx of both MVP with mild MR and aortic stenosis with bicuspid aortic valve.   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 He has been active in the past but now is quite limited by pain. He is followed by Dr Wynelle Link who has recommended a total hip replacement. The patient has difficulty ambulating and is using a cane.   He is here alone today. Feels very fatigued, especially in the  afternoons. He was previously active and able to hike in the woods as recently as 2 years ago without any symptoms. Now is unable to swim or do much walking at all because of his hip problem. He specifically denies chest pain, chest pressure, or shortness of breath. Reports some lightheadedness, no syncope. Feels unsteady on his feet.   The patient works as a Animal nutritionist at [employer]. He is a widower since his wife passed away in 04-14-07.   He was admitted for elective TAVR  Discharged Condition: good  Hospital Course: The patient was admitted on 07/02/2016 and was taken the operating room where she underwent the below described procedure. She tolerated well and was taken to the surgical intensive care unit in stable condition.  Post operative Hospital course:  Overall the patient was done quite well. She has remained neurologically intact. Both groin sites look good without evidence of hematoma. Postoperatively she is on aspirin and Plavix. She is tolerating routine activities. Echocardiogram is currently pending. Laboratory values are stable. She is in sinus rhythm. She is tolerating routine activities commensurate for postoperative convalescence. At time of discharge she is felt to be quite stable.  Consults: cardiology  Significant Diagnostic Studies: post op echocardiogram  Treatments: surgery:   TAVR OPERATIVE NOTE   Date of Procedure:                07/02/2016  Preoperative Diagnosis:      Severe Aortic Stenosis   Postoperative Diagnosis:    Same   Procedure:  Transcatheter Aortic Valve Replacement - Percutaneous  Transfemoral Approach             Edwards Sapien 3 THV (size 29 mm, model # 9600TFX, serial # [id number])              Co-Surgeons:                        Valentina Gu. Roxy Manns, MD and [name], MD   Anesthesiologist:                  Suann Larry, MD  Echocardiographer:              Loralie Champagne, MD  Pre-operative Echo  Findings: ? Severe aortic stenosis ? Normal left ventricular systolic function ? Moderate pericardial effusion  Post-operative Echo Findings: ? Trace paravalvular leak ? Normal left ventricular systolic function ? No change in effusion  Discharge Exam: Blood pressure (!) 136/47, pulse 61, temperature 97.4 F (36.3 C), temperature source Oral, resp. rate 16, height 5' 9.5" (1.765 m), weight 137 lb 1.6 oz (62.2 kg), SpO2 95 %.  General appearance: alert, cooperative and no distress Heart: regular rate and rhythm and no murmur Lungs: clear to auscultation bilaterally Abdomen: benign Extremities: no edema Wound: groins without hematoma  Disposition: 01-Home or Self Care   Allergies as of 07/04/2016      Reactions   No Known Allergies       Medication List    TAKE these medications   acetaminophen 500 MG tablet Commonly known as:  TYLENOL Take 1 tablet (500 mg total) by mouth every 6 (six) hours as needed for moderate pain. What changed:  how much to take  when to take this  reasons to take this   acetaminophen-codeine 300-30 MG tablet Commonly known as:  TYLENOL #3 Take 1-2 tablets by mouth every 8 (eight) hours as needed for moderate pain.   aspirin EC 81 MG tablet Take 81 mg by mouth daily.   cetaphil cream Apply 1 application topically daily.   clopidogrel 75 MG tablet Commonly known as:  PLAVIX Take 1 tablet (75 mg total) by mouth daily.   Fish Oil 500 MG Caps Take 500 mg by mouth at bedtime.   GLUCOSAMINE CHONDR 1500 COMPLX PO Take 1 tablet by mouth daily.   latanoprost 0.005 % ophthalmic solution Commonly known as:  XALATAN Place 1 drop into both eyes at bedtime.   losartan 100 MG tablet Commonly known as:  COZAAR Take 100 mg by mouth daily.   metoprolol tartrate 25 MG tablet Commonly known as:  LOPRESSOR Take 0.5 tablets (12.5 mg total) by mouth 2 (two) times daily.   pravastatin 40 MG tablet Commonly known as:  PRAVACHOL Take 40 mg by  mouth every evening.   timolol 0.5 % ophthalmic solution Commonly known as:  BETIMOL Place 1 drop into both eyes 2 (two) times daily.   vitamin C 1000 MG tablet Take 1,000 mg by mouth at bedtime.            Durable Medical Equipment        Start     Ordered   07/04/16 [phone]  For home use only DME Walker rolling  Once    Question:  Patient needs a walker to treat with the following condition  Answer:  Weakness   07/04/16 0850     Follow-up Information    [name], MD Follow up.   Specialty:  Cardiology Why:  office will arrange a 1 week and 1 month follow-up appointment. also see discharge paperwork Contact information: [address] [place] [address] [phone]           Signed: John Giovanni 07/04/2016, 9:51 AM

## 2016-07-04 NOTE — Discharge Instructions (Signed)
ACTIVITY AND EXERCISE °• Daily activity and exercise are an important part °of your recovery. People recover at different rates °depending on their general health and type of °valve procedure. °• Most people require six to 10 weeks to feel °recovered. °• No lifting, pushing, pulling more than 10 pounds °(examples to avoid: groceries, vacuuming, °gardening, golfing): °- For one week with a procedure through the groin. °- For six weeks for procedures through the chest °wall. °- For three months for procedures through the °breast-bone. °• After the initial healing process of the access site, °we recommend cardiac rehabilitation for all TAVR °patients. Cardiac rehabilitation will help you: °- Rebuild stamina, strength and balance. °- Learn how to participate in activities safely, as well °as help you regain confidence to do so. °- Return to activities of daily living and leisure. °• Discuss attending cardiac rehabilitation at your °follow-up appointment  ° °DRIVING °• Do not drive for four weeks after the date of your °procedure. °• If you have been told by your doctor in the past °that you may not drive, you must talk with him/her °before you begin driving again. °• When you resume driving, you must have someone °with you. ° °HYGIENE °If you had a femoral (leg) procedure, you may take a shower when you return home. After the shower, pat the °site dry. Do NOT use powder, oils or lotions in your groin area until the site has completely healed. °• If you had a chest procedure, you may shower when you return home unless specifically instructed not to by °your discharging practitioner. °- DO NOT scrub incision; pat dry with a towel °- DO NOT apply any lotions, oils, powders to the incision °- No tub baths / swimming for at least six weeks. ° °SITE CARE °• You likely will have small openings in both groins °from catheters used during the procedure. If you °had a transfemoral procedure, one groin will have a °larger opening  and may be bruised or tender. °• If you had a chest procedure, you will have either a °small incision in your upper sternum (breast-bone) °or between your ribs on your left side. °- Chest wall site: The surgical incision should be °kept dry (no lotions / oils / powders) and open °to air. If you experience irritation from clothing °rubbing on the incision, a light gauze dressing °may be applied. °- Inspect your incision daily; notify your °physician if there is increased redness, swelling °or drainage from the incision. °- If the incision is located on your breast-bone °you must avoid lifting objects heavier than a °gallon of milk (eight pounds) and stretching / °twisting / pulling with your arms for at least °three months to ensure strong bone healing. ° ° °CONTACT [phone]- °• Check your sites daily. Contact our office if you have °any of the following problems: °- Redness and warmth that does not go away °- Yellow or green drainage from the wound °- Fever and chills °- Increasing numbness in your legs °- Worsening pain at the site °• If you had a leg/groin procedure, it is normal to °have bruising or a soft lump at the site. It is not °normal if the lump suddenly becomes larger or °more firm. This may mean you are bleeding. If this °happens: °- Lie down °- Have someone press down hard, just above °the hole in your skin where the procedure was °performed for 15 minutes. If after holding on the °site, the lump does not become larger or harder, °they   are performing this correctly. °- If the bleeding has stopped after 15 minutes, rest °and stay laying down for at least two hours. °- If the bleeding continues, call 911 for an °ambulance. Do NOT drive yourself or have °someone else drive you. °

## 2016-07-04 NOTE — Progress Notes (Signed)
CARDIAC REHAB PHASE I    Ed completed with pt and his niece. Voiced understanding. His walking is limited but thinking about CRPII to try other machines. Will refer to [facility]. Encouraged IS. Has pool at home, discussed waiting till he is cleared by MD on incisions. [place], ACSM 07/04/2016 10:53 AM

## 2016-07-04 NOTE — Progress Notes (Addendum)
Progress Note  Patient Name: [name] [name] Date of Encounter: 07/04/2016  Primary Cardiologist: Radford Pax  Subjective   The patient is doing well this morning. He denies chest pain or shortness of breath. Able to walk yesterday only limited by hip pain. His niece is at the bedside this morning.  Inpatient Medications    Scheduled Meds: . aspirin EC  81 mg Oral Daily  . clopidogrel  75 mg Oral Daily  . famotidine  20 mg Oral BID  . latanoprost  1 drop Both Eyes QHS  . losartan  100 mg Oral Daily  . metoprolol tartrate  12.5 mg Oral BID  . pantoprazole  40 mg Oral Daily  . pravastatin  40 mg Oral QPM  . sodium chloride flush  3 mL Intravenous Q12H  . timolol  1 drop Both Eyes BID   Continuous Infusions: . sodium chloride    . cefUROXime (ZINACEF)  IV Stopped (07/03/16 2025)  . lactated ringers     PRN Meds: sodium chloride, acetaminophen-codeine, lactated ringers, metoprolol tartrate, ondansetron (ZOFRAN) IV, sodium chloride flush   Vital Signs    Vitals:   07/03/16 1000 07/03/16 1113 07/03/16 2035 07/04/16 0450  BP: (!) 150/80 (!) 144/52 (!) 127/40 (!) 136/47  Pulse: 76 66 65 61  Resp: 16 18 17 16   Temp:  97.9 F (36.6 C) 98.5 F (36.9 C) 97.4 F (36.3 C)  TempSrc:  Oral Oral Oral  SpO2: 99% 100% 98% 95%  Weight:    137 lb 1.6 oz (62.2 kg)  Height:        Intake/Output Summary (Last 24 hours) at 07/04/16 0809 Last data filed at 07/04/16 0450  Gross per 24 hour  Intake              510 ml  Output              525 ml  Net              -15 ml   Filed Weights   07/02/16 1201 07/03/16 0500 07/04/16 0450  Weight: 125 lb (56.7 kg) 125 lb 14.1 oz (57.1 kg) 137 lb 1.6 oz (62.2 kg)    Telemetry    Normal sinus rhythm without significant arrhythmia - Personally Reviewed   Physical Exam  My Exam Today: Vitals:   07/03/16 2035 07/04/16 0450  BP: (!) 127/40 (!) 136/47  Pulse: 65 61  Resp: 17 16  Temp: 98.5 F (36.9 C) 97.4 F (36.3 C)   Pt is alert and  oriented, NAD HEENT: normal Neck: JVP - normal, Right neck site with bandage in place and no signs of bleeding Lungs: CTA bilaterally CV: RRR with 2/6 systolic ejection murmur at the right upper sternal border, no diastolic murmur Abd: soft, NT, Positive BS, no hepatomegaly Ext: no C/C/E, distal pulses intact and equal, bilateral groin sites are clear and nontender Skin: warm/dry no rash     Labs    Chemistry Recent Labs Lab 06/28/16 1155  07/02/16 1450 07/02/16 1532 07/02/16 1650 07/04/16 0350  NA 134*  < > 138 138 138 135  K 4.7  < > 4.2 4.2 3.8 4.1  CL 97*  < > 98* 98*  --  100*  CO2 29  --   --   --   --  31  GLUCOSE 103*  < > 106* 113* 101* 100*  BUN 14  < > 12 11  --  15  CREATININE 0.71  < >  0.50* 0.40*  --  0.67  CALCIUM 9.5  --   --   --   --  8.6*  PROT 7.3  --   --   --   --   --   ALBUMIN 4.0  --   --   --   --   --   AST 20  --   --   --   --   --   ALT 12*  --   --   --   --   --   ALKPHOS 72  --   --   --   --   --   BILITOT 0.7  --   --   --   --   --   GFRNONAA >60  --   --   --   --  >60  GFRAA >60  --   --   --   --  >60  ANIONGAP 8  --   --   --   --  4*  < > = values in this interval not displayed.   Hematology Recent Labs Lab 06/28/16 1155  07/02/16 1650 07/02/16 1651 07/04/16 0350  WBC 8.4  --   --  6.7 8.2  RBC 4.75  --   --  4.13* 3.94*  HGB 14.4  < > 12.2* 12.3* 11.7*  HCT 45.3  < > 36.0* 38.3* 37.1*  MCV 95.4  --   --  92.7 94.2  MCH 30.3  --   --  29.8 29.7  MCHC 31.8  --   --  32.1 31.5  RDW 13.2  --   --  12.8 13.1  PLT 265  --   --  190 177  < > = values in this interval not displayed.  Cardiac EnzymesNo results for input(s): TROPONINI in the last 168 hours. No results for input(s): TROPIPOC in the last 168 hours.   BNPNo results for input(s): BNP, PROBNP in the last 168 hours.   DDimer No results for input(s): DDIMER in the last 168 hours.   Radiology    Dg Chest Port 1 View  Result Date: 07/03/2016 CLINICAL DATA:   Chest pain EXAM: PORTABLE CHEST 1 VIEW COMPARISON:  07/02/2016 FINDINGS: Cardiac shadow is mildly enlarged but stable. Aortic valve replacement is again seen and stable. The lungs are well aerated bilaterally. No focal infiltrate or sizable effusion is seen. No bony abnormality is noted. IMPRESSION: No acute abnormality seen. Electronically Signed   By: Inez Catalina M.D.   On: 07/03/2016 07:33   Dg Chest Port 1 View  Result Date: 07/02/2016 CLINICAL DATA:  81 y/o  M; atelectasis. EXAM: PORTABLE CHEST 1 VIEW COMPARISON:  06/28/2016 chest radiograph. FINDINGS: Stable mild cardiomegaly. Aortic valve replacement. Aortic atherosclerosis with calcification. No focal consolidation. No pleural effusion or pneumothorax identified. Bones are unremarkable. IMPRESSION: Stable cardiomegaly.  No acute pulmonary process identified. Electronically Signed   By: Kristine Garbe M.D.   On: 07/02/2016 18:09    Cardiac Studies   Postoperative echo is pending  Patient Profile     81 y.o. male with severe symptomatic aortic stenosis admitted for TAVR 07/02/2016  Assessment & Plan    1. Severe, stage D, aortic stenosis: Patient has progressed well with no early complications. I think he is stable for hospital discharge. He will continue on aspirin and Plavix. Will arrange and APP visit next week. 30 day TAVR visit with an echo is arranged. Post operative restrictions reviewed  with the patient.  2. Coronary artery disease, native vessel: Stable without angina. He continues on aspirin and Plavix.  3. Hypertension: Tolerating losartan and low-dose metoprolol.  4. Pericardial effusion: pt with a moderate effusion. I have reviewed today's echo and compared to the 6/19 echo and don't appreciate significant change. Will repeat an echo study next week when he returns for outpatient FU. Pt understands to seek immediate attention of he develops dyspnea or lightheadedness/syncope. Etiology of effusion is unclear, but a  small effusion dates back to last year on my review of echo images. The effusion enlarged significantly between the 05/01/16 and 07/02/16 studies. There is no clinical evidence of tamponade as the patient is asymptomatic with normal BP and heart rate.   Deatra James, MD  07/04/2016, 8:09 AM

## 2016-07-04 NOTE — Care Management Note (Signed)
Case Management Note Marvetta Gibbons RN, BSN Unit 2W-Case Manager-- [place] coverage [phone]  Patient Details  Name: [name] [name] MRN: [id number] Date of Birth: 06-07-1935  Subjective/Objective:   Pt admitted s/p TAVR 07/02/16                 Action/Plan: PTA pt lived at home alone- anticipate return home- CM to follow  Expected Discharge Date:  07/04/16               Expected Discharge Plan:  Home/Self Care  In-House Referral:     Discharge planning Services  CM Consult  Post Acute Care Choice:  Durable Medical Equipment Choice offered to:  Patient  DME Arranged:  Gilford Rile rolling DME Agency:  [place] Arranged:  NA [place] Agency:  NA  Status of Service:  Completed, signed off  If discussed at [place] of Stay Meetings, dates discussed:    Discharge Disposition: home/self care   Additional Comments:  07/04/16- 1020- Marvetta Gibbons RN, CM- per Cardiac rehab- pt would like RW for home- order placed- have notified Santiago Glad with [facility] for DME need- RW to be delivered to room prior to discharge.   Dawayne Patricia, RN 07/04/2016, 10:25 AM

## 2016-07-05 ENCOUNTER — Telehealth (HOSPITAL_COMMUNITY): Payer: Self-pay

## 2016-07-05 NOTE — Telephone Encounter (Signed)
Patient insurance is active and benefits verified. Patient has BCBS - no co-payment, deductible $1250/$1250 has been met, out of pocket $4350/$4350 has been met, no co-insurance, no pre-authorization and no limit on visit. Passport/reference [id number].  Patient will be contacted and scheduled after their follow up appointment with the cardiologist on 07/11/16 and surgeon on 07/25/16, upon review by [facility] RN navigator.

## 2016-07-08 NOTE — Progress Notes (Signed)
Cardiology Office Note    Date:  07/11/2016   ID:  [name] [name], DOB 02/25/1935, MRN [id number]  PCP:  Maury Dus, MD  Cardiologist:  Dr. Gale Journey: Dr. Burt Knack  Chief Complaint: 1 week TAVR follow up  History of Present Illness:   [name] [name] is a 81 y.o. male severe AS s/p TVAR, CAD, HTN, HLD, MVP with mild MR present for follow up.   Hx of bicuspid aortic valve with aortic stenosis as well as MVP with mild MR, osteoarthritis of the L hip, HTN, HL.  He was evaluated by Dr. [name] in 04/2016 for severe aortic stenosis.  LHC on 05/29/16 demonstrated nonobstructive disease in LAD and ramus intermediate. He had hemodynamically significant mid RCA stenosis by FFR. He returned to the hospital on 06/06/16 for planned PCI of the RCA. RCA was treated successfully with DES. He was discharged later that day (same day DC). Of note, CT angiogram of the abdomen and pelvis in 05/2016 didn't demonstrate chronic bladder outlet obstruction including a thickened and heavily trabeculated wall with multiple small diverticuli.   Patient underwent successful TVAR 07/02/16. She tolerated well and was taken to the surgical intensive care unit in stable condition. Post TVAR echo 6/21 showed persistent moderate pericardial effusion from 07/02/16 (at day for [facility]). The effusion enlarged significantly between the 05/01/16 and 07/02/16 studies. There is no clinical evidence of tamponade as the patient is asymptomatic with normal BP and heart rate.   Echo 07/09/16 Showed normal LV function of 60-65%, mild LVH, normal functioning aortic wall, persistent moderate pericardial effusion without evidence of tamponade.  Here today for follow up. Feeling well. Denies dizziness, palpitations, shortness of breath, orthopnea, PND, lower extremity edema, melena or blood in his stool or urine. Compliant with the medication. Has multiple bruises on Plavix. He is planning for left hip replacement once cleared by Dr. Burt Knack  after few months.    Past Medical History:  Diagnosis Date  . Actinic keratoses    Dr Glee Arvin  . Allergic rhinitis   . Aortic stenosis   . Arthritis   . Benign essential HTN   . Bicuspid aortic valve   . Bilateral cataracts   . BPH (benign prostatic hypertrophy)   . Coronary artery disease involving native coronary artery of native heart without angina pectoris    PCI 06/06/16 LAD proximal 60 (FFR 0.85-negative); RI 50; LCx calcified without significant stenosis; RCA mid 80 (FFR 0.68-hemodynamically significant); PCI: 2.25 x 16 mm Synergy DES to the mid RCA  . Dilated aortic root ([place])    14mm by echo 08/2015  . Glaucoma   . H/O seasonal allergies   . Hx of adenomatous colonic polyps 1991 on  . Hx of cardiovascular stress test    Myoview (10/15):  normal  . Hyperlipidemia   . Mitral valve prolapse    mild MR by echo 08/2015  . Paresthesia    Chronic left lateral thigh    Past Surgical History:  Procedure Laterality Date  . COLONOSCOPY  multiple  . CORONARY STENT INTERVENTION N/A 06/06/2016   Procedure: Coronary Stent Intervention;  Surgeon: [name], MD;  Location: [place] [facility];  Service: Cardiovascular;  Laterality: N/A;  . EYE SURGERY     Bilateral cataract removal  . INTRAVASCULAR PRESSURE WIRE/FFR STUDY N/A 05/29/2016   Procedure: Intravascular Pressure Wire/FFR Study;  Surgeon: [name], MD;  Location: [place] [facility];  Service: Cardiovascular;  Laterality: N/A;  . RIGHT/LEFT HEART  CATH AND CORONARY ANGIOGRAPHY N/A 05/29/2016   Procedure: Right/Left Heart Cath and Coronary Angiography;  Surgeon: [name], MD;  Location: [place] [facility];  Service: Cardiovascular;  Laterality: N/A;  . TEE WITHOUT CARDIOVERSION N/A 07/02/2016   Procedure: TRANSESOPHAGEAL ECHOCARDIOGRAM (TEE);  Surgeon: [name], MD;  Location: [place];  Service: Open Heart Surgery;  Laterality: N/A;  . TONSILLECTOMY  1949  . TRANSCATHETER AORTIC VALVE REPLACEMENT,  TRANSFEMORAL N/A 07/02/2016   Procedure: TRANSCATHETER AORTIC VALVE REPLACEMENT, TRANSFEMORAL;  Surgeon: [name], MD;  Location: [place];  Service: Open Heart Surgery;  Laterality: N/A;    Current Medications: Prior to Admission medications   Medication Sig Start Date End Date Taking? Authorizing Provider  acetaminophen (TYLENOL) 500 MG tablet Take 1 tablet (500 mg total) by mouth every 6 (six) hours as needed for moderate pain. Patient taking differently: Take 1,000 mg by mouth 2 (two) times daily as needed (for pain.).  06/06/16   Cheryln Manly, NP  acetaminophen-codeine (TYLENOL #3) 300-30 MG tablet Take 1-2 tablets by mouth every 8 (eight) hours as needed for moderate pain. 07/04/16   Gold, Wilder Glade, PA-C  Ascorbic Acid (VITAMIN C) 1000 MG tablet Take 1,000 mg by mouth at bedtime.     [provider]  aspirin EC 81 MG tablet Take 81 mg by mouth daily.    [provider]  cetaphil (CETAPHIL) cream Apply 1 application topically daily.    [provider]  clopidogrel (PLAVIX) 75 MG tablet Take 1 tablet (75 mg total) by mouth daily. 06/03/16 06/03/17  [name], MD  Glucosamine-Chondroit-Vit C-Mn (GLUCOSAMINE CHONDR 1500 COMPLX PO) Take 1 tablet by mouth daily.    [provider]  latanoprost (XALATAN) 0.005 % ophthalmic solution Place 1 drop into both eyes at bedtime.    [provider]  losartan (COZAAR) 100 MG tablet Take 100 mg by mouth daily.    [provider]  metoprolol tartrate (LOPRESSOR) 25 MG tablet Take 0.5 tablets (12.5 mg total) by mouth 2 (two) times daily. 07/04/16   Gold, Wilder Glade, PA-C  Omega-3 Fatty Acids (FISH OIL) 500 MG CAPS Take 500 mg by mouth at bedtime.     [provider]  pravastatin (PRAVACHOL) 40 MG tablet Take 40 mg by mouth every evening.  06/03/11   [provider]  timolol (BETIMOL) 0.5 % ophthalmic solution Place 1 drop into both eyes 2 (two) times daily.    [provider]     Allergies:   No known allergies   Social History   Social History  . Marital status: Widowed    Spouse name: N/A  . Number of children: N/A  . Years of education: N/A   Social History Main Topics  . Smoking status: Former Smoker    Quit date: 07/14/1960  . Smokeless tobacco: Never Used  . Alcohol use 3.0 oz/week    5 Cans of beer per week  . Drug use: No  . Sexual activity: Not Asked   Other Topics Concern  . None   Social History Narrative  . None     Family History:  The patient's family history includes Emphysema in his mother; Heart attack in his brother; Heart disease in his brother; Pancreatic cancer in his father.   ROS:   Please see the history of present illness.    ROS All other systems reviewed and are negative.   PHYSICAL EXAM:   VS:  BP 140/72   Pulse 60  Ht 5\' 9"  (1.753 m)   Wt 131 lb (59.4 kg)   BMI 19.35 kg/m    GEN: Well nourished, well developed, in no acute distress  HEENT: normal  Neck: no JVD, carotid bruits, or masses Cardiac: RRR; no murmurs, rubs, or gallops,no edema  Respiratory:  clear to auscultation bilaterally, normal work of breathing GI: soft, nontender, nondistended, + BS MS: no deformity or atrophy  Skin: warm and dry, no rash Neuro:  Alert and Oriented x 3, Strength and sensation are intact Psych: euthymic mood, full affect  Wt Readings from Last 3 Encounters:  07/11/16 131 lb (59.4 kg)  07/04/16 137 lb 1.6 oz (62.2 kg)  06/28/16 127 lb 8 oz (57.8 kg)     Studies/Labs Reviewed:   EKG:  EKG is not ordered today.   Recent Labs: 06/28/2016: ALT 12 07/04/2016: BUN 15; Creatinine, Ser 0.67; Hemoglobin 11.7; Platelets 177; Potassium 4.1; Sodium 135   Lipid Panel No results found for: CHOL, TRIG, HDL, CHOLHDL, VLDL, LDLCALC, LDLDIRECT  Additional studies/ records that were reviewed today include:    Transcatheter Aortic Valve Replacement - Percutaneous  Transfemoral Approach 07/03/15             Edwards Sapien 3 THV  (size 29 mm, model # 9600TFX, serial # [id number])  Echo 07/04/16 Study Conclusions  - Left ventricle: The cavity size was normal. Systolic function was   normal. The estimated ejection fraction was in the range of 60%   to 65%. Wall motion was normal; there were no regional wall   motion abnormalities. Doppler parameters are consistent with   abnormal left ventricular relaxation (grade 1 diastolic   dysfunction). Doppler parameters are consistent with high   ventricular filling pressure. - Aortic valve: S/P TAVR with stable bioprosthetic AVR well seated.   There is trivial perivalvular AR. The peak AV gradient is 58mmHg   and mean AV gradient is 68mmHg. The calculated AVA using VTI is   1.94cm2. There was mild regurgitation. Valve area (VTI): 1.94   cm^2. Valve area (Vmax): 1.7 cm^2. Valve area (Vmean): 1.8 cm^2. - Pulmonic valve: There was mild regurgitation. - Pulmonary arteries: PA peak pressure: 32 mm Hg (S). - Pericardium, extracardiac: A moderate to large, free-flowing   pericardial effusion was identified circumferential to the heart.   The fluid had no internal echoes. The largest diameter is 3cm   over the RA. There was mildright ventricular chamber collapse for   less than 50% of the cardiac cycle. There was moderate right   atrial chamber collapse for more than 50% of the cardiac cycle.   The IVC is normal caliber and collapses normally. Respirophasic   change in stroke volume was normal.  Impressions:  - Compared to prior echo, the effusion appears to have increased   some in size.  Echo 07/09/16 Study Conclusions  - Left ventricle: The cavity size was normal. Wall thickness was   increased in a pattern of mild LVH. Systolic function was normal.   The estimated ejection fraction was in the range of 60% to 65%.   Wall motion was normal; there were no regional wall motion   abnormalities. Doppler parameters are consistent with abnormal   left ventricular relaxation  (grade 1 diastolic dysfunction). - Aortic valve: Bioprosthetic aortic valve s/p TAVR. Normally   functioning bioprosthetic valve without significant regurgitation   or stenosis. Mean gradient (S): 9 mm Hg. Valve area (VTI): 1.88   cm^2. - Mitral valve: There was trivial regurgitation. -  Right ventricle: The cavity size was normal. Systolic function   was normal. - Tricuspid valve: Peak RV-RA gradient (S): 28 mm Hg. - Pulmonary arteries: PA peak pressure: 31 mm Hg (S). - Inferior vena cava: The vessel was normal in size. The   respirophasic diameter changes were in the normal range (>= 50%),   consistent with normal central venous pressure. - Pericardium, extracardiac: Moderate primarily posterior   pericardial effusion with no tamponade.  Impressions:  - Normal LV size with mild LV hypertrophy. EF 60-65%. Normal RV   size and systolic function. Bioprosthetic aortic valve s/p TAVR,   the valve appears to be functioning normally. There is a moderate   pericardial effusion, primarily posteriorly. No evidence of   tamponade. This has been seen on prior studies.  ASSESSMENT & PLAN:    1. S/p TVAR - Normal functioning valve on echocardiogram 07/09/16. No dyspnea, dizziness or syncope. Follow with Dr. Burt Knack with echocardiogram as scheduled next month.  2. CAD s/p DES to RCA - No angina or dyspnea. Continue aspirin, Plavix and statin.  3. Pericardiac effusion - Persistent moderate effusion on recent echocardiogram. No evidence of tamponade. Follow clinically.  4. HTN - Stable on losartan. He has not started metoprolol 12.5mg  BID. HR of 60. Advised to continue to hold.    Medication Adjustments/Labs and Tests Ordered: Current medicines are reviewed at length with the patient today.  Concerns regarding medicines are outlined above.  Medication changes, Labs and Tests ordered today are listed in the Patient Instructions below. Patient Instructions  Your physician recommends that  you continue on your current medications as directed. Please refer to the Current Medication list given to you today.  Your physician recommends that you schedule a follow-up appointment in: [place] Mon[place] Fam, [place]  07/11/2016 11:23 AM    [address], [place], [place]  [address] Phone: [phone]; Fax: [phone]

## 2016-07-09 ENCOUNTER — Other Ambulatory Visit: Payer: Self-pay

## 2016-07-09 ENCOUNTER — Ambulatory Visit (HOSPITAL_COMMUNITY): Payer: BC Managed Care – PPO | Attending: Cardiology

## 2016-07-09 DIAGNOSIS — I3139 Other pericardial effusion (noninflammatory): Secondary | ICD-10-CM

## 2016-07-09 DIAGNOSIS — Z952 Presence of prosthetic heart valve: Secondary | ICD-10-CM

## 2016-07-09 DIAGNOSIS — I313 Pericardial effusion (noninflammatory): Secondary | ICD-10-CM | POA: Insufficient documentation

## 2016-07-09 DIAGNOSIS — E785 Hyperlipidemia, unspecified: Secondary | ICD-10-CM | POA: Diagnosis not present

## 2016-07-09 DIAGNOSIS — I10 Essential (primary) hypertension: Secondary | ICD-10-CM | POA: Diagnosis not present

## 2016-07-09 DIAGNOSIS — I2510 Atherosclerotic heart disease of native coronary artery without angina pectoris: Secondary | ICD-10-CM | POA: Insufficient documentation

## 2016-07-09 DIAGNOSIS — I350 Nonrheumatic aortic (valve) stenosis: Secondary | ICD-10-CM

## 2016-07-09 DIAGNOSIS — Z953 Presence of xenogenic heart valve: Secondary | ICD-10-CM | POA: Insufficient documentation

## 2016-07-11 ENCOUNTER — Encounter: Payer: Self-pay | Admitting: Physician Assistant

## 2016-07-11 ENCOUNTER — Ambulatory Visit (INDEPENDENT_AMBULATORY_CARE_PROVIDER_SITE_OTHER): Payer: BC Managed Care – PPO | Admitting: Physician Assistant

## 2016-07-11 VITALS — BP 140/72 | HR 60 | Ht 69.0 in | Wt 131.0 lb

## 2016-07-11 DIAGNOSIS — I313 Pericardial effusion (noninflammatory): Secondary | ICD-10-CM | POA: Diagnosis not present

## 2016-07-11 DIAGNOSIS — I2510 Atherosclerotic heart disease of native coronary artery without angina pectoris: Secondary | ICD-10-CM

## 2016-07-11 DIAGNOSIS — I10 Essential (primary) hypertension: Secondary | ICD-10-CM | POA: Diagnosis not present

## 2016-07-11 DIAGNOSIS — Z952 Presence of prosthetic heart valve: Secondary | ICD-10-CM | POA: Diagnosis not present

## 2016-07-11 DIAGNOSIS — I3139 Other pericardial effusion (noninflammatory): Secondary | ICD-10-CM

## 2016-07-11 NOTE — Patient Instructions (Signed)
Your physician recommends that you continue on your current medications as directed. Please refer to the Current Medication list given to you today.   Your physician recommends that you schedule a follow-up appointment in: [facility]  

## 2016-07-16 ENCOUNTER — Telehealth (HOSPITAL_COMMUNITY): Payer: Self-pay | Admitting: Cardiac Rehabilitation

## 2016-07-16 NOTE — Telephone Encounter (Signed)
-----   Message from [name], MD sent at 07/15/2016  5:20 PM EDT ----- Regarding: RE: cardiac rehab  OK to begin rehab. He is extremely limited from end-stage hip arthritis just FYI ----- Message ----- From: Leanor Kail, PA Sent: 07/15/2016  10:28 AM To: [name], MD Subject: Melton Alar: cardiac rehab                              Can you review this patient? Is to ok to restart rehab? Persistent moderate pericardiac effusion. He was doing fine when I saw him last week.   Thanks    ----- Message ----- From: Lowell Guitar, RN Sent: 07/12/2016   4:17 PM To: Leanor Kail, [place] Subject: cardiac rehab                                  Dear Cephus Shelling,  Upon review of your 07/11/2016 office note, pt has persistent pericardial effusion.  He is asymptomatic.  Is it ok for him to begin cardiac rehab?  Thank you, Andi Hence, RN, BSN Cardiac Pulmonary Rehab

## 2016-07-19 ENCOUNTER — Telehealth (HOSPITAL_COMMUNITY): Payer: Self-pay

## 2016-07-19 NOTE — Telephone Encounter (Signed)
I called and left message on patient voicemail to call office about scheduling for cardiac rehab. I left office contact information on patient voicemail to return call.  ° °

## 2016-07-25 ENCOUNTER — Ambulatory Visit (INDEPENDENT_AMBULATORY_CARE_PROVIDER_SITE_OTHER): Payer: BC Managed Care – PPO | Admitting: Cardiovascular Disease

## 2016-07-25 ENCOUNTER — Encounter: Payer: Self-pay | Admitting: Cardiovascular Disease

## 2016-07-25 ENCOUNTER — Other Ambulatory Visit: Payer: Self-pay

## 2016-07-25 ENCOUNTER — Ambulatory Visit (HOSPITAL_COMMUNITY): Payer: BC Managed Care – PPO | Attending: Cardiology

## 2016-07-25 VITALS — BP 130/72 | HR 58 | Ht 69.0 in | Wt 128.1 lb

## 2016-07-25 DIAGNOSIS — Z952 Presence of prosthetic heart valve: Secondary | ICD-10-CM

## 2016-07-25 DIAGNOSIS — I10 Essential (primary) hypertension: Secondary | ICD-10-CM | POA: Insufficient documentation

## 2016-07-25 DIAGNOSIS — I071 Rheumatic tricuspid insufficiency: Secondary | ICD-10-CM | POA: Insufficient documentation

## 2016-07-25 DIAGNOSIS — I350 Nonrheumatic aortic (valve) stenosis: Secondary | ICD-10-CM | POA: Diagnosis present

## 2016-07-25 DIAGNOSIS — E785 Hyperlipidemia, unspecified: Secondary | ICD-10-CM | POA: Insufficient documentation

## 2016-07-25 DIAGNOSIS — I341 Nonrheumatic mitral (valve) prolapse: Secondary | ICD-10-CM | POA: Insufficient documentation

## 2016-07-25 DIAGNOSIS — I2510 Atherosclerotic heart disease of native coronary artery without angina pectoris: Secondary | ICD-10-CM | POA: Diagnosis not present

## 2016-07-25 DIAGNOSIS — I313 Pericardial effusion (noninflammatory): Secondary | ICD-10-CM | POA: Insufficient documentation

## 2016-07-25 MED ORDER — AMOXICILLIN 500 MG PO TABS: ORAL_TABLET | ORAL | 2 refills | Status: DC

## 2016-07-25 MED ORDER — CLOPIDOGREL BISULFATE 75 MG PO TABS: 75.0000 mg | ORAL_TABLET | Freq: Every day | ORAL | 11 refills | Status: DC

## 2016-07-25 NOTE — Progress Notes (Signed)
Cardiology Office Note Date:  07/25/2016   ID:  [name] [name], DOB [birth year], MRN [id number]  PCP:  Maury Dus, MD  Cardiologist:  [name], MD    Chief Complaint  Patient presents with  . Follow-up    Post-TAVR     History of Present Illness: [name] [name] is a 81 y.o. male who presents for follow-up after undergoing TAVR 07/02/2016. The patient developed severe symptomatic aortic stenosis and was treated with TAVR using a 29 mm transcatheter heart valve via a percutaneous transfemoral approach. The patient was also noted to have a moderate pericardial effusion at the time of the procedure without significant change on the postoperative day #1 echo. His post operative course was uncomplicated and he returns today for follow-up evaluation.  The patient is doing well. His energy level is improved. He denies shortness of breath with activity. He denies chest pain or pressure. He remains severely limited from end-stage left hip arthritis. He walks with a cane.   Past Medical History:  Diagnosis Date  . Actinic keratoses    Dr Glee Arvin  . Allergic rhinitis   . Aortic stenosis   . Arthritis   . Benign essential HTN   . Bicuspid aortic valve   . Bilateral cataracts   . BPH (benign prostatic hypertrophy)   . Coronary artery disease involving native coronary artery of native heart without angina pectoris    PCI 06/06/16 LAD proximal 60 (FFR 0.85-negative); RI 50; LCx calcified without significant stenosis; RCA mid 80 (FFR 0.68-hemodynamically significant); PCI: 2.25 x 16 mm Synergy DES to the mid RCA  . Dilated aortic root ([place])    46mm by echo 08/2015  . Glaucoma   . H/O seasonal allergies   . Hx of adenomatous colonic polyps 1991 on  . Hx of cardiovascular stress test    Myoview (10/15):  normal  . Hyperlipidemia   . Mitral valve prolapse    mild MR by echo 08/2015  . Paresthesia    Chronic left lateral thigh    Past Surgical History:  Procedure Laterality  Date  . COLONOSCOPY  multiple  . CORONARY STENT INTERVENTION N/A 06/06/2016   Procedure: Coronary Stent Intervention;  Surgeon: [name], MD;  Location: [facility];  Service: Cardiovascular;  Laterality: N/A;  . EYE SURGERY     Bilateral cataract removal  . INTRAVASCULAR PRESSURE WIRE/FFR STUDY N/A 05/29/2016   Procedure: Intravascular Pressure Wire/FFR Study;  Surgeon: [name], MD;  Location: [facility];  Service: Cardiovascular;  Laterality: N/A;  . RIGHT/LEFT HEART CATH AND CORONARY ANGIOGRAPHY N/A 05/29/2016   Procedure: Right/Left Heart Cath and Coronary Angiography;  Surgeon: [name], MD;  Location: [facility];  Service: Cardiovascular;  Laterality: N/A;  . TEE WITHOUT CARDIOVERSION N/A 07/02/2016   Procedure: TRANSESOPHAGEAL ECHOCARDIOGRAM (TEE);  Surgeon: [name], MD;  Location: [facility];  Service: Open Heart Surgery;  Laterality: N/A;  . TONSILLECTOMY  1949  . TRANSCATHETER AORTIC VALVE REPLACEMENT, TRANSFEMORAL N/A 07/02/2016   Procedure: TRANSCATHETER AORTIC VALVE REPLACEMENT, TRANSFEMORAL;  Surgeon: [name], MD;  Location: [facility];  Service: Open Heart Surgery;  Laterality: N/A;    Current Outpatient Prescriptions  Medication Sig Dispense Refill  . acetaminophen (TYLENOL) 500 MG tablet Take 1 tablet (500 mg total) by mouth every 6 (six) hours as needed for moderate pain. (Patient taking differently: Take 1,000 mg by mouth 2 (two) times daily as needed (for pain.). ) 30 tablet 0  .  acetaminophen-codeine (TYLENOL #3) 300-30 MG tablet Take 1-2 tablets by mouth every 8 (eight) hours as needed for moderate pain. 20 tablet 0  . Ascorbic Acid (VITAMIN C) 1000 MG tablet Take 1,000 mg by mouth at bedtime.     Marland Kitchen aspirin EC 81 MG tablet Take 81 mg by mouth daily.    . cetaphil (CETAPHIL) cream Apply 1 application topically daily.    . clopidogrel (PLAVIX) 75 MG tablet Take 1 tablet (75 mg total) by mouth daily. 30 tablet 11  .  Glucosamine-Chondroit-Vit C-Mn (GLUCOSAMINE CHONDR 1500 COMPLX PO) Take 1 tablet by mouth daily.    Marland Kitchen latanoprost (XALATAN) 0.005 % ophthalmic solution Place 1 drop into both eyes at bedtime.    Marland Kitchen losartan (COZAAR) 100 MG tablet Take 100 mg by mouth daily.    . Omega-3 Fatty Acids (FISH OIL) 500 MG CAPS Take 500 mg by mouth at bedtime.     . pravastatin (PRAVACHOL) 40 MG tablet Take 40 mg by mouth every evening.     . timolol (BETIMOL) 0.5 % ophthalmic solution Place 1 drop into both eyes 2 (two) times daily.    Marland Kitchen amoxicillin (AMOXIL) 500 MG tablet Take 4 tablets by mouth one hour prior to dental appointment 8 tablet 2   No current facility-administered medications for this visit.     Allergies:   No known allergies   Social History:  The patient  reports that he quit smoking about 56 years ago. He has never used smokeless tobacco. He reports that he drinks about 3.0 oz of alcohol per week . He reports that he does not use drugs.   Family History:  The patient's family history includes Emphysema in his mother; Heart attack in his brother; Heart disease in his brother; Pancreatic cancer in his father.   ROS:  Please see the history of present illness.  Otherwise, review of systems is positive for weight loss.  All other systems are reviewed and negative.   PHYSICAL EXAM: VS:  BP 130/72   Pulse (!) 58   Ht 5\' 9"  (1.753 m)   Wt 128 lb 1.9 oz (58.1 kg)   BMI 18.92 kg/m  , BMI Body mass index is 18.92 kg/m. GEN: Frail, elderly man, in no acute distress  HEENT: normal  Neck: no JVD, no masses. No carotid bruits Cardiac: RRR without murmur or gallop                Respiratory:  clear to auscultation bilaterally, normal work of breathing GI: soft, nontender, nondistended, + BS MS: no deformity or atrophy  Ext: no pretibial edema, pedal pulses 2+= bilaterally Skin: warm and dry, no rash Neuro:  Strength and sensation are intact Psych: euthymic mood, full affect  EKG:  EKG is ordered  today. The ekg ordered today shows sinus brady 58 bpm, left anterior fascicular block  Recent Labs: 06/28/2016: ALT 12 07/04/2016: BUN 15; Creatinine, Ser 0.67; Hemoglobin 11.7; Platelets 177; Potassium 4.1; Sodium 135   Lipid Panel  No results found for: CHOL, TRIG, HDL, CHOLHDL, VLDL, LDLCALC, LDLDIRECT    Wt Readings from Last 3 Encounters:  07/25/16 128 lb 1.9 oz (58.1 kg)  07/11/16 131 lb (59.4 kg)  07/04/16 137 lb 1.6 oz (62.2 kg)     Cardiac Studies Reviewed: Today's echo is reviewed. LV function appears normal. Peak and mean transaortic valvular gradients are 8 and 4 mmHg, respectively. There is a moderate pericardial effusion without significant change by my assessment. Formal interpretation is  pending.  ASSESSMENT AND PLAN: 1.  Aortic valve disease status post TAVR: The patient is doing well with New York Heart Association functional class I symptoms of chronic diastolic heart failure. He is primarily limited by his hip problem. He should remain on dual antiplatelet therapy with aspirin and clopidogrel. I recommended follow-up with Dr. Radford Pax in 3 months. I will see him back in one year for his valve clinic visit.  2. Pericardial effusion: Etiology unclear. He's had a fairly long-standing small effusion that increased between April and June 2018. I don't appreciate any major change over the last month. I carefully measured his blood pressure today and there is no pulsus paradox present. The patient is completely asymptomatic with no dizziness, lightheadedness, or shortness of breath. Would continue to follow at this time.  3. Coronary artery disease, native vessel, without angina: The patient is doing well after undergoing PCI of the right coronary artery in May. He will require hip surgery and he is severely limited. I think it would be reasonable to proceed with hip replacement after 3 months of dual antiplatelet therapy since his stent was implanted electively. If possible it  would be best for him to remain on low-dose aspirin without interruption in the perioperative period.  Current medicines are reviewed with the patient today.  The patient does not have concerns regarding medicines.  Labs/ tests ordered today include:   Orders Placed This Encounter  Procedures  . EKG 12-Lead    Disposition:   FU 3 months with Dr Marcy Siren, [name], MD  07/25/2016 1:28 PM    [facility] [place], [place], [place]  [address] Phone: [phone]; Fax: [phone]

## 2016-07-25 NOTE — Patient Instructions (Addendum)
Medication Instructions:  Your physician recommends that you continue on your current medications as directed. Please refer to the Current Medication list given to you today.  Labwork: No new orders.   Testing/Procedures: No new orders.  Follow-Up: Your physician recommends that you schedule a follow-up appointment in: 3 MONTHS with Dr Radford Pax  Your physician wants you to follow-up in: 1 YEAR with Dr Burt Knack. You will receive a reminder letter in the mail two months in advance. If you don't receive a letter, please call our office to schedule the follow-up appointment.  Any Other Special Instructions Will Be Listed Below (If Applicable).  You are cleared to proceed with hip surgery after 09/14/16.   Your physician discussed the importance of taking an antibiotic prior to any dental, gastrointestinal, genitourinary procedures to prevent damage to the heart valves from infection. You were given a prescription for an antibiotic based on current SBE prophylaxis guidelines.   If you need a refill on your cardiac medications before your next appointment, please call your pharmacy.

## 2016-07-30 ENCOUNTER — Telehealth: Payer: Self-pay

## 2016-07-30 DIAGNOSIS — I3139 Other pericardial effusion (noninflammatory): Secondary | ICD-10-CM

## 2016-07-30 DIAGNOSIS — I313 Pericardial effusion (noninflammatory): Secondary | ICD-10-CM

## 2016-07-30 NOTE — Telephone Encounter (Signed)
Repeat 2D echo in 2 months

## 2016-07-30 NOTE — Telephone Encounter (Signed)
-----   Message from Sueanne Margarita, MD sent at 07/26/2016  5:37 PM EDT ----- Also please find out if he has had a recent colonoscopy  Traci ----- Message ----- From: [name], MD Sent: 07/25/2016  11:27 AM To: Sueanne Margarita, MD  Traci - if you have a chance can you take a look at today's echo. Valve looks great. Pericardial effusion is unchanged but is at least moderate. He's totally asymptomatic. I thought we should just observe him. What do you think?

## 2016-07-30 NOTE — Telephone Encounter (Signed)
-----   Message from Sueanne Margarita, MD sent at 07/26/2016  5:35 PM EDT ----- [name] [name],  Patient has persistent pericardial effusion of ? Etiology.  In looking for possible causes I ran across his Abd pelvic CT from May prior to TAVR showing a worrisome abnormality in bladder read out as possible neoplasm and recommended semi urgent workup.  Scott told the patient to followup with his Urologist but not sure this was done. Could you please call Dr. Rosana Hoes office with Urology to find out if this was followed up on?  Traci  ----- Message ----- From: [name], MD Sent: 07/25/2016  11:27 AM To: Sueanne Margarita, MD  Traci - if you have a chance can you take a look at today's echo. Valve looks great. Pericardial effusion is unchanged but is at least moderate. He's totally asymptomatic. I thought we should just observe him. What do you think?

## 2016-07-30 NOTE — Telephone Encounter (Signed)
Spoke with RN at Dr. Rosana Hoes' office. Brought attention to CT in Care Everywhere. She will review results with provider and will have scheduling call to arrange appointment with patient as he did not follow up as instructed.   Spoke with patient. Informed him Urology will be in touch to arrange appointment. He states he had a colonoscopy 3 years ago with no follow-up plans.

## 2016-07-30 NOTE — Telephone Encounter (Signed)
Left message for RN to call back.

## 2016-07-31 NOTE — Telephone Encounter (Signed)
ECHO ordered to be scheduled in 2 months. Patient agrees with treatment plan.

## 2016-07-31 NOTE — Telephone Encounter (Signed)
ECHO has been scheduled 9/13.

## 2016-08-12 HISTORY — PX: CYSTOSCOPY: SUR368

## 2016-09-24 ENCOUNTER — Other Ambulatory Visit (HOSPITAL_COMMUNITY): Payer: BC Managed Care – PPO

## 2016-09-26 ENCOUNTER — Ambulatory Visit (HOSPITAL_COMMUNITY): Payer: BC Managed Care – PPO | Attending: Cardiovascular Disease

## 2016-09-26 ENCOUNTER — Other Ambulatory Visit: Payer: Self-pay

## 2016-09-26 DIAGNOSIS — I071 Rheumatic tricuspid insufficiency: Secondary | ICD-10-CM | POA: Insufficient documentation

## 2016-09-26 DIAGNOSIS — I3139 Other pericardial effusion (noninflammatory): Secondary | ICD-10-CM

## 2016-09-26 DIAGNOSIS — I10 Essential (primary) hypertension: Secondary | ICD-10-CM | POA: Diagnosis not present

## 2016-09-26 DIAGNOSIS — I2510 Atherosclerotic heart disease of native coronary artery without angina pectoris: Secondary | ICD-10-CM | POA: Diagnosis not present

## 2016-09-26 DIAGNOSIS — I371 Nonrheumatic pulmonary valve insufficiency: Secondary | ICD-10-CM | POA: Insufficient documentation

## 2016-09-26 DIAGNOSIS — I313 Pericardial effusion (noninflammatory): Secondary | ICD-10-CM | POA: Insufficient documentation

## 2016-09-27 ENCOUNTER — Telehealth: Payer: Self-pay | Admitting: *Deleted

## 2016-09-27 DIAGNOSIS — Z952 Presence of prosthetic heart valve: Secondary | ICD-10-CM

## 2016-09-27 NOTE — Telephone Encounter (Signed)
Notes recorded by Sueanne Margarita, MD on 09/26/2016 at 2:01 PM EDT Echo showed mild LVE with low normal LVF, stable TARV and moderate posterior PE with no change from July. Repeat echo in 2 months

## 2016-10-03 ENCOUNTER — Ambulatory Visit: Payer: Self-pay | Admitting: Orthopedic Surgery

## 2016-10-03 NOTE — Progress Notes (Signed)
Need orders in epic.  Preop on 9/25.

## 2016-10-07 ENCOUNTER — Other Ambulatory Visit (HOSPITAL_COMMUNITY): Payer: Self-pay | Admitting: Emergency Medicine

## 2016-10-07 NOTE — Progress Notes (Addendum)
LOV/ cardiology clearance Dr Burt Knack 07-25-16 epic   EKG 07-25-16 epic  ECHO 09-27-16 epic R/L cardiac cath with angiography 05-29-16 epic AORTIC VALVE REPLACEMENT 07-02-16 EPIC coronary stent intervention 06-06-16 epic  CXR 07-03-16 epic

## 2016-10-07 NOTE — Patient Instructions (Signed)
[name] [name]  10/07/2016   Your procedure is scheduled on: 10-16-16  Report to [facility] Main  Entrance     Report to admitting at 6AM   Call this number if you have problems the morning of surgery  [phone]   Remember: ONLY 1 PERSON MAY GO WITH YOU TO SHORT STAY TO GET  READY MORNING OF YOUR SURGERY.  Do not eat food or drink liquids :After Midnight.     Take these medicines the morning of surgery with A SIP OF WATER: tylenol as needed, eye drops, nasal spray                                 You may not have any metal on your body including hair pins and              piercings  Do not wear jewelry, make-up, lotions, powders or perfumes, deodorant              Men may shave face and neck.   Do not bring valuables to the hospital. [place].  Contacts, dentures or bridgework may not be worn into surgery.  Leave suitcase in the car. After surgery it may be brought to your room.                Please read over the following fact sheets you were given: _____________________________________________________________________             [facility] - Preparing for Surgery Before surgery, you can play an important role.  Because skin is not sterile, your skin needs to be as free of germs as possible.  You can reduce the number of germs on your skin by washing with CHG (chlorahexidine gluconate) soap before surgery.  CHG is an antiseptic cleaner which kills germs and bonds with the skin to continue killing germs even after washing. Please DO NOT use if you have an allergy to CHG or antibacterial soaps.  If your skin becomes reddened/irritated stop using the CHG and inform your nurse when you arrive at Short Stay. Do not shave (including legs and underarms) for at least 48 hours prior to the first CHG shower.  You may shave your face/neck. Please follow these instructions carefully:  1.  Shower with CHG Soap  the night before surgery and the  morning of Surgery.  2.  If you choose to wash your hair, wash your hair first as usual with your  normal  shampoo.  3.  After you shampoo, rinse your hair and body thoroughly to remove the  shampoo.                           4.  Use CHG as you would any other liquid soap.  You can apply chg directly  to the skin and wash                       Gently with a scrungie or clean washcloth.  5.  Apply the CHG Soap to your body ONLY FROM THE NECK DOWN.   Do not use on face/ open  Wound or open sores. Avoid contact with eyes, ears mouth and genitals (private parts).                       Wash face,  Genitals (private parts) with your normal soap.             6.  Wash thoroughly, paying special attention to the area where your surgery  will be performed.  7.  Thoroughly rinse your body with warm water from the neck down.  8.  DO NOT shower/wash with your normal soap after using and rinsing off  the CHG Soap.                9.  Pat yourself dry with a clean towel.            10.  Wear clean pajamas.            11.  Place clean sheets on your bed the night of your first shower and do not  sleep with pets. Day of Surgery : Do not apply any lotions/deodorants the morning of surgery.  Please wear clean clothes to the hospital/surgery center.  FAILURE TO FOLLOW THESE INSTRUCTIONS MAY RESULT IN THE CANCELLATION OF YOUR SURGERY PATIENT SIGNATURE_________________________________  NURSE SIGNATURE__________________________________  ________________________________________________________________________   [name] [name]  An incentive spirometer is a tool that can help keep your lungs clear and active. This tool measures how well you are filling your lungs with each breath. Taking long deep breaths may help reverse or decrease the chance of developing breathing (pulmonary) problems (especially infection) following:  A long period of time when  you are unable to move or be active. BEFORE THE PROCEDURE   If the spirometer includes an indicator to show your best effort, your nurse or respiratory therapist will set it to a desired goal.  If possible, sit up straight or lean slightly forward. Try not to slouch.  Hold the incentive spirometer in an upright position. INSTRUCTIONS FOR USE  1. Sit on the edge of your bed if possible, or sit up as far as you can in bed or on a chair. 2. Hold the incentive spirometer in an upright position. 3. Breathe out normally. 4. Place the mouthpiece in your mouth and seal your lips tightly around it. 5. Breathe in slowly and as deeply as possible, raising the piston or the ball toward the top of the column. 6. Hold your breath for 3-5 seconds or for as long as possible. Allow the piston or ball to fall to the bottom of the column. 7. Remove the mouthpiece from your mouth and breathe out normally. 8. Rest for a few seconds and repeat Steps 1 through 7 at least 10 times every 1-2 hours when you are awake. Take your time and take a few normal breaths between deep breaths. 9. The spirometer may include an indicator to show your best effort. Use the indicator as a goal to work toward during each repetition. 10. After each set of 10 deep breaths, practice coughing to be sure your lungs are clear. If you have an incision (the cut made at the time of surgery), support your incision when coughing by placing a pillow or rolled up towels firmly against it. Once you are able to get out of bed, walk around indoors and cough well. You may stop using the incentive spirometer when instructed by your caregiver.  RISKS AND COMPLICATIONS  Take your time so you do not get  dizzy or light-headed.  If you are in pain, you may need to take or ask for pain medication before doing incentive spirometry. It is harder to take a deep breath if you are having pain. AFTER USE  Rest and breathe slowly and easily.  It can be  helpful to keep track of a log of your progress. Your caregiver can provide you with a simple table to help with this. If you are using the spirometer at home, follow these instructions: [place] IF:   You are having difficultly using the spirometer.  You have trouble using the spirometer as often as instructed.  Your pain medication is not giving enough relief while using the spirometer.  You develop fever of 100.5 F (38.1 C) or higher. SEEK IMMEDIATE MEDICAL CARE IF:   You cough up bloody sputum that had not been present before.  You develop fever of 102 F (38.9 C) or greater.  You develop worsening pain at or near the incision site. MAKE SURE YOU:   Understand these instructions.  Will watch your condition.  Will get help right away if you are not doing well or get worse. Document Released: 05/13/2006 Document Revised: 03/25/2011 Document Reviewed: 07/14/2006 ExitCare Patient Information 2014 Exit[employer], [place].   ________________________________________________________________________  WHAT IS A BLOOD TRANSFUSION? Blood Transfusion Information  A transfusion is the replacement of blood or some of its parts. Blood is made up of multiple cells which provide different functions.  Red blood cells carry oxygen and are used for blood loss replacement.  White blood cells fight against infection.  Platelets control bleeding.  Plasma helps clot blood.  Other blood products are available for specialized needs, such as hemophilia or other clotting disorders. BEFORE THE TRANSFUSION  Who gives blood for transfusions?   Healthy volunteers who are fully evaluated to make sure their blood is safe. This is blood bank blood. Transfusion therapy is the safest it has ever been in the practice of medicine. Before blood is taken from a donor, a complete history is taken to make sure that person has no history of diseases nor engages in risky social behavior (examples are  intravenous drug use or sexual activity with multiple partners). The donor's travel history is screened to minimize risk of transmitting infections, such as malaria. The donated blood is tested for signs of infectious diseases, such as HIV and hepatitis. The blood is then tested to be sure it is compatible with you in order to minimize the chance of a transfusion reaction. If you or a relative donates blood, this is often done in anticipation of surgery and is not appropriate for emergency situations. It takes many days to process the donated blood. RISKS AND COMPLICATIONS Although transfusion therapy is very safe and saves many lives, the main dangers of transfusion include:   Getting an infectious disease.  Developing a transfusion reaction. This is an allergic reaction to something in the blood you were given. Every precaution is taken to prevent this. The decision to have a blood transfusion has been considered carefully by your caregiver before blood is given. Blood is not given unless the benefits outweigh the risks. AFTER THE TRANSFUSION  Right after receiving a blood transfusion, you will usually feel much better and more energetic. This is especially true if your red blood cells have gotten low (anemic). The transfusion raises the level of the red blood cells which carry oxygen, and this usually causes an energy increase.  The nurse administering the transfusion will  monitor you carefully for complications. HOME CARE INSTRUCTIONS  No special instructions are needed after a transfusion. You may find your energy is better. Speak with your caregiver about any limitations on activity for underlying diseases you may have. SEEK MEDICAL CARE IF:   Your condition is not improving after your transfusion.  You develop redness or irritation at the intravenous (IV) site. SEEK IMMEDIATE MEDICAL CARE IF:  Any of the following symptoms occur over the next 12 hours:  Shaking chills.  You have a  temperature by mouth above 102 F (38.9 C), not controlled by medicine.  Chest, back, or muscle pain.  People around you feel you are not acting correctly or are confused.  Shortness of breath or difficulty breathing.  Dizziness and fainting.  You get a rash or develop hives.  You have a decrease in urine output.  Your urine turns a dark color or changes to pink, red, or brown. Any of the following symptoms occur over the next 10 days:  You have a temperature by mouth above 102 F (38.9 C), not controlled by medicine.  Shortness of breath.  Weakness after normal activity.  The white part of the eye turns yellow (jaundice).  You have a decrease in the amount of urine or are urinating less often.  Your urine turns a dark color or changes to pink, red, or brown. Document Released: 12/29/1999 Document Revised: 03/25/2011 Document Reviewed: 08/17/2007 [facility] Patient Information 2014 [place], [place].  _______________________________________________________________________

## 2016-10-08 ENCOUNTER — Encounter (HOSPITAL_COMMUNITY)
Admission: RE | Admit: 2016-10-08 | Discharge: 2016-10-08 | Disposition: A | Discharge status: Home / Self Care | Payer: BC Managed Care – PPO | Source: Ambulatory Visit | Attending: Orthopedic Surgery | Admitting: Orthopedic Surgery

## 2016-10-08 ENCOUNTER — Encounter (HOSPITAL_COMMUNITY): Payer: Self-pay

## 2016-10-08 DIAGNOSIS — M1612 Unilateral primary osteoarthritis, left hip: Secondary | ICD-10-CM | POA: Insufficient documentation

## 2016-10-08 DIAGNOSIS — Z01818 Encounter for other preprocedural examination: Secondary | ICD-10-CM | POA: Insufficient documentation

## 2016-10-08 LAB — COMPREHENSIVE METABOLIC PANEL
ALBUMIN: 3.8 g/dL (ref 3.5–5.0)
ALT: 11 U/L — ABNORMAL LOW (ref 17–63)
AST: 19 U/L (ref 15–41)
Alkaline Phosphatase: 88 U/L (ref 38–126)
Anion gap: 6 (ref 5–15)
BUN: 13 mg/dL (ref 6–20)
CHLORIDE: 97 mmol/L — AB (ref 101–111)
CO2: 30 mmol/L (ref 22–32)
CREATININE: 0.67 mg/dL (ref 0.61–1.24)
Calcium: 9.4 mg/dL (ref 8.9–10.3)
GFR calc Af Amer: >60 mL/min (ref >60)
GLUCOSE: 98 mg/dL (ref 65–99)
Potassium: 5.3 mmol/L — ABNORMAL HIGH (ref 3.5–5.1)
Sodium: 133 mmol/L — ABNORMAL LOW (ref 135–145)
Total Bilirubin: 0.2 mg/dL — ABNORMAL LOW (ref 0.3–1.2)
Total Protein: 7.4 g/dL (ref 6.5–8.1)

## 2016-10-08 LAB — CBC
HCT: 42 % (ref 39.0–52.0)
Hemoglobin: 14 g/dL (ref 13.0–17.0)
MCH: 30.8 pg (ref 26.0–34.0)
MCHC: 33.3 g/dL (ref 30.0–36.0)
MCV: 92.5 fL (ref 78.0–100.0)
PLATELETS: 266 10*3/uL (ref 150–400)
RBC: 4.54 MIL/uL (ref 4.22–5.81)
RDW: 13.7 % (ref 11.5–15.5)
WBC: 12.2 10*3/uL — AB (ref 4.0–10.5)

## 2016-10-08 LAB — SURGICAL PCR SCREEN
MRSA, PCR: NEGATIVE
STAPHYLOCOCCUS AUREUS: NEGATIVE

## 2016-10-08 LAB — PROTIME-INR
INR: 1.01
Prothrombin Time: 13.2 seconds (ref 11.4–15.2)

## 2016-10-08 LAB — ABO/RH: ABO/RH(D): A POS

## 2016-10-08 LAB — APTT: APTT: 36 s (ref 24–36)

## 2016-10-15 ENCOUNTER — Ambulatory Visit: Payer: Self-pay | Admitting: Orthopedic Surgery

## 2016-10-15 NOTE — Anesthesia Preprocedure Evaluation (Addendum)
Anesthesia Evaluation  Patient identified by MRN, date of birth, ID band Patient awake    Reviewed: Allergy & Precautions, NPO status , Patient's Chart, lab work & pertinent test results  Airway Mallampati: II  TM Distance: >3 FB Neck ROM: Full    Dental  (+) Dental Advisory Given   Pulmonary former smoker,    breath sounds clear to auscultation       Cardiovascular hypertension, Pt. on medications + CAD, + Cardiac Stents and + Peripheral Vascular Disease   Rhythm:Regular Rate:Normal  Study Conclusions  - Left ventricle: Septal hypokinesis. The cavity size was mildly dilated. Wall thickness was increased in a pattern of mild LVH. Systolic function was normal. The estimated ejection fraction was in the range of 50% to 55%. - Aortic valve: Post TAVR with no significant peri valvular   regurgitation and stable mean gradient - Atrial septum: No defect or patent foramen ovale was identified. - Pericardium, extracardiac: Stable moderate mostly posterior and lateral pericardial effusion with no tamponade compared to July.   Neuro/Psych negative neurological ROS     GI/Hepatic negative GI ROS, Neg liver ROS,   Endo/Other  negative endocrine ROS  Renal/GU negative Renal ROS     Musculoskeletal  (+) Arthritis ,   Abdominal   Peds  Hematology  (+) Blood dyscrasia (DAPT. Last dose of plavix 5 days ago.), ,   Anesthesia Other Findings   Reproductive/Obstetrics                            Lab Results  Component Value Date   WBC 12.2 (H) 10/08/2016   HGB 14.0 10/08/2016   HCT 42.0 10/08/2016   MCV 92.5 10/08/2016   PLT 266 10/08/2016   Lab Results  Component Value Date   CREATININE 0.67 10/08/2016   BUN 13 10/08/2016   NA 133 (L) 10/08/2016   K 5.3 (H) 10/08/2016   CL 97 (L) 10/08/2016   CO2 30 10/08/2016   Lab Results  Component Value Date   INR 1.01 10/08/2016   INR 1.23 07/02/2016   INR 1.06 06/28/2016    Anesthesia Physical Anesthesia Plan  ASA: III  Anesthesia Plan: General   Post-op Pain Management:    Induction: Intravenous  PONV Risk Score and Plan: 2 and Ondansetron, Dexamethasone and Treatment may vary due to age or medical condition  Airway Management Planned: Oral ETT  Additional Equipment:   Intra-op Plan:   Post-operative Plan: Extubation in OR  Informed Consent: I have reviewed the patients History and Physical, chart, labs and discussed the procedure including the risks, benefits and alternatives for the proposed anesthesia with the patient or authorized representative who has indicated his/her understanding and acceptance.   Dental advisory given  Plan Discussed with: CRNA  Anesthesia Plan Comments:        Anesthesia Quick Evaluation
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
## 2016-10-16 ENCOUNTER — Inpatient Hospital Stay (HOSPITAL_COMMUNITY): Payer: BC Managed Care – PPO

## 2016-10-16 ENCOUNTER — Inpatient Hospital Stay (HOSPITAL_COMMUNITY): Payer: BC Managed Care – PPO | Admitting: Anesthesiology

## 2016-10-16 ENCOUNTER — Inpatient Hospital Stay (HOSPITAL_COMMUNITY)
Admission: RE | Admit: 2016-10-16 | Discharge: 2016-10-17 | DRG: 470 | Disposition: A | Discharge status: Home Health | Payer: BC Managed Care – PPO | Attending: Orthopedic Surgery | Admitting: Orthopedic Surgery

## 2016-10-16 ENCOUNTER — Encounter (HOSPITAL_COMMUNITY): Payer: Self-pay

## 2016-10-16 ENCOUNTER — Encounter (HOSPITAL_COMMUNITY)
Admission: RE | Disposition: A | Discharge status: Home Health | Payer: Self-pay | Source: Home / Self Care | Attending: Orthopedic Surgery

## 2016-10-16 DIAGNOSIS — Z955 Presence of coronary angioplasty implant and graft: Secondary | ICD-10-CM | POA: Diagnosis not present

## 2016-10-16 DIAGNOSIS — E7800 Pure hypercholesterolemia, unspecified: Secondary | ICD-10-CM | POA: Diagnosis present

## 2016-10-16 DIAGNOSIS — M169 Osteoarthritis of hip, unspecified: Secondary | ICD-10-CM | POA: Diagnosis present

## 2016-10-16 DIAGNOSIS — I10 Essential (primary) hypertension: Secondary | ICD-10-CM | POA: Diagnosis present

## 2016-10-16 DIAGNOSIS — Z79899 Other long term (current) drug therapy: Secondary | ICD-10-CM | POA: Diagnosis not present

## 2016-10-16 DIAGNOSIS — M1612 Unilateral primary osteoarthritis, left hip: Secondary | ICD-10-CM | POA: Diagnosis present

## 2016-10-16 DIAGNOSIS — Z87891 Personal history of nicotine dependence: Secondary | ICD-10-CM | POA: Diagnosis not present

## 2016-10-16 DIAGNOSIS — I739 Peripheral vascular disease, unspecified: Secondary | ICD-10-CM | POA: Diagnosis present

## 2016-10-16 DIAGNOSIS — Z7982 Long term (current) use of aspirin: Secondary | ICD-10-CM | POA: Diagnosis not present

## 2016-10-16 DIAGNOSIS — H269 Unspecified cataract: Secondary | ICD-10-CM | POA: Diagnosis present

## 2016-10-16 DIAGNOSIS — J302 Other seasonal allergic rhinitis: Secondary | ICD-10-CM | POA: Diagnosis present

## 2016-10-16 DIAGNOSIS — Z952 Presence of prosthetic heart valve: Secondary | ICD-10-CM | POA: Diagnosis not present

## 2016-10-16 DIAGNOSIS — I2510 Atherosclerotic heart disease of native coronary artery without angina pectoris: Secondary | ICD-10-CM | POA: Diagnosis present

## 2016-10-16 DIAGNOSIS — Z23 Encounter for immunization: Secondary | ICD-10-CM

## 2016-10-16 DIAGNOSIS — M069 Rheumatoid arthritis, unspecified: Secondary | ICD-10-CM | POA: Diagnosis present

## 2016-10-16 DIAGNOSIS — Z96649 Presence of unspecified artificial hip joint: Secondary | ICD-10-CM

## 2016-10-16 DIAGNOSIS — M25552 Pain in left hip: Secondary | ICD-10-CM

## 2016-10-16 DIAGNOSIS — L570 Actinic keratosis: Secondary | ICD-10-CM | POA: Diagnosis present

## 2016-10-16 HISTORY — PX: TOTAL HIP ARTHROPLASTY: SHX124

## 2016-10-16 HISTORY — DX: Osteoarthritis of hip, unspecified: M16.9

## 2016-10-16 LAB — TYPE AND SCREEN
ABO/RH(D): A POS
ANTIBODY SCREEN: NEGATIVE

## 2016-10-16 SURGERY — ARTHROPLASTY, HIP, TOTAL, ANTERIOR APPROACH
Anesthesia: General | Site: Hip | Laterality: Left

## 2016-10-16 MED ORDER — AZELASTINE HCL 0.1 % NA SOLN
1.0000 | Freq: Every day | NASAL | Status: DC
  Administered 2016-10-17: 09:00:00 1 via NASAL
  Filled 2016-10-16: qty 30

## 2016-10-16 MED ORDER — PHENOL 1.4 % MT LIQD: 1.0000 | OROMUCOSAL | Status: DC | PRN

## 2016-10-16 MED ORDER — TRANEXAMIC ACID 1000 MG/10ML IV SOLN
INTRAVENOUS | Status: AC | PRN
  Administered 2016-10-16: 2000 mg via TOPICAL

## 2016-10-16 MED ORDER — CEFAZOLIN SODIUM-DEXTROSE 1-4 GM/50ML-% IV SOLN
1.0000 g | Freq: Four times a day (QID) | INTRAVENOUS | Status: AC
  Administered 2016-10-16 (×2): 1 g via INTRAVENOUS
  Filled 2016-10-16 (×2): qty 50

## 2016-10-16 MED ORDER — 0.9 % SODIUM CHLORIDE (POUR BTL) OPTIME
TOPICAL | Status: DC | PRN
  Administered 2016-10-16: 1000 mL

## 2016-10-16 MED ORDER — TRANEXAMIC ACID 1000 MG/10ML IV SOLN
2000.0000 mg | Freq: Once | INTRAVENOUS | Status: DC
  Filled 2016-10-16: qty 20

## 2016-10-16 MED ORDER — PRAVASTATIN SODIUM 20 MG PO TABS
40.0000 mg | ORAL_TABLET | Freq: Every evening | ORAL | Status: DC
  Administered 2016-10-16: 19:00:00 40 mg via ORAL
  Filled 2016-10-16: qty 2

## 2016-10-16 MED ORDER — CEFAZOLIN SODIUM-DEXTROSE 2-4 GM/100ML-% IV SOLN
2.0000 g | INTRAVENOUS | Status: AC
  Administered 2016-10-16: 2 g via INTRAVENOUS

## 2016-10-16 MED ORDER — ONDANSETRON HCL 4 MG/2ML IJ SOLN
INTRAMUSCULAR | Status: DC | PRN
  Administered 2016-10-16: 4 mg via INTRAVENOUS

## 2016-10-16 MED ORDER — LOSARTAN POTASSIUM 50 MG PO TABS
100.0000 mg | ORAL_TABLET | Freq: Every day | ORAL | Status: DC
  Administered 2016-10-17: 09:00:00 100 mg via ORAL
  Filled 2016-10-16: qty 2

## 2016-10-16 MED ORDER — CHLORHEXIDINE GLUCONATE 4 % EX LIQD: 60.0000 mL | Freq: Once | CUTANEOUS | Status: DC

## 2016-10-16 MED ORDER — METHOCARBAMOL 500 MG PO TABS
500.0000 mg | ORAL_TABLET | Freq: Four times a day (QID) | ORAL | Status: DC | PRN
  Administered 2016-10-17 (×2): 500 mg via ORAL
  Filled 2016-10-16 (×2): qty 1

## 2016-10-16 MED ORDER — BUPIVACAINE-EPINEPHRINE (PF) 0.25% -1:200000 IJ SOLN
INTRAMUSCULAR | Status: AC
  Filled 2016-10-16: qty 30

## 2016-10-16 MED ORDER — FENTANYL CITRATE (PF) 100 MCG/2ML IJ SOLN
INTRAMUSCULAR | Status: AC
  Filled 2016-10-16: qty 2

## 2016-10-16 MED ORDER — ROCURONIUM BROMIDE 50 MG/5ML IV SOSY
PREFILLED_SYRINGE | INTRAVENOUS | Status: AC
  Filled 2016-10-16: qty 5

## 2016-10-16 MED ORDER — OXYCODONE HCL 5 MG PO TABS
5.0000 mg | ORAL_TABLET | ORAL | Status: DC | PRN
  Administered 2016-10-16: 19:00:00 10 mg via ORAL
  Administered 2016-10-16 – 2016-10-17 (×5): 5 mg via ORAL
  Filled 2016-10-16 (×2): qty 1
  Filled 2016-10-16: qty 2
  Filled 2016-10-16 (×2): qty 1
  Filled 2016-10-16: qty 2

## 2016-10-16 MED ORDER — PROPOFOL 10 MG/ML IV BOLUS
INTRAVENOUS | Status: AC
  Filled 2016-10-16: qty 40

## 2016-10-16 MED ORDER — PROPOFOL 10 MG/ML IV BOLUS
INTRAVENOUS | Status: DC | PRN
  Administered 2016-10-16: 140 mg via INTRAVENOUS

## 2016-10-16 MED ORDER — ACETAMINOPHEN 10 MG/ML IV SOLN
INTRAVENOUS | Status: AC
  Filled 2016-10-16: qty 100

## 2016-10-16 MED ORDER — FENTANYL CITRATE (PF) 100 MCG/2ML IJ SOLN
INTRAMUSCULAR | Status: DC | PRN
  Administered 2016-10-16: 50 ug via INTRAVENOUS

## 2016-10-16 MED ORDER — DIPHENHYDRAMINE HCL 12.5 MG/5ML PO ELIX: 12.5000 mg | ORAL_SOLUTION | ORAL | Status: DC | PRN

## 2016-10-16 MED ORDER — DOCUSATE SODIUM 100 MG PO CAPS
100.0000 mg | ORAL_CAPSULE | Freq: Two times a day (BID) | ORAL | Status: DC
  Administered 2016-10-16 – 2016-10-17 (×2): 100 mg via ORAL
  Filled 2016-10-16 (×2): qty 1

## 2016-10-16 MED ORDER — METOCLOPRAMIDE HCL 5 MG PO TABS: 5.0000 mg | ORAL_TABLET | Freq: Three times a day (TID) | ORAL | Status: DC | PRN

## 2016-10-16 MED ORDER — BISACODYL 10 MG RE SUPP
10.0000 mg | Freq: Every day | RECTAL | Status: DC | PRN
Start: 2016-10-16 — End: 2016-10-17

## 2016-10-16 MED ORDER — MENTHOL 3 MG MT LOZG: 1.0000 | LOZENGE | OROMUCOSAL | Status: DC | PRN

## 2016-10-16 MED ORDER — FENTANYL CITRATE (PF) 100 MCG/2ML IJ SOLN
INTRAMUSCULAR | Status: AC
  Administered 2016-10-16: 25 ug via INTRAVENOUS
  Filled 2016-10-16: qty 4

## 2016-10-16 MED ORDER — ACETAMINOPHEN 500 MG PO TABS
1000.0000 mg | ORAL_TABLET | Freq: Four times a day (QID) | ORAL | Status: AC
  Administered 2016-10-16 – 2016-10-17 (×4): 1000 mg via ORAL
  Filled 2016-10-16 (×4): qty 2

## 2016-10-16 MED ORDER — ASPIRIN EC 325 MG PO TBEC
325.0000 mg | DELAYED_RELEASE_TABLET | Freq: Every day | ORAL | Status: DC
  Administered 2016-10-17: 08:00:00 325 mg via ORAL
  Filled 2016-10-16: qty 1

## 2016-10-16 MED ORDER — SUGAMMADEX SODIUM 200 MG/2ML IV SOLN
INTRAVENOUS | Status: AC
  Filled 2016-10-16: qty 2

## 2016-10-16 MED ORDER — FENTANYL CITRATE (PF) 100 MCG/2ML IJ SOLN
25.0000 ug | INTRAMUSCULAR | Status: DC | PRN
  Administered 2016-10-16 (×2): 25 ug via INTRAVENOUS

## 2016-10-16 MED ORDER — LATANOPROST 0.005 % OP SOLN
1.0000 [drp] | Freq: Every day | OPHTHALMIC | Status: DC
  Administered 2016-10-16: 22:00:00 1 [drp] via OPHTHALMIC
  Filled 2016-10-16: qty 2.5

## 2016-10-16 MED ORDER — DEXAMETHASONE SODIUM PHOSPHATE 10 MG/ML IJ SOLN
INTRAMUSCULAR | Status: AC
  Filled 2016-10-16: qty 1

## 2016-10-16 MED ORDER — ROCURONIUM BROMIDE 10 MG/ML (PF) SYRINGE
PREFILLED_SYRINGE | INTRAVENOUS | Status: DC | PRN
  Administered 2016-10-16: 30 mg via INTRAVENOUS

## 2016-10-16 MED ORDER — STERILE WATER FOR IRRIGATION IR SOLN
Status: DC | PRN
  Administered 2016-10-16: 2000 mL

## 2016-10-16 MED ORDER — SUGAMMADEX SODIUM 200 MG/2ML IV SOLN
INTRAVENOUS | Status: DC | PRN
  Administered 2016-10-16: 120 mg via INTRAVENOUS

## 2016-10-16 MED ORDER — ONDANSETRON HCL 4 MG PO TABS: 4.0000 mg | ORAL_TABLET | Freq: Four times a day (QID) | ORAL | Status: DC | PRN

## 2016-10-16 MED ORDER — ACETAMINOPHEN 325 MG PO TABS: 650.0000 mg | ORAL_TABLET | Freq: Four times a day (QID) | ORAL | Status: DC | PRN

## 2016-10-16 MED ORDER — GLYCOPYRROLATE 0.2 MG/ML IV SOSY
PREFILLED_SYRINGE | INTRAVENOUS | Status: DC | PRN
  Administered 2016-10-16: .1 mg via INTRAVENOUS

## 2016-10-16 MED ORDER — POLYETHYLENE GLYCOL 3350 17 G PO PACK: 17.0000 g | PACK | Freq: Every day | ORAL | Status: DC | PRN

## 2016-10-16 MED ORDER — EPHEDRINE 5 MG/ML INJ
INTRAVENOUS | Status: AC
  Filled 2016-10-16: qty 10

## 2016-10-16 MED ORDER — METOCLOPRAMIDE HCL 5 MG/ML IJ SOLN: 5.0000 mg | Freq: Three times a day (TID) | INTRAMUSCULAR | Status: DC | PRN

## 2016-10-16 MED ORDER — FLEET ENEMA 7-19 GM/118ML RE ENEM: 1.0000 | ENEMA | Freq: Once | RECTAL | Status: DC | PRN

## 2016-10-16 MED ORDER — LIDOCAINE 2% (20 MG/ML) 5 ML SYRINGE
INTRAMUSCULAR | Status: AC
  Filled 2016-10-16: qty 5

## 2016-10-16 MED ORDER — INFLUENZA VAC SPLIT HIGH-DOSE 0.5 ML IM SUSY
0.5000 mL | PREFILLED_SYRINGE | INTRAMUSCULAR | Status: AC
  Administered 2016-10-17: 11:00:00 0.5 mL via INTRAMUSCULAR
  Filled 2016-10-16: qty 0.5

## 2016-10-16 MED ORDER — BUPIVACAINE-EPINEPHRINE (PF) 0.25% -1:200000 IJ SOLN
INTRAMUSCULAR | Status: DC | PRN
  Administered 2016-10-16: 30 mL

## 2016-10-16 MED ORDER — METHOCARBAMOL 1000 MG/10ML IJ SOLN
500.0000 mg | Freq: Four times a day (QID) | INTRAVENOUS | Status: DC | PRN
  Administered 2016-10-16: 500 mg via INTRAVENOUS
  Filled 2016-10-16: qty 550

## 2016-10-16 MED ORDER — CEFAZOLIN SODIUM-DEXTROSE 2-4 GM/100ML-% IV SOLN
INTRAVENOUS | Status: AC
  Filled 2016-10-16: qty 100

## 2016-10-16 MED ORDER — ACETAMINOPHEN 650 MG RE SUPP: 650.0000 mg | Freq: Four times a day (QID) | RECTAL | Status: DC | PRN

## 2016-10-16 MED ORDER — DEXAMETHASONE SODIUM PHOSPHATE 10 MG/ML IJ SOLN
10.0000 mg | Freq: Once | INTRAMUSCULAR | Status: AC
  Administered 2016-10-17: 09:00:00 10 mg via INTRAVENOUS
  Filled 2016-10-16: qty 1

## 2016-10-16 MED ORDER — TIMOLOL MALEATE 0.5 % OP SOLN
1.0000 [drp] | Freq: Two times a day (BID) | OPHTHALMIC | Status: DC
  Administered 2016-10-16 – 2016-10-17 (×2): 1 [drp] via OPHTHALMIC
  Filled 2016-10-16: qty 5

## 2016-10-16 MED ORDER — ONDANSETRON HCL 4 MG/2ML IJ SOLN
INTRAMUSCULAR | Status: AC
  Filled 2016-10-16: qty 2

## 2016-10-16 MED ORDER — CLOPIDOGREL BISULFATE 75 MG PO TABS
75.0000 mg | ORAL_TABLET | Freq: Every day | ORAL | Status: DC
  Administered 2016-10-17: 75 mg via ORAL
  Filled 2016-10-16: qty 1

## 2016-10-16 MED ORDER — MORPHINE SULFATE (PF) 4 MG/ML IV SOLN: 1.0000 mg | INTRAVENOUS | Status: DC | PRN

## 2016-10-16 MED ORDER — TRAMADOL HCL 50 MG PO TABS: 50.0000 mg | ORAL_TABLET | Freq: Four times a day (QID) | ORAL | Status: DC | PRN

## 2016-10-16 MED ORDER — GLYCOPYRROLATE 0.2 MG/ML IV SOSY
PREFILLED_SYRINGE | INTRAVENOUS | Status: AC
  Filled 2016-10-16: qty 5

## 2016-10-16 MED ORDER — EPHEDRINE SULFATE-NACL 50-0.9 MG/10ML-% IV SOSY
PREFILLED_SYRINGE | INTRAVENOUS | Status: DC | PRN
  Administered 2016-10-16 (×2): 5 mg via INTRAVENOUS

## 2016-10-16 MED ORDER — ONDANSETRON HCL 4 MG/2ML IJ SOLN: 4.0000 mg | Freq: Four times a day (QID) | INTRAMUSCULAR | Status: DC | PRN

## 2016-10-16 MED ORDER — LACTATED RINGERS IV SOLN
INTRAVENOUS | Status: DC
  Administered 2016-10-16: 1000 mL via INTRAVENOUS

## 2016-10-16 MED ORDER — ACETAMINOPHEN 10 MG/ML IV SOLN
1000.0000 mg | Freq: Once | INTRAVENOUS | Status: AC
  Administered 2016-10-16: 1000 mg via INTRAVENOUS

## 2016-10-16 MED ORDER — DEXAMETHASONE SODIUM PHOSPHATE 10 MG/ML IJ SOLN
10.0000 mg | Freq: Once | INTRAMUSCULAR | Status: AC
  Administered 2016-10-16: 10 mg via INTRAVENOUS

## 2016-10-16 MED ORDER — SODIUM CHLORIDE 0.9 % IV SOLN
INTRAVENOUS | Status: DC
  Administered 2016-10-16: 1000 mL via INTRAVENOUS
  Administered 2016-10-16: 20:00:00 via INTRAVENOUS

## 2016-10-16 MED ORDER — LIDOCAINE 2% (20 MG/ML) 5 ML SYRINGE
INTRAMUSCULAR | Status: DC | PRN
  Administered 2016-10-16: 80 mg via INTRAVENOUS

## 2016-10-16 SURGICAL SUPPLY — 34 items
BAG DECANTER FOR FLEXI CONT (MISCELLANEOUS) ×3 IMPLANT
BAG ZIPLOCK 12X15 (MISCELLANEOUS) ×3 IMPLANT
BLADE SAG 18X100X1.27 (BLADE) ×3 IMPLANT
CAPT HIP TOTAL 2 ×3 IMPLANT
CLOSURE WOUND 1/2 X4 (GAUZE/BANDAGES/DRESSINGS) ×2
CLOTH BEACON ORANGE TIMEOUT ST (SAFETY) ×3 IMPLANT
COVER PERINEAL POST (MISCELLANEOUS) ×3 IMPLANT
COVER SURGICAL LIGHT HANDLE (MISCELLANEOUS) ×3 IMPLANT
DECANTER SPIKE VIAL GLASS SM (MISCELLANEOUS) ×3 IMPLANT
DRAPE STERI IOBAN 125X83 (DRAPES) ×3 IMPLANT
DRAPE U-SHAPE 47X51 STRL (DRAPES) ×6 IMPLANT
DRSG ADAPTIC 3X8 NADH LF (GAUZE/BANDAGES/DRESSINGS) ×3 IMPLANT
DRSG MEPILEX BORDER 4X4 (GAUZE/BANDAGES/DRESSINGS) ×3 IMPLANT
DRSG MEPILEX BORDER 4X8 (GAUZE/BANDAGES/DRESSINGS) ×3 IMPLANT
DURAPREP 26ML APPLICATOR (WOUND CARE) ×3 IMPLANT
ELECT REM PT RETURN 15FT ADLT (MISCELLANEOUS) ×3 IMPLANT
EVACUATOR 1/8 PVC DRAIN (DRAIN) ×3 IMPLANT
GLOVE BIO SURGEON STRL SZ7.5 (GLOVE) ×3 IMPLANT
GLOVE BIO SURGEON STRL SZ8 (GLOVE) ×3 IMPLANT
GLOVE BIOGEL PI IND STRL 8 (GLOVE) ×2 IMPLANT
GLOVE BIOGEL PI INDICATOR 8 (GLOVE) ×4
GOWN STRL REUS W/TWL LRG LVL3 (GOWN DISPOSABLE) ×3 IMPLANT
GOWN STRL REUS W/TWL XL LVL3 (GOWN DISPOSABLE) ×3 IMPLANT
PACK ANTERIOR HIP CUSTOM (KITS) ×3 IMPLANT
STRIP CLOSURE SKIN 1/2X4 (GAUZE/BANDAGES/DRESSINGS) ×4 IMPLANT
SUT ETHIBOND NAB CT1 #1 30IN (SUTURE) ×3 IMPLANT
SUT MNCRL AB 4-0 PS2 18 (SUTURE) ×3 IMPLANT
SUT STRATAFIX 0 PDS 27 VIOLET (SUTURE) ×3
SUT VIC AB 2-0 CT1 27 (SUTURE) ×4
SUT VIC AB 2-0 CT1 TAPERPNT 27 (SUTURE) ×2 IMPLANT
SUTURE STRATFX 0 PDS 27 VIOLET (SUTURE) ×1 IMPLANT
SYR 50ML LL SCALE MARK (SYRINGE) ×3 IMPLANT
TRAY FOLEY W/METER SILVER 16FR (SET/KITS/TRAYS/PACK) IMPLANT
YANKAUER SUCT BULB TIP 10FT TU (MISCELLANEOUS) ×3 IMPLANT

## 2016-10-16 NOTE — Evaluation (Signed)
Physical Therapy Evaluation Patient Details Name: [name] [name] MRN: [id number] DOB: 1935-10-09 Today's Date: 10/16/2016   History of Present Illness  Pt is a 81 year old male s/p L direct anterior THA  Clinical Impression  Pt is s/p THA resulting in the deficits listed below (see PT Problem List).  Pt will benefit from skilled PT to increase their independence and safety with mobility to allow discharge to the venue listed below. Pt reports pain despite premedication however agreeable to mobilize as tolerated POD #0.  Pt plans to d/c home with family and friends support.     Follow Up Recommendations Home health PT;Supervision for mobility/OOB    Equipment Recommendations  Rolling walker with 5" wheels    Recommendations for Other Services       Precautions / Restrictions Precautions Precautions: Fall Restrictions Other Position/Activity Restrictions: WBAT      Mobility  Bed Mobility Overal bed mobility: Needs Assistance Bed Mobility: Supine to Sit     Supine to sit: Min assist     General bed mobility comments: assist for L LE, cues for technique  Transfers Overall transfer level: Needs assistance Equipment used: Rolling walker (2 wheeled) Transfers: Sit to/from Stand Sit to Stand: Min assist         General transfer comment: verbal cues for hand placement, assist to rise and steady  Ambulation/Gait Ambulation/Gait assistance: Min guard Ambulation Distance (Feet): 14 Feet Assistive device: Rolling walker (2 wheeled) Gait Pattern/deviations: Step-to pattern;Decreased stance time - left;Antalgic     General Gait Details: verbal cues for sequence, RW positioning, pt only ambulated in room to recliner due to pain  Stairs            Wheelchair Mobility    Modified Rankin (Stroke Patients Only)       Balance                                             Pertinent Vitals/Pain Pain Assessment: 0-10 Pain Score: 4  Pain  Location: L hip Pain Descriptors / Indicators: Aching;Sore Pain Intervention(s): Premedicated before session;Monitored during session;Limited activity within patient's tolerance;Repositioned    Home Living Family/patient expects to be discharged to:: Private residence Living Arrangements: Spouse/significant other Available Help at Discharge: Family;Friend(s);Available 24 hours/day Type of Home: House Home Access: Level entry     Home Layout: One level Home Equipment: Walker - 2 wheels;Cane - single point      Prior Function Level of Independence: Independent with assistive device(s)         Comments: using SPC prior to surgery     Hand Dominance        Extremity/Trunk Assessment        Lower Extremity Assessment Lower Extremity Assessment: LLE deficits/detail LLE Deficits / Details: anticipated post op hip weakness, pt self assists L LE with UEs       Communication   Communication: No difficulties  Cognition Arousal/Alertness: Awake/alert Behavior During Therapy: WFL for tasks assessed/performed Overall Cognitive Status: Within Functional Limits for tasks assessed                                        General Comments      Exercises     Assessment/Plan    PT Assessment Patient needs continued  PT services  PT Problem List Decreased strength;Decreased mobility;Decreased knowledge of use of DME;Pain       PT Treatment Interventions Functional mobility training;Stair training;Gait training;Therapeutic exercise;DME instruction;Therapeutic activities;Patient/family education    PT Goals (Current goals can be found in the Care Plan section)  Acute Rehab PT Goals PT Goal Formulation: With patient/family Time For Goal Achievement: 10/19/16 Potential to Achieve Goals: Good    Frequency 7X/week   Barriers to discharge        Co-evaluation               AM-PAC PT "6 Clicks" Daily Activity  Outcome Measure Difficulty turning  over in bed (including adjusting bedclothes, sheets and blankets)?: A Little Difficulty moving from lying on back to sitting on the side of the bed? : Unable Difficulty sitting down on and standing up from a chair with arms (e.g., wheelchair, bedside commode, etc,.)?: Unable Help needed moving to and from a bed to chair (including a wheelchair)?: A Little Help needed walking in hospital room?: A Little Help needed climbing 3-5 steps with a railing? : A Lot 6 Click Score: 13    End of Session Equipment Utilized During Treatment: Gait belt Activity Tolerance: Patient tolerated treatment well Patient left: in chair;with call bell/phone within reach;with family/visitor present Nurse Communication: Mobility status PT Visit Diagnosis: Difficulty in walking, not elsewhere classified (R26.2)    Time: 8502-7741 PT Time Calculation (min) (ACUTE ONLY): 12 min   Charges:   PT Evaluation $PT Eval Low Complexity: 1 Low     PT G CodesCarmelia Bake, PT, DPT 10/16/2016 Pager: [phone]  York Ram E 10/16/2016, 5:22 PM

## 2016-10-16 NOTE — Anesthesia Postprocedure Evaluation (Signed)
Anesthesia Post Note  Patient: [name] [name]  Procedure(s) Performed: LEFT TOTAL HIP ARTHROPLASTY ANTERIOR APPROACH (Left Hip)     Patient location during evaluation: PACU Anesthesia Type: General Level of consciousness: awake and alert Pain management: pain level controlled Vital Signs Assessment: post-procedure vital signs reviewed and stable Respiratory status: spontaneous breathing, nonlabored ventilation, respiratory function stable and patient connected to nasal cannula oxygen Cardiovascular status: blood pressure returned to baseline and stable Postop Assessment: no apparent nausea or vomiting Anesthetic complications: no    Last Vitals:  Vitals:   10/16/16 1353 10/16/16 1503  BP: 118/62 (!) 145/70  Pulse: 65 65  Resp: 16 16  Temp: 36.7 C 36.9 C  SpO2: 100% 99%    Last Pain:  Vitals:   10/16/16 1503  TempSrc: Oral  PainSc:                  [name]

## 2016-10-16 NOTE — Anesthesia Procedure Notes (Signed)
Procedure Name: Intubation Date/Time: 10/16/2016 8:30 AM Performed by: Carleene Cooper A Pre-anesthesia Checklist: Patient identified, Emergency Drugs available, Suction available, Patient being monitored and Timeout performed Patient Re-evaluated:Patient Re-evaluated prior to induction Oxygen Delivery Method: Circle system utilized Preoxygenation: Pre-oxygenation with 100% oxygen Induction Type: IV induction Ventilation: Mask ventilation with difficulty Laryngoscope Size: Mac and 4 Grade View: Grade I Tube type: Oral Tube size: 7.5 mm Number of attempts: 1 Airway Equipment and Method: Stylet Placement Confirmation: ETT inserted through vocal cords under direct vision,  positive ETCO2 and breath sounds checked- equal and bilateral Secured at: 21 cm Tube secured with: Tape Dental Injury: Teeth and Oropharynx as per pre-operative assessment

## 2016-10-16 NOTE — Interval H&P Note (Signed)
History and Physical Interval Note:  10/16/2016 7:25 AM  [name] [name]  has presented today for surgery, with the diagnosis of Osteoarthritis Left hip  The various methods of treatment have been discussed with the patient and family. After consideration of risks, benefits and other options for treatment, the patient has consented to  Procedure(s): LEFT TOTAL HIP ARTHROPLASTY ANTERIOR APPROACH (Left) as a surgical intervention .  The patient's history has been reviewed, patient examined, no change in status, stable for surgery.  I have reviewed the patient's chart and labs.  Questions were answered to the patient's satisfaction.     [name] [name]

## 2016-10-16 NOTE — Transfer of Care (Signed)
Immediate Anesthesia Transfer of Care Note  Patient: [name] [name]  Procedure(s) Performed: LEFT TOTAL HIP ARTHROPLASTY ANTERIOR APPROACH (Left Hip)  Patient Location: PACU  Anesthesia Type:General  Level of Consciousness: awake, alert , oriented and patient cooperative  Airway & Oxygen Therapy: Patient Spontanous Breathing and Patient connected to face mask oxygen  Post-op Assessment: Report given to RN, Post -op Vital signs reviewed and stable and Patient moving all extremities  Post vital signs: Reviewed and stable  Last Vitals:  Vitals:   10/16/16 0622  BP: (!) 161/67  Pulse: 65  Resp: 16  Temp: 36.6 C  SpO2: 97%    Last Pain:  Vitals:   10/16/16 0622  TempSrc: Oral      Patients Stated Pain Goal: 4 (80/22/33 6122)  Complications: No apparent anesthesia complications

## 2016-10-16 NOTE — Op Note (Signed)
OPERATIVE REPORT- TOTAL HIP ARTHROPLASTY   PREOPERATIVE DIAGNOSIS: Osteoarthritis of the Left hip.   POSTOPERATIVE DIAGNOSIS: Osteoarthritis of the Left  hip.   PROCEDURE: Left total hip arthroplasty, anterior approach.   SURGEON: Gaynelle Arabian, MD   ASSISTANT: Arlee Muslim, PA-C  ANESTHESIA:  General  ESTIMATED BLOOD LOSS:-600 ml    DRAINS: Hemovac x1.   COMPLICATIONS: None   CONDITION: PACU - hemodynamically stable.   BRIEF CLINICAL NOTE: [name] [name] is a 81 y.o. male who has advanced end-  stage arthritis of their Left  hip with progressively worsening pain and  dysfunction.The patient has failed nonoperative management and presents for  total hip arthroplasty.   PROCEDURE IN DETAIL: After successful administration of spinal  anesthetic, the traction boots for the [facility] bed were placed on both  feet and the patient was placed onto the [facility] bed, boots placed into the leg  holders. The Left hip was then isolated from the perineum with plastic  drapes and prepped and draped in the usual sterile fashion. ASIS and  greater trochanter were marked and a oblique incision was made, starting  at about 1 cm lateral and 2 cm distal to the ASIS and coursing towards  the anterior cortex of the femur. The skin was cut with a 10 blade  through subcutaneous tissue to the level of the fascia overlying the  tensor fascia lata muscle. The fascia was then incised in line with the  incision at the junction of the anterior third and posterior 2/3rd. The  muscle was teased off the fascia and then the interval between the TFL  and the rectus was developed. The Hohmann retractor was then placed at  the top of the femoral neck over the capsule. The vessels overlying the  capsule were cauterized and the fat on top of the capsule was removed.  A Hohmann retractor was then placed anterior underneath the rectus  femoris to give exposure to the entire anterior capsule. A T-shaped   capsulotomy was performed. The edges were tagged and the femoral head  was identified.       Osteophytes are removed off the superior acetabulum.  The femoral neck was then cut in situ with an oscillating saw. Traction  was then applied to the left lower extremity utilizing the [facility]  traction. The femoral head was then removed. Retractors were placed  around the acetabulum and then circumferential removal of the labrum was  performed. Osteophytes were also removed. Reaming starts at 47 mm to  medialize and  Increased in 2 mm increments to 53 mm. He had eroded away a significant portion of his superior acetabulum and efforts were made to restore the native acetabular center.We reamed in  approximately 40 degrees of abduction, 20 degrees anteversion. A 54 mm  pinnacle acetabular shell was then impacted in anatomic position under  fluoroscopic guidance with excellent purchase. It was slightly above the normal acetabular position but better than the pre-op hip center .We did not need to place  any additional dome screws. A 36 mm neutral + 4 marathon liner was then  placed into the acetabular shell.       The femoral lift was then placed along the lateral aspect of the femur  just distal to the vastus ridge. The leg was  externally rotated and capsule  was stripped off the inferior aspect of the femoral neck down to the  level of the lesser trochanter, this was done with electrocautery. The  femur was lifted after this was performed. The  leg was then placed in an extended and adducted position essentially delivering the femur. We also removed the capsule superiorly and the piriformis from the piriformis fossa to gain excellent exposure of the  proximal femur. Rongeur was used to remove some cancellous bone to get  into the lateral portion of the proximal femur for placement of the  initial starter reamer. The starter broaches was placed  the starter broach  and was shown to go down the center of  the canal. Broaching  with the  Corail system was then performed starting at size 8, coursing  Up to size 11. A size 11 had excellent torsional and rotational  and axial stability. The trial standard offset neck was then placed  with a 36 + 5 trial head. The hip was then reduced. We confirmed that  the stem was in the canal both on AP and lateral x-rays. It also has excellent sizing. The hip was reduced with outstanding stability through full extension and full external rotation.. AP pelvis was taken and the leg lengths were measured and found to be within 5 mm of equal. His leg was significantly short pre-op due to the high riding femoral head. The soft tissue tension would not allow for any further correction beyond what we achieved.Hip was then dislocated again and the femoral head and neck removed. The femoral broach was removed. Size 11 Corail stem with a standard offset  neck was then impacted into the femur following native anteversion. Has  excellent purchase in the canal. Excellent torsional and rotational and  axial stability. It is confirmed to be in the canal on AP and lateral  fluoroscopic views. The 36 + 5 ceramic head was placed and the hip  reduced with outstanding stability. Again AP pelvis was taken and it  confirmed that the leg lengths were unchanged from the trial. The wound was then copiously  irrigated with saline solution and the capsule reattached and repaired  with Ethibond suture. 30 ml of .25% Bupivicaine was  injected into the capsule and into the edge of the tensor fascia lata as well as subcutaneous tissue. The fascia overlying the tensor fascia lata was then closed with a running #1 V-Loc. Subcu was closed with interrupted 2-0 Vicryl and subcuticular running 4-0 Monocryl. Incision was cleaned  and dried. Steri-Strips and a bulky sterile dressing applied. Hemovac  drain was hooked to suction and then the patient was awakened and transported to  recovery in stable  condition.        Please note that a surgical assistant was a medical necessity for this procedure to perform it in a safe and expeditious manner. Assistant was necessary to provide appropriate retraction of vital neurovascular structures and to prevent femoral fracture and allow for anatomic placement of the prosthesis.  Gaynelle Arabian, M.D.
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
## 2016-10-17 LAB — CBC
HEMATOCRIT: 29.4 % — AB (ref 39.0–52.0)
Hemoglobin: 10.1 g/dL — ABNORMAL LOW (ref 13.0–17.0)
MCH: 31.3 pg (ref 26.0–34.0)
MCHC: 34.4 g/dL (ref 30.0–36.0)
MCV: 91 fL (ref 78.0–100.0)
PLATELETS: 257 10*3/uL (ref 150–400)
RBC: 3.23 MIL/uL — AB (ref 4.22–5.81)
RDW: 13.6 % (ref 11.5–15.5)
WBC: 13.1 10*3/uL — ABNORMAL HIGH (ref 4.0–10.5)

## 2016-10-17 LAB — BASIC METABOLIC PANEL
Anion gap: 5 (ref 5–15)
BUN: 16 mg/dL (ref 6–20)
CHLORIDE: 100 mmol/L — AB (ref 101–111)
CO2: 29 mmol/L (ref 22–32)
Calcium: 8.5 mg/dL — ABNORMAL LOW (ref 8.9–10.3)
Creatinine, Ser: 0.7 mg/dL (ref 0.61–1.24)
GFR calc Af Amer: >60 mL/min (ref >60)
GFR calc non Af Amer: >60 mL/min (ref >60)
Glucose, Bld: 139 mg/dL — ABNORMAL HIGH (ref 65–99)
POTASSIUM: 4.9 mmol/L (ref 3.5–5.1)
Sodium: 134 mmol/L — ABNORMAL LOW (ref 135–145)

## 2016-10-17 MED ORDER — TAMSULOSIN HCL 0.4 MG PO CAPS
0.4000 mg | ORAL_CAPSULE | Freq: Every day | ORAL | Status: DC
  Administered 2016-10-17: 08:00:00 0.4 mg via ORAL
  Filled 2016-10-17: qty 1

## 2016-10-17 MED ORDER — METHOCARBAMOL 500 MG PO TABS: 500.0000 mg | ORAL_TABLET | Freq: Four times a day (QID) | ORAL | 0 refills | Status: DC | PRN

## 2016-10-17 MED ORDER — OXYCODONE HCL 5 MG PO TABS: 5.0000 mg | ORAL_TABLET | ORAL | 0 refills | Status: DC | PRN

## 2016-10-17 MED ORDER — ASPIRIN 325 MG PO TBEC
325.0000 mg | DELAYED_RELEASE_TABLET | Freq: Every day | ORAL | 0 refills | Status: DC
Start: 2016-10-18 — End: 2016-12-01

## 2016-10-17 MED ORDER — TAMSULOSIN HCL 0.4 MG PO CAPS: 0.4000 mg | ORAL_CAPSULE | Freq: Every day | ORAL | 0 refills | Status: DC | PRN

## 2016-10-17 MED ORDER — TRAMADOL HCL 50 MG PO TABS: 50.0000 mg | ORAL_TABLET | Freq: Four times a day (QID) | ORAL | 0 refills | Status: DC | PRN

## 2016-10-17 NOTE — Progress Notes (Signed)
Subjective: 1 Day Post-Op Procedure(s) (LRB): LEFT TOTAL HIP ARTHROPLASTY ANTERIOR APPROACH (Left) Patient reports pain as mild.   Patient seen in rounds by Dr. Wynelle Link.  Doing okay this morning.  He does have a history of some prostate issues so will make sure that he is voiding well. Patient is well, but has had some minor complaints of pain in the hip, requiring pain medications We will start therapy today.  If they do well with therapy and meets all goals, then will allow home later this afternoon following therapy. Plan is to go Home after hospital stay.  Objective: Vital signs in last 24 hours: Temp:  [97.4 F (36.3 C)-98.4 F (36.9 C)] 97.7 F (36.5 C) (10/04 0452) Pulse Rate:  [56-84] 56 (10/04 0452) Resp:  [8-17] 17 (10/04 0452) BP: (109-148)/(48-79) 115/48 (10/04 0452) SpO2:  [98 %-100 %] 100 % (10/04 0452)  Intake/Output from previous day:  Intake/Output Summary (Last 24 hours) at 10/17/16 0752 Last data filed at 10/17/16 0641  Gross per 24 hour  Intake             4625 ml  Output             2810 ml  Net             1815 ml    Intake/Output this shift: No intake/output data recorded.  Labs:  Recent Labs  10/17/16 0501  HGB 10.1*    Recent Labs  10/17/16 0501  WBC 13.1*  RBC 3.23*  HCT 29.4*  PLT 257    Recent Labs  10/17/16 0501  NA 134*  K 4.9  CL 100*  CO2 29  BUN 16  CREATININE 0.70  GLUCOSE 139*  CALCIUM 8.5*   No results for input(s): LABPT, INR in the last 72 hours.  EXAM General - Patient is Alert, Appropriate and Oriented Extremity - Neurovascular intact Sensation intact distally Intact pulses distally Dorsiflexion/Plantar flexion intact Dressing - dressing C/D/I Motor Function - intact, moving foot and toes well on exam.  Hemovac pulled without difficulty.  Past Medical History:  Diagnosis Date  . Actinic keratoses    Dr Glee Arvin  . Allergic rhinitis   . Aortic stenosis   . Arthritis   . Benign essential HTN    . Bicuspid aortic valve   . Bilateral cataracts   . BPH (benign prostatic hypertrophy)   . Coronary artery disease involving native coronary artery of native heart without angina pectoris    PCI 06/06/16 LAD proximal 60 (FFR 0.85-negative); RI 50; LCx calcified without significant stenosis; RCA mid 80 (FFR 0.68-hemodynamically significant); PCI: 2.25 x 16 mm Synergy DES to the mid RCA  . Dilated aortic root (Eschbach)    16m by echo 08/2015  . Glaucoma   . H/O seasonal allergies   . Hx of adenomatous colonic polyps 1991 on  . Hx of cardiovascular stress test    Myoview (10/15):  normal  . Hyperlipidemia   . Mitral valve prolapse    mild MR by echo 08/2015  . Paresthesia    Chronic left lateral thigh    Assessment/Plan: 1 Day Post-Op Procedure(s) (LRB): LEFT TOTAL HIP ARTHROPLASTY ANTERIOR APPROACH (Left) Principal Problem:   Arthritis of left hip Active Problems:   OA (osteoarthritis) of hip  Estimated body mass index is 18.49 kg/m as calculated from the following:   Height as of this encounter: 5' 9.5" (1.765 m).   Weight as of this encounter: 57.6 kg (127 lb). Advance  diet Up with therapy Discharge home with home health  DVT Prophylaxis - Aspirin and Plavix Weight Bearing As Tolerated left Leg Hemovac Pulled Begin Therapy  If meets goals and able to go home: Discharge home with home health Diet - Cardiac diet Follow up - in 2 weeks Activity - WBAT Disposition - Home Condition Upon Discharge - pending at time of rounds, home if voiding and met therapy goals D/C Meds - See DC Summary DVT Prophylaxis - Aspirin and Plavix  Arlee Muslim, PA-C Orthopaedic Surgery 10/17/2016, 7:52 AM

## 2016-10-17 NOTE — Discharge Summary (Signed)
Physician Discharge Summary   Patient ID: [name] [name] MRN: [id number] DOB/AGE: 79-Jul-1937 79 y.o.  Admit date: 10/16/2016 Discharge date: 10-17-2016  Primary Diagnosis:  Osteoarthritis of the Left hip.   Admission Diagnoses:  Past Medical History:  Diagnosis Date  . Actinic keratoses    Dr Glee Arvin  . Allergic rhinitis   . Aortic stenosis   . Arthritis   . Benign essential HTN   . Bicuspid aortic valve   . Bilateral cataracts   . BPH (benign prostatic hypertrophy)   . Coronary artery disease involving native coronary artery of native heart without angina pectoris    PCI 06/06/16 LAD proximal 60 (FFR 0.85-negative); RI 50; LCx calcified without significant stenosis; RCA mid 80 (FFR 0.68-hemodynamically significant); PCI: 2.25 x 16 mm Synergy DES to the mid RCA  . Dilated aortic root ([place])    68m by echo 08/2015  . Glaucoma   . H/O seasonal allergies   . Hx of adenomatous colonic polyps 1991 on  . Hx of cardiovascular stress test    Myoview (10/15):  normal  . Hyperlipidemia   . Mitral valve prolapse    mild MR by echo 08/2015  . Paresthesia    Chronic left lateral thigh   Discharge Diagnoses:   Principal Problem:   Arthritis of left hip Active Problems:   OA (osteoarthritis) of hip  Estimated body mass index is 18.49 kg/m as calculated from the following:   Height as of this encounter: 5' 9.5" (1.765 m).   Weight as of this encounter: 57.6 kg (127 lb).  Procedure(s) (LRB): LEFT TOTAL HIP ARTHROPLASTY ANTERIOR APPROACH (Left)   Consults: None  HPI: [name] BEGis a 81y.o. male who has advanced end-  stage arthritis of their Left  hip with progressively worsening pain and  dysfunction.The patient has failed nonoperative management and presents for  total hip arthroplasty.   Laboratory Data: Admission on 10/16/2016  Component Date Value Ref Range Status  . WBC 10/17/2016 13.1* 4.0 - 10.5 K/uL Final  . RBC 10/17/2016 3.23* 4.22 - 5.81 MIL/uL Final    . Hemoglobin 10/17/2016 10.1* 13.0 - 17.0 g/dL Final  . HCT 10/17/2016 29.4* 39.0 - 52.0 % Final  . MCV 10/17/2016 91.0  78.0 - 100.0 fL Final  . MCH 10/17/2016 31.3  26.0 - 34.0 pg Final  . MCHC 10/17/2016 34.4  30.0 - 36.0 g/dL Final  . RDW 10/17/2016 13.6  11.5 - 15.5 % Final  . Platelets 10/17/2016 257  150 - 400 K/uL Final  . Sodium 10/17/2016 134* 135 - 145 mmol/L Final  . Potassium 10/17/2016 4.9  3.5 - 5.1 mmol/L Final  . Chloride 10/17/2016 100* 101 - 111 mmol/L Final  . CO2 10/17/2016 29  22 - 32 mmol/L Final  . Glucose, Bld 10/17/2016 139* 65 - 99 mg/dL Final  . BUN 10/17/2016 16  6 - 20 mg/dL Final  . Creatinine, Ser 10/17/2016 0.70  0.61 - 1.24 mg/dL Final  . Calcium 10/17/2016 8.5* 8.9 - 10.3 mg/dL Final  . GFR calc non Af Amer 10/17/2016 >60  >60 mL/min Final  . GFR calc Af Amer 10/17/2016 >60  >60 mL/min Final   Comment: (NOTE) The eGFR has been calculated using the CKD EPI equation. This calculation has not been validated in all clinical situations. eGFR's persistently <60 mL/min signify possible Chronic Kidney Disease.   .[name] [name] 10/17/2016 5  5 - 15 Final  Hospital Outpatient Visit on 10/08/2016  Component  Date Value Ref Range Status  . aPTT 10/08/2016 36  24 - 36 seconds Final  . WBC 10/08/2016 12.2* 4.0 - 10.5 K/uL Final  . RBC 10/08/2016 4.54  4.22 - 5.81 MIL/uL Final  . Hemoglobin 10/08/2016 14.0  13.0 - 17.0 g/dL Final  . HCT 10/08/2016 42.0  39.0 - 52.0 % Final  . MCV 10/08/2016 92.5  78.0 - 100.0 fL Final  . MCH 10/08/2016 30.8  26.0 - 34.0 pg Final  . MCHC 10/08/2016 33.3  30.0 - 36.0 g/dL Final  . RDW 10/08/2016 13.7  11.5 - 15.5 % Final  . Platelets 10/08/2016 266  150 - 400 K/uL Final  . Sodium 10/08/2016 133* 135 - 145 mmol/L Final  . Potassium 10/08/2016 5.3* 3.5 - 5.1 mmol/L Final  . Chloride 10/08/2016 97* 101 - 111 mmol/L Final  . CO2 10/08/2016 30  22 - 32 mmol/L Final  . Glucose, Bld 10/08/2016 98  65 - 99 mg/dL Final  . BUN  10/08/2016 13  6 - 20 mg/dL Final  . Creatinine, Ser 10/08/2016 0.67  0.61 - 1.24 mg/dL Final  . Calcium 10/08/2016 9.4  8.9 - 10.3 mg/dL Final  . Total Protein 10/08/2016 7.4  6.5 - 8.1 g/dL Final  . Albumin 10/08/2016 3.8  3.5 - 5.0 g/dL Final  . AST 10/08/2016 19  15 - 41 U/L Final  . ALT 10/08/2016 11* 17 - 63 U/L Final  . Alkaline Phosphatase 10/08/2016 88  38 - 126 U/L Final  . Total Bilirubin 10/08/2016 0.2* 0.3 - 1.2 mg/dL Final  . GFR calc non Af Amer 10/08/2016 >60  >60 mL/min Final  . GFR calc Af Amer 10/08/2016 >60  >60 mL/min Final   Comment: (NOTE) The eGFR has been calculated using the CKD EPI equation. This calculation has not been validated in all clinical situations. eGFR's persistently <60 mL/min signify possible Chronic Kidney Disease.   . Anion gap 10/08/2016 6  5 - 15 Final  . Prothrombin Time 10/08/2016 13.2  11.4 - 15.2 seconds Final  . INR 10/08/2016 1.01   Final  . ABO/RH(D) 10/08/2016 A POS   Final  . Antibody Screen 10/08/2016 NEG   Final  . Sample Expiration 10/08/2016 10/19/2016   Final  . Extend sample reason 10/08/2016 NO TRANSFUSIONS OR PREGNANCY IN THE PAST 3 MONTHS   Final  . MRSA, PCR 10/08/2016 NEGATIVE  NEGATIVE Final  . Staphylococcus aureus 10/08/2016 NEGATIVE  NEGATIVE Final   Comment: (NOTE) The Xpert SA Assay (FDA approved for NASAL specimens in patients 34 years of age and older), is one component of a comprehensive surveillance program. It is not intended to diagnose infection nor to guide or monitor treatment.   . ABO/RH(D) 10/08/2016 A POS   Final     X-Rays:Dg Pelvis Portable  Result Date: 10/16/2016 CLINICAL DATA:  Status post LEFT hip replacement. EXAM: PORTABLE PELVIS 1-2 VIEWS COMPARISON:  None. FINDINGS: LEFT THA.  Surgical drain.  No adverse features. IMPRESSION: Satisfactory position and alignment. Electronically Signed   By: [name] [name] M.D.   On: 10/16/2016 10:43   Dg C-arm 1-60 Min-no Report  Result Date:  10/16/2016 Fluoroscopy was utilized by the requesting physician.  No radiographic interpretation.    EKG: Orders placed or performed in visit on 07/25/16  . EKG 12-Lead     Hospital Course: Patient was admitted to [facility] and taken to the OR and underwent the above state procedure without complications.  Patient tolerated the procedure  well and was later transferred to the recovery room and then to the orthopaedic floor for postoperative care.  They were given PO and IV analgesics for pain control following their surgery.  They were given 24 hours of postoperative antibiotics of  Anti-infectives    Start     Dose/Rate Route Frequency Ordered Stop   10/16/16 1430  ceFAZolin (ANCEF) IVPB 1 g/50 mL premix     1 g 100 mL/hr over 30 Minutes Intravenous Every 6 hours 10/16/16 1130 10/17/16 0719   10/16/16 0611  ceFAZolin (ANCEF) 2-4 GM/100ML-% IVPB    Comments:  [place] Session   : cabinet override      10/16/16 [phone] 10/16/16 0832   10/16/16 0607  ceFAZolin (ANCEF) IVPB 2g/100 mL premix     2 g 200 mL/hr over 30 Minutes Intravenous On call to O.R. 10/16/16 1224 10/16/16 0902     and started on DVT prophylaxis in the form of Aspirin and Plavix.   PT and OT were ordered for total hip protocol.  The patient was allowed to be WBAT with therapy. Discharge planning was consulted to help with postop disposition and equipment needs.  Patient had a decent night on the evening of surgery.  They started to get up OOB with therapy on day one.  Hemovac drain was pulled without difficulty. Patient was seen in rounds by Dr. Wynelle Link.  Doing okay that morning.  He did have a history of some prostate issues so had to make sure that he was voiding well. Patient was seen in rounds and was ready to go home after completing therapy goals.   Discharge home with home health Diet - Cardiac diet Follow up - in 2 weeks Activity - WBAT Disposition - Home Condition Upon Discharge - improving D/C Meds - See  DC Summary DVT Prophylaxis - Aspirin and Plavix  Discharge Instructions    Call MD / Call 911    Complete by:  As directed    If you experience chest pain or shortness of breath, CALL 911 and be transported to the hospital emergency room.  If you develope a fever above 101 F, pus (white drainage) or increased drainage or redness at the wound, or calf pain, call your surgeon's office.   Change dressing    Complete by:  As directed    You may change your dressing dressing daily with sterile 4 x 4 inch gauze dressing and paper tape.  Do not submerge the incision under water.   Constipation Prevention    Complete by:  As directed    Drink plenty of fluids.  Prune juice may be helpful.  You may use a stool softener, such as Colace (over the counter) 100 mg twice a day.  Use MiraLax (over the counter) for constipation as needed.   Diet - low sodium heart healthy    Complete by:  As directed    Discharge instructions    Complete by:  As directed    Resume Plavix daily at home. Take a full dose 325 mg Aspirin daily for three weeks, they reduce back to the 81 mg Aspirin at home.   Pick up stool softner and laxative for home use following surgery while on pain medications. Do not submerge incision under water. Please use good hand washing techniques while changing dressing each day. May shower starting three days after surgery. Please use a clean towel to pat the incision dry following showers. Continue to use ice for pain and swelling after  surgery. Do not use any lotions or creams on the incision until instructed by your surgeon.  Wear both TED hose on both legs during the day every day for three weeks, but may remove the TED hose at night at home.  Postoperative Constipation Protocol  Constipation - defined medically as fewer than three stools per week and severe constipation as less than one stool per week.  One of the most common issues patients have following surgery is constipation.   Even if you have a regular bowel pattern at home, your normal regimen is likely to be disrupted due to multiple reasons following surgery.  Combination of anesthesia, postoperative narcotics, change in appetite and fluid intake all can affect your bowels.  In order to avoid complications following surgery, here are some recommendations in order to help you during your recovery period.  Colace (docusate) - Pick up an over-the-counter form of Colace or another stool softener and take twice a day as long as you are requiring postoperative pain medications.  Take with a full glass of water daily.  If you experience loose stools or diarrhea, hold the colace until you stool forms back up.  If your symptoms do not get better within 1 week or if they get worse, check with your doctor.  Dulcolax (bisacodyl) - Pick up over-the-counter and take as directed by the product packaging as needed to assist with the movement of your bowels.  Take with a full glass of water.  Use this product as needed if not relieved by Colace only.   MiraLax (polyethylene glycol) - Pick up over-the-counter to have on hand.  MiraLax is a solution that will increase the amount of water in your bowels to assist with bowel movements.  Take as directed and can mix with a glass of water, juice, soda, coffee, or tea.  Take if you go more than two days without a movement. Do not use MiraLax more than once per day. Call your doctor if you are still constipated or irregular after using this medication for 7 days in a row.  If you continue to have problems with postoperative constipation, please contact the office for further assistance and recommendations.  If you experience "the worst abdominal pain ever" or develop nausea or vomiting, please contact the office immediatly for further recommendations for treatment.   Do not sit on low chairs, stoools or toilet seats, as it may be difficult to get up from low surfaces    Complete by:  As directed     Driving restrictions    Complete by:  As directed    No driving until released by the physician.   Increase activity slowly as tolerated    Complete by:  As directed    Lifting restrictions    Complete by:  As directed    No lifting until released by the physician.   Patient may shower    Complete by:  As directed    You may shower without a dressing once there is no drainage.  Do not wash over the wound.  If drainage remains, do not shower until drainage stops.   TED hose    Complete by:  As directed    Use stockings (TED hose) for 3 weeks on both leg(s).  You may remove them at night for sleeping.   Weight bearing as tolerated    Complete by:  As directed    Laterality:  left   Extremity:  Lower     Allergies  as of 10/17/2016   No Known Allergies     Medication List    STOP taking these medications   amoxicillin 500 MG tablet Commonly known as:  AMOXIL     TAKE these medications   acetaminophen 500 MG tablet Commonly known as:  TYLENOL Take 1 tablet (500 mg total) by mouth every 6 (six) hours as needed for moderate pain. What changed:  how much to take  when to take this  additional instructions   aspirin 325 MG EC tablet Take 1 tablet (325 mg total) by mouth daily with breakfast. Take a full dose Aspirin 325 mg daily for three weeks, then reduce back to the baby 81 mg Aspirin daily at home. What changed:  medication strength  how much to take  when to take this  additional instructions   azelastine 0.1 % nasal spray Commonly known as:  ASTELIN Place 1 spray into both nostrils daily. Use in each nostril as directed   cetaphil cream Apply 1 application topically daily.   clopidogrel 75 MG tablet Commonly known as:  PLAVIX Take 1 tablet (75 mg total) by mouth daily.   latanoprost 0.005 % ophthalmic solution Commonly known as:  XALATAN Place 1 drop into both eyes at bedtime.   losartan 100 MG tablet Commonly known as:  COZAAR Take 100 mg by  mouth daily.   methocarbamol 500 MG tablet Commonly known as:  ROBAXIN Take 1 tablet (500 mg total) by mouth every 6 (six) hours as needed for muscle spasms.   oxyCODONE 5 MG immediate release tablet Commonly known as:  Oxy IR/ROXICODONE Take 1-2 tablets (5-10 mg total) by mouth every 4 (four) hours as needed for moderate pain or severe pain.   pravastatin 40 MG tablet Commonly known as:  PRAVACHOL Take 40 mg by mouth every evening.   tamsulosin 0.4 MG Caps capsule Commonly known as:  FLOMAX Take 1 capsule (0.4 mg total) by mouth daily as needed. Take daily as needed for urinary retention   timolol 0.5 % ophthalmic solution Commonly known as:  BETIMOL Place 1 drop into both eyes 2 (two) times daily.   traMADol 50 MG tablet Commonly known as:  ULTRAM Take 1-2 tablets (50-100 mg total) by mouth every 6 (six) hours as needed for moderate pain.            Discharge Care Instructions        Start     Ordered   10/17/16 0000  Weight bearing as tolerated    Question Answer Comment  Laterality left   Extremity Lower      10/17/16 0810   10/17/16 0000  Change dressing    Comments:  You may change your dressing dressing daily with sterile 4 x 4 inch gauze dressing and paper tape.  Do not submerge the incision under water.   10/17/16 0810     Follow-up Information    Gaynelle Arabian, MD. Schedule an appointment as soon as possible for a visit on 10/29/2016.   Specialty:  Orthopedic Surgery Contact information: [address] [place] [address] [phone]           Signed: Arlee Muslim, PA-C Orthopaedic Surgery 10/17/2016, 8:12 AM

## 2016-10-17 NOTE — Progress Notes (Signed)
Physical Therapy Treatment Patient Details Name: [name] [name] MRN: [id number] DOB: 28-Feb-1935 Today's Date: 10/17/2016    History of Present Illness Pt is a 81 year old male s/p L direct anterior THA    PT Comments    Pt ambulated again in hallway and little more unsteady this afternoon.  Recommended pt have friend/family standby assist once home just for safety.  Pt performed LE exercises and provided with HEP handout.  Pt plans to d/c home today and had no further questions.   Follow Up Recommendations  Home health PT;Supervision for mobility/OOB     Equipment Recommendations  Rolling walker with 5" wheels    Recommendations for Other Services       Precautions / Restrictions Precautions Precautions: Fall Restrictions Other Position/Activity Restrictions: WBAT    Mobility  Bed Mobility Overal bed mobility: Needs Assistance Bed Mobility: Supine to Sit;Sit to Supine     Supine to sit: Supervision Sit to supine: Supervision   General bed mobility comments: cues for technique  Transfers Overall transfer level: Needs assistance Equipment used: Rolling walker (2 wheeled) Transfers: Sit to/from Stand Sit to Stand: Min assist         General transfer comment: verbal cues for hand placement, assist to steady upon rise  Ambulation/Gait Ambulation/Gait assistance: Min guard Ambulation Distance (Feet): 220 Feet Assistive device: Rolling walker (2 wheeled) Gait Pattern/deviations: Step-through pattern;Antalgic;Decreased stance time - left     General Gait Details: verbal cues for sequence, RW positioning, step length, posture   Stairs            Wheelchair Mobility    Modified Rankin (Stroke Patients Only)       Balance                                            Cognition Arousal/Alertness: Awake/alert Behavior During Therapy: WFL for tasks assessed/performed Overall Cognitive Status: Within Functional Limits for tasks  assessed                                        Exercises Total Joint Exercises Quad Sets: AROM;Both;10 reps;Supine Heel Slides: AROM;Supine;10 reps;Left Hip ABduction/ADduction: AROM;Left;10 reps;Standing (all standing exercises performed with UE support) Long Arc Quad: AROM;Seated;Left;10 reps Knee Flexion: AROM;Left;10 reps;Standing Marching in Standing: AROM;Left;10 reps Standing Hip Extension: AROM;Left;10 reps    General Comments        Pertinent Vitals/Pain Pain Assessment: 0-10 Pain Score: 3  Pain Location: L hip Pain Descriptors / Indicators: Aching;Sore Pain Intervention(s): Limited activity within patient's tolerance;Monitored during session;Repositioned    Home Living                      Prior Function            PT Goals (current goals can now be found in the care plan section) Progress towards PT goals: Progressing toward goals    Frequency    7X/week      PT Plan Current plan remains appropriate    Co-evaluation              AM-PAC PT "6 Clicks" Daily Activity  Outcome Measure  Difficulty turning over in bed (including adjusting bedclothes, sheets and blankets)?: A Little Difficulty moving from lying on back to sitting on  the side of the bed? : A Little Difficulty sitting down on and standing up from a chair with arms (e.g., wheelchair, bedside commode, etc,.)?: Unable Help needed moving to and from a bed to chair (including a wheelchair)?: A Little Help needed walking in hospital room?: A Little Help needed climbing 3-5 steps with a railing? : A Little 6 Click Score: 16    End of Session   Activity Tolerance: Patient tolerated treatment well Patient left: with call bell/phone within reach;with family/visitor present;in bed Nurse Communication: Mobility status PT Visit Diagnosis: Difficulty in walking, not elsewhere classified (R26.2)     Time: 1610-9604 PT Time Calculation (min) (ACUTE ONLY): 15  min  Charges:  $Gait Training: 8-22 mins                    G Codes:      Carmelia Bake, PT, DPT 10/17/2016 Pager: [phone]  York Ram E 10/17/2016, 2:21 PM

## 2016-10-17 NOTE — Progress Notes (Signed)
Physical Therapy Treatment Patient Details Name: [name] [name] MRN: [id number] DOB: 1935-04-21 Today's Date: 10/17/2016    History of Present Illness Pt is a 81 year old male s/p L direct anterior THA    PT Comments     Pt ambulated in hallway and performed LE exercises.  Pt plans to d/c home after second session today.  Follow Up Recommendations  Home health PT;Supervision for mobility/OOB     Equipment Recommendations  Rolling walker with 5" wheels    Recommendations for Other Services       Precautions / Restrictions Precautions Precautions: Fall Restrictions Other Position/Activity Restrictions: WBAT    Mobility  Bed Mobility Overal bed mobility: Needs Assistance Bed Mobility: Supine to Sit     Supine to sit: Supervision     General bed mobility comments: cues for technique  Transfers Overall transfer level: Needs assistance Equipment used: Rolling walker (2 wheeled) Transfers: Sit to/from Stand Sit to Stand: Min guard         General transfer comment: verbal cues for hand placement  Ambulation/Gait Ambulation/Gait assistance: Min guard Ambulation Distance (Feet): 200 Feet Assistive device: Rolling walker (2 wheeled) Gait Pattern/deviations: Step-through pattern;Antalgic;Decreased stance time - left     General Gait Details: verbal cues for sequence, RW positioning, step length, posture   Stairs            Wheelchair Mobility    Modified Rankin (Stroke Patients Only)       Balance                                            Cognition Arousal/Alertness: Awake/alert Behavior During Therapy: WFL for tasks assessed/performed Overall Cognitive Status: Within Functional Limits for tasks assessed                                        Exercises Total Joint Exercises Hip ABduction/ADduction: AROM;Left;10 reps;Standing (all standing exercises performed with UE support) Long Arc Quad:  AROM;Seated;Left;10 reps Knee Flexion: AROM;Left;10 reps;Standing Marching in Standing: AROM;Left;10 reps Standing Hip Extension: AROM;Left;10 reps    General Comments        Pertinent Vitals/Pain Pain Assessment: 0-10 Pain Score: 2  Pain Location: L hip Pain Descriptors / Indicators: Aching;Sore Pain Intervention(s): Limited activity within patient's tolerance;Repositioned;Premedicated before session;Ice applied    Home Living                      Prior Function            PT Goals (current goals can now be found in the care plan section) Progress towards PT goals: Progressing toward goals    Frequency    7X/week      PT Plan Current plan remains appropriate    Co-evaluation              AM-PAC PT "6 Clicks" Daily Activity  Outcome Measure  Difficulty turning over in bed (including adjusting bedclothes, sheets and blankets)?: A Little Difficulty moving from lying on back to sitting on the side of the bed? : A Little Difficulty sitting down on and standing up from a chair with arms (e.g., wheelchair, bedside commode, etc,.)?: A Little Help needed moving to and from a bed to chair (including a wheelchair)?: A Little Help  needed walking in hospital room?: A Little Help needed climbing 3-5 steps with a railing? : A Lot 6 Click Score: 17    End of Session   Activity Tolerance: Patient tolerated treatment well Patient left: in chair;with call bell/phone within reach;with family/visitor present Nurse Communication: Mobility status PT Visit Diagnosis: Difficulty in walking, not elsewhere classified (R26.2)     Time: 2620-3559 PT Time Calculation (min) (ACUTE ONLY): 13 min  Charges:  $Therapeutic Exercise: 8-22 mins                    G Codes:       Carmelia Bake, PT, DPT 10/17/2016 Pager: [phone]  York Ram E 10/17/2016, 11:59 AM

## 2016-10-17 NOTE — Progress Notes (Signed)
Pt discharged to home with family.  Pt and family verbalized understanding of discharge instructions and follow up care.  All belongings sent home with pt.  Education provided re: pain management, incisional care, when to call MD and follow up care.  Danton Clap, RN

## 2016-10-17 NOTE — Progress Notes (Signed)
Discharge planning, spoke with patient at bedside. Have chosen [facility] for [facility]. Contacted [facility] for referral. Has RW and elevated toilets with grab bars. [phone]

## 2016-10-17 NOTE — Discharge Instructions (Signed)
° °Dr. Frank Aluisio °Total Joint Specialist °[facility] °[address] °[place], [place] [address] °[phone] ° °ANTERIOR APPROACH TOTAL HIP REPLACEMENT POSTOPERATIVE DIRECTIONS ° ° °Hip Rehabilitation, Guidelines Following Surgery  °The results of a hip operation are greatly improved after range of motion and muscle strengthening exercises. Follow all safety measures which are given to protect your hip. If any of these exercises cause increased pain or swelling in your joint, decrease the amount until you are comfortable again. Then slowly increase the exercises. Call your caregiver if you have problems or questions.  ° °HOME CARE INSTRUCTIONS  °Remove items at home which could result in a fall. This includes throw rugs or furniture in walking pathways.  °· ICE to the affected hip every three hours for 30 minutes at a time and then as needed for pain and swelling.  Continue to use ice on the hip for pain and swelling from surgery. You may notice swelling that will progress down to the foot and ankle.  This is normal after surgery.  Elevate the leg when you are not up walking on it.   °· Continue to use the breathing machine which will help keep your temperature down.  It is common for your temperature to cycle up and down following surgery, especially at night when you are not up moving around and exerting yourself.  The breathing machine keeps your lungs expanded and your temperature down. ° ° °DIET °You may resume your previous home diet once your are discharged from the hospital. ° °DRESSING / WOUND CARE / SHOWERING °You may shower 3 days after surgery, but keep the wounds dry during showering.  You may use an occlusive plastic wrap (Press'n Seal for example), NO SOAKING/SUBMERGING IN THE BATHTUB.  If the bandage gets wet, change with a clean dry gauze.  If the incision gets wet, pat the wound dry with a clean towel. °You may start showering once you are discharged home but do not  submerge the incision under water. Just pat the incision dry and apply a dry gauze dressing on daily. °Change the surgical dressing daily and reapply a dry dressing each time. ° °ACTIVITY °Walk with your walker as instructed. °Use walker as long as suggested by your caregivers. °Avoid periods of inactivity such as sitting longer than an hour when not asleep. This helps prevent blood clots.  °You may resume a sexual relationship in one month or when given the OK by your doctor.  °You may return to work once you are cleared by your doctor.  °Do not drive a car for 6 weeks or until released by you surgeon.  °Do not drive while taking narcotics. ° °WEIGHT BEARING °Weight bearing as tolerated with assist device (walker, cane, etc) as directed, use it as long as suggested by your surgeon or therapist, typically at least 4-6 weeks. ° °POSTOPERATIVE CONSTIPATION PROTOCOL °Constipation - defined medically as fewer than three stools per week and severe constipation as less than one stool per week. ° °One of the most common issues patients have following surgery is constipation.  Even if you have a regular bowel pattern at home, your normal regimen is likely to be disrupted due to multiple reasons following surgery.  Combination of anesthesia, postoperative narcotics, change in appetite and fluid intake all can affect your bowels.  In order to avoid complications following surgery, here are some recommendations in order to help you during your recovery period. ° °Colace (docusate) - Pick up an over-the-counter   form of Colace or another stool softener and take twice a day as long as you are requiring postoperative pain medications.  Take with a full glass of water daily.  If you experience loose stools or diarrhea, hold the colace until you stool forms back up.  If your symptoms do not get better within 1 week or if they get worse, check with your doctor. ° °Dulcolax (bisacodyl) - Pick up over-the-counter and take as directed  by the product packaging as needed to assist with the movement of your bowels.  Take with a full glass of water.  Use this product as needed if not relieved by Colace only.  ° °MiraLax (polyethylene glycol) - Pick up over-the-counter to have on hand.  MiraLax is a solution that will increase the amount of water in your bowels to assist with bowel movements.  Take as directed and can mix with a glass of water, juice, soda, coffee, or tea.  Take if you go more than two days without a movement. °Do not use MiraLax more than once per day. Call your doctor if you are still constipated or irregular after using this medication for 7 days in a row. ° °If you continue to have problems with postoperative constipation, please contact the office for further assistance and recommendations.  If you experience "the worst abdominal pain ever" or develop nausea or vomiting, please contact the office immediatly for further recommendations for treatment. ° °ITCHING ° If you experience itching with your medications, try taking only a single pain pill, or even half a pain pill at a time.  You can also use Benadryl over the counter for itching or also to help with sleep.  ° °TED HOSE STOCKINGS °Wear the elastic stockings on both legs for three weeks following surgery during the day but you may remove then at night for sleeping. ° °MEDICATIONS °See your medication summary on the “After Visit Summary” that the nursing staff will review with you prior to discharge.  You may have some home medications which will be placed on hold until you complete the course of blood thinner medication.  It is important for you to complete the blood thinner medication as prescribed by your surgeon.  Continue your approved medications as instructed at time of discharge. ° °PRECAUTIONS °If you experience chest pain or shortness of breath - call 911 immediately for transfer to the hospital emergency department.  °If you develop a fever greater that 101 F,  purulent drainage from wound, increased redness or drainage from wound, foul odor from the wound/dressing, or calf pain - CONTACT YOUR SURGEON.   °                                                °FOLLOW-UP APPOINTMENTS °Make sure you keep all of your appointments after your operation with your surgeon and caregivers. You should call the office at the above phone number and make an appointment for approximately two weeks after the date of your surgery or on the date instructed by your surgeon outlined in the "After Visit Summary". ° °RANGE OF MOTION AND STRENGTHENING EXERCISES  °These exercises are designed to help you keep full movement of your hip joint. Follow your caregiver's or physical therapist's instructions. Perform all exercises about fifteen times, three times per day or as directed. Exercise both hips, even if you   have had only one joint replacement. These exercises can be done on a training (exercise) mat, on the floor, on a table or on a bed. Use whatever works the best and is most comfortable for you. Use music or television while you are exercising so that the exercises are a pleasant break in your day. This will make your life better with the exercises acting as a break in routine you can look forward to.  Lying on your back, slowly slide your foot toward your buttocks, raising your knee up off the floor. Then slowly slide your foot back down until your leg is straight again.  Lying on your back spread your legs as far apart as you can without causing discomfort.  Lying on your side, raise your upper leg and foot straight up from the floor as far as is comfortable. Slowly lower the leg and repeat.  Lying on your back, tighten up the muscle in the front of your thigh (quadriceps muscles). You can do this by keeping your leg straight and trying to raise your heel off the floor. This helps strengthen the largest muscle supporting your knee.  Lying on your back, tighten up the muscles of your  buttocks both with the legs straight and with the knee bent at a comfortable angle while keeping your heel on the floor.   IF YOU ARE TRANSFERRED TO A SKILLED REHAB FACILITY If the patient is transferred to a skilled rehab facility following release from the hospital, a list of the current medications will be sent to the facility for the patient to continue.  When discharged from the skilled rehab facility, please have the facility set up the patient's [place] prior to being released. Also, the skilled facility will be responsible for providing the patient with their medications at time of release from the facility to include their pain medication, the muscle relaxants, and their blood thinner medication. If the patient is still at the rehab facility at time of the two week follow up appointment, the skilled rehab facility will also need to assist the patient in arranging follow up appointment in our office and any transportation needs.  MAKE SURE YOU:  Understand these instructions.  Get help right away if you are not doing well or get worse.    Pick up stool softner and laxative for home use following surgery while on pain medications. Do not submerge incision under water. Please use good hand washing techniques while changing dressing each day. May shower starting three days after surgery. Please use a clean towel to pat the incision dry following showers. Continue to use ice for pain and swelling after surgery. Do not use any lotions or creams on the incision until instructed by your surgeon.  Resume the Plavix at home following discharge. Take a full dose 325 mg Aspirin daily at home for three weeks, then reduce back to the 81 mg Aspirin daily at home.

## 2016-11-04 NOTE — Progress Notes (Signed)
Cardiology Office Note:    Date:  11/05/2016   ID:  [name] [name], DOB Jun 05, 1935, MRN [id number]  PCP:  [name] Dus, MD  Cardiologist:  [name] Him, MD   Referring MD: [name] Dus, MD   Chief Complaint  Patient presents with  . Coronary Artery Disease  . Aortic Stenosis  . Hyperlipidemia    History of Present Illness:    [name] [name] is a 81 y.o. male with a hx of dyslipidemia and family history of CAD, bicuspid aortic valve with moderate AS and mildly dilated aortic root and mild MVP. His AS progressed over the past year and was referred to Dr. Burt [name] for evaluation of severe AS.  LHC showed nonobstructive ASCAD in the LAD and ramus with hemodynamically significant RCA stenosis by FFR and underwent PCI of the RCA with DES.  He subsequently underwent TAVR 07/02/2016. He has a chronic pericardial effusion that was moderate on the day of the TAVR with no tamponade and had been present prior to the TAVR.  He is here today for followup and is doing well.  He denies any chest pain or pressure, SOB, DOE, PND, orthopnea, LE edema, dizziness (except when standing up too fast), palpitations or syncope. He is compliant with his meds and is tolerating meds with no SE.    Past Medical History:  Diagnosis Date  . Actinic keratoses    Dr [name] [name]  . Allergic rhinitis   . Aortic stenosis    s/p TAVR 06/2016  . Arthritis   . Benign essential HTN   . Bicuspid aortic valve   . Bilateral cataracts   . BPH (benign prostatic hypertrophy)   . Coronary artery disease involving native coronary artery of native heart without angina pectoris    PCI 06/06/16 LAD proximal 60 (FFR 0.85-negative); RI 50; LCx calcified without significant stenosis; RCA mid 80 (FFR 0.68-hemodynamically significant); PCI: 2.25 x 16 mm Synergy DES to the mid RCA  . Dilated aortic root ([name] [name])    30mm by echo 08/2015  . Glaucoma   . H/O seasonal allergies   . Hx of adenomatous colonic polyps 1991 on  . Hx of  cardiovascular stress test    Myoview (10/15):  normal  . Hyperlipidemia   . Mitral valve prolapse    mild MR by echo 08/2015  . Paresthesia    Chronic left lateral thigh  . Pericardial effusion 11/05/2016    Past Surgical History:  Procedure Laterality Date  . COLONOSCOPY  multiple  . CORONARY STENT INTERVENTION N/A 06/06/2016   Procedure: Coronary Stent Intervention;  Surgeon: [name] Mocha, MD;  Location: [facility];  Service: Cardiovascular;  Laterality: N/A;  . CYSTOSCOPY  08/12/2016   per patient; showed a cyst on bladder , liver , and kidney , sees urologist [name] [name] at [facility]  . EYE SURGERY     Bilateral cataract removal  . INTRAVASCULAR PRESSURE WIRE/FFR STUDY N/A 05/29/2016   Procedure: Intravascular Pressure Wire/FFR Study;  Surgeon: [name] Mocha, MD;  Location: [facility];  Service: Cardiovascular;  Laterality: N/A;  . RIGHT/LEFT HEART CATH AND CORONARY ANGIOGRAPHY N/A 05/29/2016   Procedure: Right/Left Heart Cath and Coronary Angiography;  Surgeon: [name] Mocha, MD;  Location: [facility];  Service: Cardiovascular;  Laterality: N/A;  . TEE WITHOUT CARDIOVERSION N/A 07/02/2016   Procedure: TRANSESOPHAGEAL ECHOCARDIOGRAM (TEE);  Surgeon: [name] Mocha, MD;  Location: [facility];  Service: Open Heart Surgery;  Laterality: N/A;  . TONSILLECTOMY  1949  .  TOTAL HIP ARTHROPLASTY Left 10/16/2016   Procedure: LEFT TOTAL HIP ARTHROPLASTY ANTERIOR APPROACH;  Surgeon: [name] Arabian, MD;  Location: [facility];  Service: Orthopedics;  Laterality: Left;  . TRANSCATHETER AORTIC VALVE REPLACEMENT, TRANSFEMORAL N/A 07/02/2016   Procedure: TRANSCATHETER AORTIC VALVE REPLACEMENT, TRANSFEMORAL;  Surgeon: [name] Mocha, MD;  Location: [facility];  Service: Open Heart Surgery;  Laterality: N/A;    Current Medications: Current Meds  Medication Sig  . acetaminophen (TYLENOL) 500 MG tablet Take 1 tablet (500 mg total) by mouth every 6 (six) hours as needed for moderate  pain. (Patient taking differently: Take 500-1,000 mg by mouth 2 (two) times daily. Takes 500mg  every AM and 1000mg  every PM.)  . aspirin EC 325 MG EC tablet Take 1 tablet (325 mg total) by mouth daily with breakfast. Take a full dose Aspirin 325 mg daily for three weeks, then reduce back to the baby 81 mg Aspirin daily at home.  Marland Kitchen azelastine (ASTELIN) 0.1 % nasal spray Place 1 spray into both nostrils daily. Use in each nostril as directed  . cetaphil (CETAPHIL) cream Apply 1 application topically daily.  . clopidogrel (PLAVIX) 75 MG tablet Take 1 tablet (75 mg total) by mouth daily.  Marland Kitchen latanoprost (XALATAN) 0.005 % ophthalmic solution Place 1 drop into both eyes at bedtime.  Marland Kitchen losartan (COZAAR) 100 MG tablet Take 100 mg by mouth daily.  . pravastatin (PRAVACHOL) 40 MG tablet Take 40 mg by mouth every evening.   . timolol (BETIMOL) 0.5 % ophthalmic solution Place 1 drop into both eyes 2 (two) times daily.     Allergies:   Patient has no known allergies.   Social History   Social History  . Marital status: Widowed    Spouse name: N/A  . Number of children: N/A  . Years of education: N/A   Social History Main Topics  . Smoking status: Former Smoker    Quit date: 07/14/1960  . Smokeless tobacco: Never Used  . Alcohol use 3.0 oz/week    5 Cans of beer per week  . Drug use: No  . Sexual activity: Not Asked   Other Topics Concern  . None   Social History Narrative  . None     Family History: The patient's family history includes Emphysema in his mother; Heart attack in his brother; Heart disease in his brother; Pancreatic cancer in his father. There is no history of Colon cancer or Stomach cancer.  ROS:   Please see the history of present illness.    ROS  All other systems reviewed and negative.   EKGs/Labs/Other Studies Reviewed:    The following studies were reviewed today: none  EKG:  EKG is not ordered today.    Recent Labs: 10/08/2016: ALT 11 10/17/2016: BUN 16;  Creatinine, Ser 0.70; Hemoglobin 10.1; Platelets 257; Potassium 4.9; Sodium 134   Recent Lipid Panel No results found for: CHOL, TRIG, HDL, CHOLHDL, VLDL, LDLCALC, LDLDIRECT  Physical Exam:    VS:  BP (!) 116/58   Pulse 82   Ht 5' 9.5" (1.765 m)   Wt 127 lb 12.8 oz (58 kg)   SpO2 90%   BMI 18.60 kg/m     Wt Readings from Last 3 Encounters:  11/05/16 127 lb 12.8 oz (58 kg)  10/16/16 127 lb (57.6 kg)  10/08/16 127 lb 3.2 oz (57.7 kg)     GEN:  Well nourished, well developed in no acute distress HEENT: Normal NECK: No JVD; No carotid bruits LYMPHATICS: No lymphadenopathy CARDIAC:  RRR, no murmurs, rubs, gallops RESPIRATORY:  Clear to auscultation without rales, wheezing or rhonchi  ABDOMEN: Soft, non-tender, non-distended MUSCULOSKELETAL:  No edema; No deformity  SKIN: Warm and dry NEUROLOGIC:  Alert and oriented x 3 PSYCHIATRIC:  Normal affect   ASSESSMENT:    1. Nonrheumatic aortic valve stenosis   2. Benign essential HTN   3. Coronary artery disease involving native coronary artery of native heart without angina pectoris   4. Pure hypercholesterolemia   5. Pericardial effusion    PLAN:    In order of problems listed above:  1.  Severe AS - S/P TAVR in June 2018.  He says that he has much more energy now.  He has no SOB.  He will continue on ASA and Plavix.  2.  HTN - BP is well controlled on current meds.  He will continue ARB.  I will check a BMET.    3.  ASCAD - LHC showed nonobstructive ASCAD in the LAD and ramus with hemodynamically significant RCA stenosis by FFR and underwent PCI of the RCA with DES.  He has not had any anginal symptoms and is feeling well.  He will continue on ASA, Plavix, statin.  4.  Hyperlipidemia with LDL goal < 70. He will continue on statin.  I will check an FLP and ALT.  5.  Moderate pericardial effusion - this is chronic and has been seen on multiple echoes with no change.  He has no history of malignancy and Chest and abdominal CT  showed no evidence of malignancy except for an abnormality in his bladder which he is seeing Urology for.  Discussed with Dr. Burt [name] and at this time will continue to follow with serial echo. I will repeat an echo in December.     Medication Adjustments/Labs and Tests Ordered: Current medicines are reviewed at length with the patient today.  Concerns regarding medicines are outlined above.  No orders of the defined types were placed in this encounter.  No orders of the defined types were placed in this encounter.   Signed, [name] Him, MD  11/05/2016 9:09 AM    [place]

## 2016-11-05 ENCOUNTER — Ambulatory Visit (INDEPENDENT_AMBULATORY_CARE_PROVIDER_SITE_OTHER): Payer: BC Managed Care – PPO | Admitting: Cardiology

## 2016-11-05 ENCOUNTER — Encounter: Payer: Self-pay | Admitting: Cardiology

## 2016-11-05 VITALS — BP 116/58 | HR 82 | Ht 69.5 in | Wt 127.8 lb

## 2016-11-05 DIAGNOSIS — I350 Nonrheumatic aortic (valve) stenosis: Secondary | ICD-10-CM | POA: Diagnosis not present

## 2016-11-05 DIAGNOSIS — I3139 Other pericardial effusion (noninflammatory): Secondary | ICD-10-CM

## 2016-11-05 DIAGNOSIS — I2510 Atherosclerotic heart disease of native coronary artery without angina pectoris: Secondary | ICD-10-CM | POA: Diagnosis not present

## 2016-11-05 DIAGNOSIS — E7800 Pure hypercholesterolemia, unspecified: Secondary | ICD-10-CM | POA: Diagnosis not present

## 2016-11-05 DIAGNOSIS — I10 Essential (primary) hypertension: Secondary | ICD-10-CM

## 2016-11-05 DIAGNOSIS — I313 Pericardial effusion (noninflammatory): Secondary | ICD-10-CM

## 2016-11-05 HISTORY — DX: Pericardial effusion (noninflammatory): I31.3

## 2016-11-05 HISTORY — DX: Other pericardial effusion (noninflammatory): I31.39

## 2016-11-05 LAB — HEPATIC FUNCTION PANEL
ALT: 9 IU/L (ref 0–44)
AST: 14 IU/L (ref 0–40)
Albumin: 3.7 g/dL (ref 3.5–4.8)
Alkaline Phosphatase: 91 IU/L (ref 39–117)
Bilirubin Total: 0.6 mg/dL (ref 0.0–1.2)
Bilirubin, Direct: 0.21 mg/dL (ref 0.00–0.40)
TOTAL PROTEIN: 6.9 g/dL (ref 6.0–8.5)

## 2016-11-05 LAB — LIPID PANEL
CHOLESTEROL TOTAL: 120 mg/dL (ref 100–199)
Chol/HDL Ratio: 3.1 ratio (ref 0.0–5.0)
HDL: 39 mg/dL — AB (ref >39)
LDL CALC: 65 mg/dL (ref 0–99)
TRIGLYCERIDES: 81 mg/dL (ref 0–149)
VLDL CHOLESTEROL CAL: 16 mg/dL (ref 5–40)

## 2016-11-05 LAB — MAGNESIUM: Magnesium: 2.2 mg/dL (ref 1.6–2.3)

## 2016-11-05 LAB — BASIC METABOLIC PANEL
BUN / CREAT RATIO: 27 — AB (ref 10–24)
BUN: 18 mg/dL (ref 8–27)
CO2: 25 mmol/L (ref 20–29)
CREATININE: 0.66 mg/dL — AB (ref 0.76–1.27)
Calcium: 9.1 mg/dL (ref 8.6–10.2)
Chloride: 94 mmol/L — ABNORMAL LOW (ref 96–106)
GFR calc Af Amer: 107 mL/min/{1.73_m2} (ref >59)
GFR, EST NON AFRICAN AMERICAN: 93 mL/min/{1.73_m2} (ref >59)
GLUCOSE: 96 mg/dL (ref 65–99)
POTASSIUM: 4.9 mmol/L (ref 3.5–5.2)
SODIUM: 134 mmol/L (ref 134–144)

## 2016-11-05 LAB — CBC
Hematocrit: 36.5 % — ABNORMAL LOW (ref 37.5–51.0)
Hemoglobin: 12 g/dL — ABNORMAL LOW (ref 13.0–17.7)
MCH: 30.4 pg (ref 26.6–33.0)
MCHC: 32.9 g/dL (ref 31.5–35.7)
MCV: 92 fL (ref 79–97)
Platelets: 356 10*3/uL (ref 150–379)
RBC: 3.95 x10E6/uL — AB (ref 4.14–5.80)
RDW: 14.3 % (ref 12.3–15.4)
WBC: 10.9 10*3/uL — AB (ref 3.4–10.8)

## 2016-11-05 NOTE — Patient Instructions (Signed)
Medication Instructions:  Your physician recommends that you continue on your current medications as directed. Please refer to the Current Medication list given to you today.   Labwork: Today for BMET, Magnesium, CBS, LFTs, lipids.    Testing/Procedures: Please reschedule ECHO to next available.   Follow-Up: Your physician wants you to follow-up in: 6 months with Dr. Radford Pax. You will receive a reminder letter in the mail two months in advance. If you don't receive a letter, please call our office to schedule the follow-up appointment.   Any Other Special Instructions Will Be Listed Below (If Applicable).     If you need a refill on your cardiac medications before your next appointment, please call your pharmacy.

## 2016-12-01 ENCOUNTER — Emergency Department (HOSPITAL_COMMUNITY): Payer: BC Managed Care – PPO

## 2016-12-01 ENCOUNTER — Other Ambulatory Visit: Payer: Self-pay

## 2016-12-01 ENCOUNTER — Inpatient Hospital Stay (HOSPITAL_COMMUNITY)
Admission: EM | Admit: 2016-12-01 | Discharge: 2016-12-04 | DRG: 193 | Disposition: A | Discharge status: Home Health | Payer: BC Managed Care – PPO | Attending: Internal Medicine | Admitting: Internal Medicine

## 2016-12-01 ENCOUNTER — Encounter (HOSPITAL_COMMUNITY): Payer: Self-pay

## 2016-12-01 DIAGNOSIS — Z825 Family history of asthma and other chronic lower respiratory diseases: Secondary | ICD-10-CM

## 2016-12-01 DIAGNOSIS — K5900 Constipation, unspecified: Secondary | ICD-10-CM | POA: Diagnosis not present

## 2016-12-01 DIAGNOSIS — Z9861 Coronary angioplasty status: Secondary | ICD-10-CM

## 2016-12-01 DIAGNOSIS — H409 Unspecified glaucoma: Secondary | ICD-10-CM | POA: Diagnosis present

## 2016-12-01 DIAGNOSIS — N323 Diverticulum of bladder: Secondary | ICD-10-CM | POA: Diagnosis present

## 2016-12-01 DIAGNOSIS — R0902 Hypoxemia: Secondary | ICD-10-CM

## 2016-12-01 DIAGNOSIS — J189 Pneumonia, unspecified organism: Secondary | ICD-10-CM | POA: Diagnosis not present

## 2016-12-01 DIAGNOSIS — Z87891 Personal history of nicotine dependence: Secondary | ICD-10-CM | POA: Diagnosis not present

## 2016-12-01 DIAGNOSIS — Z800 Family history of malignant neoplasm of digestive organs: Secondary | ICD-10-CM

## 2016-12-01 DIAGNOSIS — Z7902 Long term (current) use of antithrombotics/antiplatelets: Secondary | ICD-10-CM | POA: Diagnosis not present

## 2016-12-01 DIAGNOSIS — I2510 Atherosclerotic heart disease of native coronary artery without angina pectoris: Secondary | ICD-10-CM | POA: Diagnosis not present

## 2016-12-01 DIAGNOSIS — Z9842 Cataract extraction status, left eye: Secondary | ICD-10-CM

## 2016-12-01 DIAGNOSIS — Z79899 Other long term (current) drug therapy: Secondary | ICD-10-CM

## 2016-12-01 DIAGNOSIS — Z86010 Personal history of colonic polyps: Secondary | ICD-10-CM | POA: Diagnosis not present

## 2016-12-01 DIAGNOSIS — Z7982 Long term (current) use of aspirin: Secondary | ICD-10-CM | POA: Diagnosis not present

## 2016-12-01 DIAGNOSIS — Z952 Presence of prosthetic heart valve: Secondary | ICD-10-CM

## 2016-12-01 DIAGNOSIS — E43 Unspecified severe protein-calorie malnutrition: Secondary | ICD-10-CM | POA: Diagnosis present

## 2016-12-01 DIAGNOSIS — Z9841 Cataract extraction status, right eye: Secondary | ICD-10-CM | POA: Diagnosis not present

## 2016-12-01 DIAGNOSIS — Z8249 Family history of ischemic heart disease and other diseases of the circulatory system: Secondary | ICD-10-CM | POA: Diagnosis not present

## 2016-12-01 DIAGNOSIS — J9601 Acute respiratory failure with hypoxia: Secondary | ICD-10-CM | POA: Diagnosis present

## 2016-12-01 DIAGNOSIS — Z96642 Presence of left artificial hip joint: Secondary | ICD-10-CM | POA: Diagnosis present

## 2016-12-01 DIAGNOSIS — N400 Enlarged prostate without lower urinary tract symptoms: Secondary | ICD-10-CM | POA: Diagnosis present

## 2016-12-01 DIAGNOSIS — E785 Hyperlipidemia, unspecified: Secondary | ICD-10-CM | POA: Diagnosis present

## 2016-12-01 DIAGNOSIS — I10 Essential (primary) hypertension: Secondary | ICD-10-CM | POA: Diagnosis present

## 2016-12-01 DIAGNOSIS — Z681 Body mass index (BMI) 19 or less, adult: Secondary | ICD-10-CM

## 2016-12-01 HISTORY — DX: Pneumonia, unspecified organism: J18.9

## 2016-12-01 LAB — CBC WITH DIFFERENTIAL/PLATELET
BASOS PCT: 3 %
Basophils Absolute: 0.4 10*3/uL — ABNORMAL HIGH (ref 0.0–0.1)
EOS ABS: 2.7 10*3/uL — AB (ref 0.0–0.7)
EOS PCT: 19 %
HCT: 46.1 % (ref 39.0–52.0)
Hemoglobin: 15.3 g/dL (ref 13.0–17.0)
LYMPHS PCT: 8 %
Lymphs Abs: 1.1 10*3/uL (ref 0.7–4.0)
MCH: 30.2 pg (ref 26.0–34.0)
MCHC: 33.2 g/dL (ref 30.0–36.0)
MCV: 91.1 fL (ref 78.0–100.0)
Monocytes Absolute: 0.7 10*3/uL (ref 0.1–1.0)
Monocytes Relative: 5 %
NEUTROS PCT: 65 %
Neutro Abs: 9.4 10*3/uL — ABNORMAL HIGH (ref 1.7–7.7)
PLATELETS: 468 10*3/uL — AB (ref 150–400)
RBC: 5.06 MIL/uL (ref 4.22–5.81)
RDW: 14.9 % (ref 11.5–15.5)
WBC: 14.3 10*3/uL — ABNORMAL HIGH (ref 4.0–10.5)

## 2016-12-01 LAB — BASIC METABOLIC PANEL
ANION GAP: 9 (ref 5–15)
BUN: 17 mg/dL (ref 6–20)
CO2: 29 mmol/L (ref 22–32)
Calcium: 9 mg/dL (ref 8.9–10.3)
Chloride: 97 mmol/L — ABNORMAL LOW (ref 101–111)
Creatinine, Ser: 0.67 mg/dL (ref 0.61–1.24)
GFR calc Af Amer: >60 mL/min (ref >60)
Glucose, Bld: 96 mg/dL (ref 65–99)
POTASSIUM: 4.3 mmol/L (ref 3.5–5.1)
SODIUM: 135 mmol/L (ref 135–145)

## 2016-12-01 LAB — I-STAT CG4 LACTIC ACID, ED
LACTIC ACID, VENOUS: 1.72 mmol/L (ref 0.5–1.9)
LACTIC ACID, VENOUS: 0.92 mmol/L (ref 0.5–1.9)

## 2016-12-01 LAB — INFLUENZA PANEL BY PCR (TYPE A & B)
INFLAPCR: NEGATIVE
Influenza B By PCR: NEGATIVE

## 2016-12-01 LAB — I-STAT BETA HCG BLOOD, ED (MC, WL, AP ONLY)

## 2016-12-01 LAB — MRSA PCR SCREENING: MRSA by PCR: NEGATIVE

## 2016-12-01 MED ORDER — DM-GUAIFENESIN ER 30-600 MG PO TB12
1.0000 | ORAL_TABLET | Freq: Two times a day (BID) | ORAL | Status: DC
  Administered 2016-12-01 – 2016-12-04 (×6): 1 via ORAL
  Filled 2016-12-01 (×6): qty 1

## 2016-12-01 MED ORDER — ALBUTEROL SULFATE (2.5 MG/3ML) 0.083% IN NEBU
5.0000 mg | INHALATION_SOLUTION | Freq: Once | RESPIRATORY_TRACT | Status: AC
  Administered 2016-12-01: 5 mg via RESPIRATORY_TRACT
  Filled 2016-12-01: qty 6

## 2016-12-01 MED ORDER — POLYETHYLENE GLYCOL 3350 17 G PO PACK
17.0000 g | PACK | Freq: Every day | ORAL | Status: DC
  Administered 2016-12-01 – 2016-12-04 (×4): 17 g via ORAL
  Filled 2016-12-01 (×4): qty 1

## 2016-12-01 MED ORDER — ENSURE ENLIVE PO LIQD
237.0000 mL | Freq: Two times a day (BID) | ORAL | Status: DC
  Administered 2016-12-02: 237 mL via ORAL

## 2016-12-01 MED ORDER — DOCUSATE SODIUM 100 MG PO CAPS
100.0000 mg | ORAL_CAPSULE | Freq: Two times a day (BID) | ORAL | Status: DC
  Administered 2016-12-01 – 2016-12-04 (×6): 100 mg via ORAL
  Filled 2016-12-01 (×6): qty 1

## 2016-12-01 MED ORDER — ENOXAPARIN SODIUM 40 MG/0.4ML ~~LOC~~ SOLN: 40.0000 mg | SUBCUTANEOUS | Status: DC

## 2016-12-01 MED ORDER — DEXTROSE 5 % IV SOLN
500.0000 mg | Freq: Once | INTRAVENOUS | Status: AC
  Administered 2016-12-01: 500 mg via INTRAVENOUS
  Filled 2016-12-01: qty 500

## 2016-12-01 MED ORDER — PREDNISONE 20 MG PO TABS
40.0000 mg | ORAL_TABLET | Freq: Every day | ORAL | Status: DC
  Administered 2016-12-02 – 2016-12-04 (×3): 40 mg via ORAL
  Filled 2016-12-01 (×3): qty 2

## 2016-12-01 MED ORDER — DEXTROSE 5 % IV SOLN
1.0000 g | Freq: Once | INTRAVENOUS | Status: AC
  Administered 2016-12-01: 1 g via INTRAVENOUS
  Filled 2016-12-01: qty 10

## 2016-12-01 MED ORDER — ALBUTEROL SULFATE (2.5 MG/3ML) 0.083% IN NEBU: 2.5000 mg | INHALATION_SOLUTION | RESPIRATORY_TRACT | Status: DC | PRN

## 2016-12-01 MED ORDER — AZITHROMYCIN 250 MG PO TABS
500.0000 mg | ORAL_TABLET | ORAL | Status: DC
  Administered 2016-12-02 – 2016-12-03 (×2): 500 mg via ORAL
  Filled 2016-12-01 (×2): qty 2

## 2016-12-01 MED ORDER — SODIUM CHLORIDE 0.9 % IV BOLUS (SEPSIS)
250.0000 mL | Freq: Once | INTRAVENOUS | Status: AC
  Administered 2016-12-01: 250 mL via INTRAVENOUS

## 2016-12-01 MED ORDER — DEXTROSE 5 % IV SOLN
1.0000 g | INTRAVENOUS | Status: DC
  Administered 2016-12-02 – 2016-12-03 (×2): 1 g via INTRAVENOUS
  Filled 2016-12-01 (×3): qty 10

## 2016-12-01 MED ORDER — SODIUM CHLORIDE 0.9 % IV SOLN
INTRAVENOUS | Status: DC
  Administered 2016-12-01 (×2): via INTRAVENOUS

## 2016-12-01 MED ORDER — SENNA 8.6 MG PO TABS
2.0000 | ORAL_TABLET | Freq: Every day | ORAL | Status: DC
  Administered 2016-12-01 – 2016-12-03 (×3): 17.2 mg via ORAL
  Filled 2016-12-01 (×3): qty 2

## 2016-12-01 NOTE — ED Provider Notes (Signed)
[place] DEPT Provider Note   CSN: [id number] Arrival date & time: 12/01/16  1149     History   Chief Complaint Chief Complaint  Patient presents with  . Shortness of Breath    HPI [name] [name] is a 81 y.o. male.   The patient presents for evaluation of cough, productive of sputum, and general weakness, for 10 days.  He feels generally weak, and has decreased appetite but is not having fever, chills, nausea, vomiting, headache, neck pain or back pain.  No chronic similar problems.  Recently hospitalized for hip fracture repair, greater than 1 month ago.  Today he went to an urgent care and was subsequently referred here for evaluation of abnormal chest x-ray.  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  He is here with his son.  There are no other known modifying factors.  HPI  Past Medical History:  Diagnosis Date  . Actinic keratoses    Dr Glee Arvin  . Allergic rhinitis   . Aortic stenosis    s/p TAVR 06/2016  . Arthritis   . Benign essential HTN   . Bicuspid aortic valve   . Bilateral cataracts   . BPH (benign prostatic hypertrophy)   . Coronary artery disease involving native coronary artery of native heart without angina pectoris    PCI 06/06/16 LAD proximal 60 (FFR 0.85-negative); RI 50; LCx calcified without significant stenosis; RCA mid 80 (FFR 0.68-hemodynamically significant); PCI: 2.25 x 16 mm Synergy DES to the mid RCA  . Dilated aortic root ([place])    16mm by echo 08/2015  . Glaucoma   . H/O seasonal allergies   . Hx of adenomatous colonic polyps 1991 on  . Hx of cardiovascular stress test    Myoview (10/15):  normal  . Hyperlipidemia   . Mitral valve prolapse    mild MR by echo 08/2015  . Paresthesia    Chronic left lateral thigh  . Pericardial effusion 11/05/2016    Patient Active Problem List   Diagnosis Date Noted  . Pericardial effusion 11/05/2016  . OA (osteoarthritis) of hip 10/16/2016  . S/P  TAVR (transcatheter aortic valve replacement) 07/02/2016  . Bladder mass 06/14/2016  . Coronary artery disease involving native coronary artery of native heart without angina pectoris   . Benign essential HTN   . Bilateral hand pain 08/14/2015  . Arthritis of left hip 11/03/2014  . Left knee pain 10/06/2014  . Aortic stenosis 09/09/2013  . Bicuspid aortic valve 09/09/2013  . History of colonic polyps 05/12/2007  . HLD (hyperlipidemia) 05/12/2007  . INTERNAL HEMORRHOIDS 05/12/2007    Past Surgical History:  Procedure Laterality Date  . COLONOSCOPY  multiple  . Coronary Stent Intervention N/A 06/06/2016   Performed by [name], MD at [place] [facility]  . CYSTOSCOPY  08/12/2016   per patient; showed a cyst on bladder , liver , and kidney , sees urologist Matilde Bash III at [facility]  . EYE SURGERY     Bilateral cataract removal  . Intravascular Pressure Wire/FFR Study N/A 05/29/2016   Performed by [name], MD at [place] [facility]  . LEFT TOTAL HIP ARTHROPLASTY ANTERIOR APPROACH Left 10/16/2016   Performed by Gaynelle Arabian, MD at [facility]  . Right/Left Heart Cath and Coronary Angiography N/A 05/29/2016   Performed by [name], MD at [place] [facility]  . TONSILLECTOMY  1949  . TRANSCATHETER AORTIC VALVE REPLACEMENT, TRANSFEMORAL N/A 07/02/2016   Performed  by [name], MD at [place]  . TRANSESOPHAGEAL ECHOCARDIOGRAM (TEE) N/A 07/02/2016   Performed by [name], MD at [facility]    Prior to Admission medications   Medication Sig Start Date End Date Taking? Authorizing Provider  acetaminophen (TYLENOL) 500 MG tablet Take 1 tablet (500 mg total) by mouth every 6 (six) hours as needed for moderate pain. Patient taking differently: Take 500-1,000 mg by mouth 2 (two) times daily. Takes 500mg  every AM and 1000mg  every PM. 06/06/16  Yes Cheryln Manly, NP  aspirin EC 81 MG tablet Take 81 mg daily by mouth.   Yes [provider]    cetaphil (CETAPHIL) cream Apply 1 application topically daily.   Yes [provider]  clopidogrel (PLAVIX) 75 MG tablet Take 1 tablet (75 mg total) by mouth daily. 07/25/16 07/25/17 Yes [name], MD  latanoprost (XALATAN) 0.005 % ophthalmic solution Place 1 drop into both eyes at bedtime.   Yes [provider]  losartan (COZAAR) 100 MG tablet Take 100 mg by mouth daily.   Yes [provider]  pravastatin (PRAVACHOL) 40 MG tablet Take 40 mg by mouth every evening.  06/03/11  Yes [provider]  timolol (BETIMOL) 0.5 % ophthalmic solution Place 1 drop into both eyes 2 (two) times daily.   Yes [provider]    Family History Family History  Problem Relation Age of Onset  . Emphysema Mother   . Pancreatic cancer Father   . Heart attack Brother   . Heart disease Brother   . Colon cancer Neg Hx   . Stomach cancer Neg Hx     Social History Social History   Tobacco Use  . Smoking status: Former Smoker    Last attempt to quit: 07/14/1960    Years since quitting: 56.4  . Smokeless tobacco: Never Used  Substance Use Topics  . Alcohol use: Yes    Alcohol/week: 3.0 oz    Types: 5 Cans of beer per week  . Drug use: No     Allergies   Patient has no known allergies.   Review of Systems Review of Systems  All other systems reviewed and are negative.    Physical Exam Updated Vital Signs BP (!) 152/74   Pulse 73   Temp (!) 97.3 F (36.3 C) (Oral)   Resp (!) 25   SpO2 94%   Physical Exam  Constitutional: He is oriented to person, place, and time. He appears well-developed.  Frail, elderly  HENT:  Head: Normocephalic and atraumatic.  Right Ear: External ear normal.  Left Ear: External ear normal.  Eyes: Conjunctivae and EOM are normal. Pupils are equal, round, and reactive to light.  Neck: Normal range of motion and phonation normal. Neck supple.  Cardiovascular: Normal rate, regular rhythm and normal heart sounds.   Pulmonary/Chest: Effort normal and breath sounds normal. No accessory muscle usage. No respiratory distress. He exhibits no bony tenderness.  Decreased air movement bilaterally without audible wheezes with rales or rhonchi.  Abdominal: Soft. There is no tenderness.  Musculoskeletal: Normal range of motion.       Right lower leg: He exhibits no tenderness and no edema.       Left lower leg: He exhibits no tenderness and no edema.  Neurological: He is alert and oriented to person, place, and time. No cranial nerve deficit or sensory deficit. He exhibits normal muscle tone. Coordination normal.  Skin: Skin is  warm, dry and intact.  Psychiatric: He has a normal mood and affect. His behavior is normal. Judgment and thought content normal.  Nursing note and vitals reviewed.    ED Treatments / Results  Labs (all labs ordered are listed, but only abnormal results are displayed) Labs Reviewed  CULTURE, BLOOD (ROUTINE X 2)  CULTURE, BLOOD (ROUTINE X 2)  CBC WITH DIFFERENTIAL/PLATELET  BASIC METABOLIC PANEL  I-STAT BETA HCG BLOOD, ED (MC, [facility], AP ONLY)    EKG  EKG Interpretation  Date/Time:  Sunday December 01 2016 12:42:27 EST Ventricular Rate:  72 PR Interval:    QRS Duration: 94 QT Interval:  439 QTC Calculation: 478 R Axis:   -64 Text Interpretation:  Sinus rhythm Artifact Left atrial enlargement Inferior infarct, old Anterior infarct, old since last tracing no significant change Confirmed by Daleen Bo [phone]) on 12/01/2016 2:10:27 PM       Radiology Dg Chest 2 View  Result Date: 12/01/2016 CLINICAL DATA:  Cough and shortness of breath for the past 10 days. EXAM: CHEST  2 VIEW COMPARISON:  Chest x-ray from same date. FINDINGS: The cardiomediastinal silhouette is normal in size. Prior aortic valve replacement. Unchanged patchy airspace opacities throughout the right lung and left upper lobe. No pleural effusion or pneumothorax. No acute osseous abnormality. IMPRESSION:  Unchanged patchy bilateral airspace opacities. Electronically Signed   By: Titus Dubin M.D.   On: 12/01/2016 13:36    Procedures Procedures (including critical care time)  Medications Ordered in ED Medications  sodium chloride 0.9 % bolus 250 mL (not administered)  0.9 %  sodium chloride infusion (not administered)  cefTRIAXone (ROCEPHIN) 1 g in dextrose 5 % 50 mL IVPB (not administered)  azithromycin (ZITHROMAX) 500 mg in dextrose 5 % 250 mL IVPB (not administered)  albuterol (PROVENTIL) (2.5 MG/3ML) 0.083% nebulizer solution 5 mg (5 mg Nebulization Given 12/01/16 1247)     Initial Impression / Assessment and Plan / ED Course  I have reviewed the triage vital signs and the nursing notes.  Pertinent labs & imaging results that were available during my care of the patient were reviewed by me and considered in my medical decision making (see chart for details).      Patient Vitals for the past 24 hrs:  BP Temp Temp src Pulse Resp SpO2  12/01/16 1440 (!) 152/74 - - 73 (!) 25 94 %  12/01/16 1400 (!) 143/71 - - 76 (!) 21 90 %  12/01/16 1339 (!) 149/68 - - 71 (!) 24 96 %  12/01/16 1309 (!) 147/62 - - 75 (!) 27 98 %  12/01/16 1240 - - - 80 - 96 %  12/01/16 1238 (!) 147/62 - - - 17 -  12/01/16 1229 115/65 (!) 97.3 F (36.3 C) Oral 81 16 (!) 83 %    24:55 PM Reevaluation with update and discussion. After initial assessment and treatment, an updated evaluation reveals patient remains comfortable with reassuring evaluation of vital signs, and clinical status.  Patient and son updated on findings and plan.Daleen Bo    Case discussed with hospitalist to arrange admission.  Patient to be admitted to telemetry unit.  Final Clinical Impressions(s) / ED Diagnoses   Final diagnoses:  Community acquired pneumonia, unspecified laterality   Likely pneumonia, with hypoxia.  Patient does not have history of metastatic disease.  Doubt sepsis, or impending respiratory  collapse.  Nursing Notes Reviewed/ Care Coordinated Applicable Imaging Reviewed Interpretation of Laboratory Data incorporated into ED treatment  Plan: Admit  ED Discharge Orders    None       Daleen Bo, MD 12/01/16 1807

## 2016-12-01 NOTE — ED Notes (Signed)
Bed: WA16 Expected date:  Expected time:  Means of arrival:  Comments: Hold for Res A  

## 2016-12-01 NOTE — H&P (Addendum)
History and Physical    EDIBERTO SENS WPY:[id number] DOB: Sep 14, 1935 DOA: 12/01/2016  Referring MD/NP/PA: Daleen Bo (from PCP's office) PCP: Maury Dus, MD   Patient coming from: home  Chief Complaint: cough, SOB  HPI: [name] [name] is a 81 y.o. male with history of coronary artery disease status post PCI to the LAD in May 2018, aortic valve stenosis status post TAVR in June 2018, essential hypertension, bladder diverticulum who presents with a 10-day history of cough productive of nonbloody sputum, fatigue, possible low grade fevers, and mild SOB with exertion.  He denies sore throat, sinus congestion, headache, vomiting, or diarrhea.  He was seen by his PCP today who was concerned that he was hypoxic. He had a CXR done as an outpatient which demonstrated multifocal pneumonia and he was referred to the hospital for admission.    ED Course: Temperature 97.3, pulse 75, blood pressure 122/56, respirations 23 oxygen saturation 83% on room air and increased to the mid 90s on nasal cannula.  White blood cell count 14.3, lactic acid 1.7.  Chest x-ray demonstrates multifocal pneumonia throughout the right lung and in the left upper lobe.  Blood cultures were obtained in the emergency department and he was started on treatment for community-acquired pneumonia.    Review of Systems:  General:  Has not been checking his temperature.  Denies chills, weight change HEENT:  Denies changes to hearing and vision, sinus congestion, sore throat CV:  Denies chest pain, palpitations, lower extremity edema.  PULM:  Per HPI GI:  Denies nausea, vomiting, diarrhea.  constipated GU:  Denies dysuria, frequency, urgency ENDO:  Denies polyuria, polydipsia.   HEME:  Denies blood in stools, abnormal bruising or bleeding.  LYMPH:  Denies lymphadenopathy.   MSK:  Denies arthralgias, myalgias.  Hip feels better after hip replacement last month. DERM:  Denies skin rash or ulcer.   NEURO:  Denies new focal  numbness or weakness, slurred speech, confusion PSYCH:  Denies anxiety and depression.    Past Medical History:  Diagnosis Date  . Actinic keratoses    Dr Glee Arvin  . Allergic rhinitis   . Aortic stenosis    s/p TAVR 06/2016  . Arthritis   . Benign essential HTN   . Bicuspid aortic valve   . Bilateral cataracts   . BPH (benign prostatic hypertrophy)   . Coronary artery disease involving native coronary artery of native heart without angina pectoris    PCI 06/06/16 LAD proximal 60 (FFR 0.85-negative); RI 50; LCx calcified without significant stenosis; RCA mid 80 (FFR 0.68-hemodynamically significant); PCI: 2.25 x 16 mm Synergy DES to the mid RCA  . Dilated aortic root ([place])    42mm by echo 08/2015  . Glaucoma   . H/O seasonal allergies   . Hx of adenomatous colonic polyps 1991 on  . Hx of cardiovascular stress test    Myoview (10/15):  normal  . Hyperlipidemia   . Mitral valve prolapse    mild MR by echo 08/2015  . Paresthesia    Chronic left lateral thigh  . Pericardial effusion 11/05/2016    Past Surgical History:  Procedure Laterality Date  . COLONOSCOPY  multiple  . Coronary Stent Intervention N/A 06/06/2016   Performed by [name], MD at [place] [facility]  . CYSTOSCOPY  08/12/2016   per patient; showed a cyst on bladder , liver , and kidney , sees [facility]rologist Matilde [facility] at [facility]  . EYE SURGERY     Bilateral  cataract removal  . Intravascular Pressure Wire/FFR Study N/A 05/29/2016   Performed by [name], MD at [place] [facility]  . LEFT TOTAL HIP ARTHROPLASTY ANTERIOR APPROACH Left 10/16/2016   Performed by Gaynelle Arabian, MD at [facility]  . Right/Left Heart Cath and Coronary Angiography N/A 05/29/2016   Performed by [name], MD at [place] [facility]  . TONSILLECTOMY  1949  . TRANSCATHETER AORTIC VALVE REPLACEMENT, TRANSFEMORAL N/A 07/02/2016   Performed by [name], MD at [place]  . TRANSESOPHAGEAL ECHOCARDIOGRAM (TEE) N/A 07/02/2016    Performed by [name], MD at [place]     reports that he quit smoking about 56 years ago. he has never used smokeless tobacco. He reports that he drinks about 3.0 oz of alcohol per week. He reports that he does not use drugs.  No Known Allergies  Family History  Problem Relation Age of Onset  . Emphysema Mother   . Pancreatic cancer Father   . Heart attack Brother   . Heart disease Brother   . Colon cancer Neg Hx   . Stomach cancer Neg Hx     Prior to Admission medications   Medication Sig Start Date End Date Taking? Authorizing Provider  acetaminophen (TYLENOL) 500 MG tablet Take 1 tablet (500 mg total) by mouth every 6 (six) hours as needed for moderate pain. Patient taking differently: Take 500-1,000 mg by mouth 2 (two) times daily. Takes 500mg  every AM and 1000mg  every PM. 06/06/16  Yes Cheryln Manly, NP  aspirin EC 81 MG tablet Take 81 mg daily by mouth.   Yes [provider]  cetaphil (CETAPHIL) cream Apply 1 application topically daily.   Yes [provider]  clopidogrel (PLAVIX) 75 MG tablet Take 1 tablet (75 mg total) by mouth daily. 07/25/16 07/25/17 Yes [name], MD  latanoprost (XALATAN) 0.005 % ophthalmic solution Place 1 drop into both eyes at bedtime.   Yes [provider]  losartan (COZAAR) 100 MG tablet Take 100 mg by mouth daily.   Yes [provider]  pravastatin (PRAVACHOL) 40 MG tablet Take 40 mg by mouth every evening.  06/03/11  Yes [provider]  timolol (BETIMOL) 0.5 % ophthalmic solution Place 1 drop into both eyes 2 (two) times daily.   Yes [provider]    Physical Exam: Vitals:   12/01/16 1600 12/01/16 1630 12/01/16 1701 12/01/16 1730  BP: (!) 109/54 125/63 (!) 127/59 (!) 122/56  Pulse: 78 74 73 75  Resp: (!) 22  (!) 22 (!) 23  Temp:      TempSrc:      SpO2: 98% 95% 97% 96%      Constitutional:  Thin male with temporal wasting, NAD, calm, comfortable Eyes: PERRL, lids and  conjunctivae normal, nasal canula in place ENMT:  Moist mucous membranes.  Oropharynx nonerythematous, no exudates.   Neck:  No nuchal rigidity, no masses Respiratory:  Course rales at the bilateral bases with scattered wheezes, + egophany, no respiratory distress, no rhonchi.  Cardiovascular: Regular rate and rhythm, no murmurs / rubs / gallops.  2+ radial pulses. Abdomen:  Normal active bowel sounds, soft, nondistended, nontender Musculoskeletal: Normal muscle tone and bulk.  No contractures.  Skin:  no rashes, abrasions, or ulcers Neurologic:  CN 2-12 grossly intact. Sensation intact to light touch, strength 5/5 throughout Psychiatric:  Alert and oriented x 3. Normal affect.   Labs on Admission: I have personally reviewed following labs and imaging studies  CBC: Recent Labs  Lab 12/01/16 1424  WBC 14.3*  NEUTROABS 9.4*  HGB 15.3  HCT 46.1  MCV 91.1  PLT 836*   Basic Metabolic Panel: Recent Labs  Lab 12/01/16 1424  NA 135  K 4.3  CL 97*  CO2 29  GLUCOSE 96  BUN 17  CREATININE 0.67  CALCIUM 9.0   GFR: CrCl cannot be calculated (Unknown ideal weight.). Liver Function Tests: No results for input(s): AST, ALT, ALKPHOS, BILITOT, PROT, ALBUMIN in the last 168 hours. No results for input(s): LIPASE, AMYLASE in the last 168 hours. No results for input(s): AMMONIA in the last 168 hours. Coagulation Profile: No results for input(s): INR, PROTIME in the last 168 hours. Cardiac Enzymes: No results for input(s): CKTOTAL, CKMB, CKMBINDEX, TROPONINI in the last 168 hours. BNP (last 3 results) No results for input(s): PROBNP in the last 8760 hours. HbA1C: No results for input(s): HGBA1C in the last 72 hours. CBG: No results for input(s): GLUCAP in the last 168 hours. Lipid Profile: No results for input(s): CHOL, HDL, LDLCALC, TRIG, CHOLHDL, LDLDIRECT in the last 72 hours. Thyroid Function Tests: No results for input(s): TSH, T4TOTAL, FREET4, T3FREE, THYROIDAB in the last 72  hours. Anemia Panel: No results for input(s): VITAMINB12, FOLATE, FERRITIN, TIBC, IRON, RETICCTPCT in the last 72 hours. Urine analysis:    Component Value Date/Time   COLORURINE YELLOW 06/28/2016 1155   APPEARANCEUR CLEAR 06/28/2016 1155   LABSPEC 1.014 06/28/2016 1155   PHURINE 5.0 06/28/2016 1155   GLUCOSEU NEGATIVE 06/28/2016 1155   HGBUR NEGATIVE 06/28/2016 [place] 06/28/2016 [place] 06/28/2016 1155   PROTEINUR NEGATIVE 06/28/2016 1155   NITRITE NEGATIVE 06/28/2016 1155   LEUKOCYTESUR NEGATIVE 06/28/2016 1155   Sepsis Labs: @LABRCNTIP (procalcitonin:4,lacticidven:4) )No results found for this or any previous visit (from the past 240 hour(s)).   Radiological Exams on Admission: Dg Chest 2 View  Result Date: 12/01/2016 CLINICAL DATA:  Cough and shortness of breath for the past 10 days. EXAM: CHEST  2 VIEW COMPARISON:  Chest x-ray from same date. FINDINGS: The cardiomediastinal silhouette is normal in size. Prior aortic valve replacement. Unchanged patchy airspace opacities throughout the right lung and left upper lobe. No pleural effusion or pneumothorax. No acute osseous abnormality. IMPRESSION: Unchanged patchy bilateral airspace opacities. Electronically Signed   By: Titus Dubin M.D.   On: 12/01/2016 13:36    EKG: Independently reviewed. NSR with evidence of previous inferior and anterior infarcts.    Assessment/Plan Active Problems:   * No active hospital problems. *   Acute hypoxic respiratory failure due to CAP.  (Although the patient was hospitalized overnight for recent elective surgery, he was discharged early the next day and I doubt that this is related to his current pneumonia.)  Multifocal distribution is prominent on CXR raising concern for atypical infection or viral infection.  Wheezing. -  F/u Blood cultures  -  MRSA PCR -  Flu PCR -  Sputum culture if able -  S. pneumo and legionella urine antigen -  Ceftriaxone  and azithromycin -  Continuous pulse ox -  Ambulatory pulse ox in AM -  Mucinex  -  Start steroids and albuterol -  Albuterol prn  CAD s/p PCI to LAD in May of this year -  Continue aspirin and plavix -  Continue statin -  Continue ARB  -  Patient is not on beta blocker, unclear why  Hx of aortic valve stenosis s/p TAVR  Bladder  abnormality seen on CT earlier this year -  Urology evaluated and did not find evidence of malignancy  Lost 20 pounds in the last year and now somewhat cachectic appearing -  Nutrition consult -  Regular diet -  supplements  Constipation -  Start miralax, colace, and senna  DVT prophylaxis: lovenox  Code Status: full code Family Communication:  Patient and neighbor/family friend/colleague [name] [name] and his wife.  Patient's nearest relations are his cousins.  No wife or children Disposition Plan: likely to home in a few days  Consults called: none  Admission status: admit to inpatient due to patient's frailty, poor nutritional status, age, hypoxia, and presence of prominent multifocal pneumonia on CXR.  At risk for respiratory decompensation or cardiac event in the setting of CAD.     Janece Canterbury MD [facility] Pager [phone]  If 7PM-7AM, please contact night-coverage [email or web address] Password [facility]  12/01/2016, 5:34 PM

## 2016-12-01 NOTE — ED Notes (Signed)
Eulis Foster, MD made aware of patients status.

## 2016-12-01 NOTE — ED Notes (Signed)
Elizabeth MD made aware of status on pt.

## 2016-12-01 NOTE — ED Triage Notes (Addendum)
Pt reports URI symptoms x10 days, mainly productive cough and SOB. He was sent here for evaluation by [facility]. O2 sats 83% in traige. Does not wear home O2. A&Ox4. No chest pain

## 2016-12-01 NOTE — ED Notes (Signed)
Lung sounds bilaterally were diminished with small expiratory wheezing. Possible rales.

## 2016-12-01 NOTE — ED Notes (Signed)
Report given to [id number].

## 2016-12-01 NOTE — ED Notes (Signed)
0.9% sodium chloride infusing at 170ml/hr

## 2016-12-02 ENCOUNTER — Other Ambulatory Visit (HOSPITAL_COMMUNITY): Payer: BC Managed Care – PPO

## 2016-12-02 DIAGNOSIS — I2510 Atherosclerotic heart disease of native coronary artery without angina pectoris: Secondary | ICD-10-CM

## 2016-12-02 DIAGNOSIS — N323 Diverticulum of bladder: Secondary | ICD-10-CM

## 2016-12-02 DIAGNOSIS — K5900 Constipation, unspecified: Secondary | ICD-10-CM

## 2016-12-02 LAB — BASIC METABOLIC PANEL
Anion gap: 5 (ref 5–15)
BUN: 14 mg/dL (ref 6–20)
CALCIUM: 7.6 mg/dL — AB (ref 8.9–10.3)
CHLORIDE: 101 mmol/L (ref 101–111)
CO2: 27 mmol/L (ref 22–32)
CREATININE: 0.53 mg/dL — AB (ref 0.61–1.24)
GFR calc Af Amer: >60 mL/min (ref >60)
GLUCOSE: 91 mg/dL (ref 65–99)
Potassium: 4 mmol/L (ref 3.5–5.1)
Sodium: 133 mmol/L — ABNORMAL LOW (ref 135–145)

## 2016-12-02 LAB — CBC
HEMATOCRIT: 35.6 % — AB (ref 39.0–52.0)
Hemoglobin: 11.3 g/dL — ABNORMAL LOW (ref 13.0–17.0)
MCH: 28.8 pg (ref 26.0–34.0)
MCHC: 31.7 g/dL (ref 30.0–36.0)
MCV: 90.6 fL (ref 78.0–100.0)
PLATELETS: 329 10*3/uL (ref 150–400)
RBC: 3.93 MIL/uL — ABNORMAL LOW (ref 4.22–5.81)
RDW: 15.1 % (ref 11.5–15.5)
WBC: 12.2 10*3/uL — AB (ref 4.0–10.5)

## 2016-12-02 LAB — HIV ANTIBODY (ROUTINE TESTING W REFLEX): HIV SCREEN 4TH GENERATION: NONREACTIVE

## 2016-12-02 LAB — EXPECTORATED SPUTUM ASSESSMENT W REFEX TO RESP CULTURE

## 2016-12-02 LAB — EXPECTORATED SPUTUM ASSESSMENT W GRAM STAIN, RFLX TO RESP C

## 2016-12-02 LAB — STREP PNEUMONIAE URINARY ANTIGEN: Strep Pneumo Urinary Antigen: NEGATIVE

## 2016-12-02 MED ORDER — ENSURE ENLIVE PO LIQD: 237.0000 mL | ORAL | Status: DC

## 2016-12-02 MED ORDER — BOOST / RESOURCE BREEZE PO LIQD
1.0000 | Freq: Two times a day (BID) | ORAL | Status: DC
  Administered 2016-12-03: 237 mL via ORAL

## 2016-12-02 NOTE — Progress Notes (Signed)
Initial Nutrition Assessment  DOCUMENTATION CODES:   Severe malnutrition in context of chronic illness  INTERVENTION:   -Provide Ensure Enlive po daily, each supplement provides 350 kcal and 20 grams of protein -Provide Boost Breeze po BID, each supplement provides 250 kcal and 9 grams of protein -Encourage PO intake  RD will continue to monitor  NUTRITION DIAGNOSIS:   Severe Malnutrition related to chronic illness(poor appetite following cardiovascular procedures and hip surgery) as evidenced by severe fat depletion, severe muscle depletion.  GOAL:   Patient will meet greater than or equal to 90% of their needs  MONITOR:   PO intake, Supplement acceptance, Labs, Weight trends, I & O's  REASON FOR ASSESSMENT:   Consult Assessment of nutrition requirement/status  ASSESSMENT:   81 y.o. male with history of coronary artery disease status post PCI to the LAD in May 2018, aortic valve stenosis status post TAVR in June 2018, essential hypertension, bladder diverticulum who presents with a 10-day history of cough productive of nonbloody sputum, fatigue, possible low grade fevers, and mild SOB with exertion.   Patient reports poor appetite. Pt has been constipated this past week. Pt reports he has had a BM today. Pt states he makes himself eat 3 meals a day at home. Pt ate an omelette this morning for breakfast. He has drank 1/2 of an Ensure. States it's "okay". Would like to try Boost Breeze instead. RD to order.  Pt reports UBW of 150-160 lb. Per chart review, pt has lost 13 lb since 5/30 (9% wt loss x 6 months, insignificant for time frame).  Medications: Colace capsule BID, Miralax packet daily, Senokot tablet daily Labs reviewed: Low Na   NUTRITION - FOCUSED PHYSICAL EXAM:    Most Recent Value  Orbital Region  Moderate depletion  Upper Arm Region  Severe depletion  Thoracic and Lumbar Region  Unable to assess  Buccal Region  Moderate depletion  Temple Region  Moderate  depletion  Clavicle Bone Region  Moderate depletion  Clavicle and Acromion Bone Region  Moderate depletion  Scapular Bone Region  Unable to assess  Dorsal Hand  Moderate depletion  Patellar Region  Severe depletion  Anterior Thigh Region  Moderate depletion  Posterior Calf Region  Severe depletion  Edema (RD Assessment)  None       Diet Order:  Diet regular Room service appropriate? Yes; Fluid consistency: Thin  EDUCATION NEEDS:   Education needs have been addressed  Skin:  Skin Assessment: Reviewed RN Assessment  Last BM:  11/19  Height:   Ht Readings from Last 1 Encounters:  12/01/16 5\' 9"  (1.753 m)    Weight:   Wt Readings from Last 1 Encounters:  12/01/16 127 lb 13.9 oz (58 kg)    Ideal Body Weight:  72.7 kg  BMI:  Body mass index is 18.88 kg/m.  Estimated Nutritional Needs:   Kcal:  9675-9163  Protein:  80-90g  Fluid:  2L/day  Clayton Bibles, MS, RD, LDN [place] [facility] Pager: [phone] After Hours Pager: [phone]

## 2016-12-02 NOTE — Progress Notes (Signed)
Patient ambulated 200' in the hallway with no O2. At rest, 89% O2 sat on room air. After ambulating, 87% O2 on room air. Patient replaced on 1 L O2 nasal canula

## 2016-12-02 NOTE — Progress Notes (Signed)
PROGRESS NOTE  [name] [name]  ZOX:[id number] DOB: 12-Dec-1935 DOA: 12/01/2016 PCP: Maury Dus, MD  Brief Narrative:   The patient is a 81 year old male with coronary artery disease status post PCI to the LAD in May 2018, aortic valve stenosis status post T AVR in June 2018, essential hypertension,  diverticulum of the bladder who presents with a 10-day history of productive cough, fatigue, possible low-grade fevers, mild shortness of breath with exertion.  He was found to have multifocal pneumonia with a mildly elevated white blood cell count of 14.3.  He was hypoxic and his oxygen saturations were 83% on room air and increased to the mid 90s on nasal cannula.  He was afebrile.  He was started on antibiotics for community-acquired pneumonia and admitted to the hospital for further treatment.  MRSA PCR negative.  Flu PCR negative.  Assessment & Plan:   Active Problems:   Multifocal pneumonia  Acute hypoxic respiratory failure due to CAP.  (Although the patient was hospitalized overnight for recent elective surgery, he was discharged early the next day and I doubt that this is related to his current pneumonia.)  Multifocal distribution is prominent on CXR raising concern for atypical infection or viral infection.  Wheezing. -  Blood cultures no growth to date -  MRSA PCR -  Flu PCR -  Sputum culture to be obtained -  S. pneumo negative and legionella urine antigen pending -  Continue ceftriaxone and azithromycin -  Prednisone day 2 of 5  CAD s/p PCI to LAD in May of this year -  Continue aspirin and plavix -  Continue statin -  Continue ARB  -  Patient is not on beta blocker, unclear why  Hx of aortic valve stenosis s/p TAVR  Bladder abnormality seen on CT earlier this year -  Per patient urology evaluated and did not find evidence of malignancy  Lost 20 pounds in the last year and now somewhat cachectic appearing -  Nutrition consult -  Regular diet -   supplements  Constipation, resolving -  Continue Miralax, colace, and senna  DVT prophylaxis: lovenox  Code Status: full code Family Communication:  Patient alone  Disposition Plan: likely to home in a few days    Consultants:   None  Procedures:  None  Antimicrobials:  Anti-infectives (From admission, onward)   Start     Dose/Rate Route Frequency Ordered Stop   12/02/16 1500  cefTRIAXone (ROCEPHIN) 1 g in dextrose 5 % 50 mL IVPB     1 g 100 mL/hr over 30 Minutes Intravenous Every 24 hours 12/01/16 1800 12/09/16 1459   12/02/16 1500  azithromycin (ZITHROMAX) tablet 500 mg     500 mg Oral Every 24 hours 12/01/16 1800 12/09/16 1459   12/01/16 1500  cefTRIAXone (ROCEPHIN) 1 g in dextrose 5 % 50 mL IVPB     1 g 100 mL/hr over 30 Minutes Intravenous  Once 12/01/16 1454 12/01/16 1548   12/01/16 1500  azithromycin (ZITHROMAX) 500 mg in dextrose 5 % 250 mL IVPB     500 mg 250 mL/hr over 60 Minutes Intravenous  Once 12/01/16 1454 12/01/16 1653       Subjective:  Patient states that he feels well.  He has a mild cough.  He denies shortness of breath but he has not been out of bed to exert himself.  Denies fevers.  Objective: Vitals:   12/01/16 1821 12/01/16 1830 12/01/16 1943 12/02/16 0436  BP: 132/60 (!) 143/70 (!) 152/62  126/62  Pulse: 76 75 80 69  Resp: (!) 23 18 18 16   Temp:   97.9 F (36.6 C) 98 F (36.7 C)  TempSrc:   Oral Oral  SpO2: 96% 96% 98% 94%  Weight:   58 kg (127 lb 13.9 oz)   Height:   5\' 9"  (1.753 m)     Intake/Output Summary (Last 24 hours) at 12/02/2016 1514 Last data filed at 12/02/2016 1235 Gross per 24 hour  Intake 800 ml  Output 600 ml  Net 200 ml   Filed Weights   12/01/16 1943  Weight: 58 kg (127 lb 13.9 oz)    Examination:  General exam: Thin adult male.  No acute distress.  HEENT:  NCAT, MMM Respiratory system: Rales throughout all lung fields, scattered wheezes, no rhonchi Cardiovascular system: Regular rate and rhythm,  normal S1/S2. No murmurs, rubs, gallops or clicks.  Warm extremities Gastrointestinal system: Normal active bowel sounds, soft, nondistended, nontender. MSK:  Normal tone and bulk, no lower extremity edema Neuro:  Grossly intact    Data Reviewed: I have personally reviewed following labs and imaging studies  CBC: Recent Labs  Lab 12/01/16 1424 12/02/16 0420  WBC 14.3* 12.2*  NEUTROABS 9.4*  --   HGB 15.3 11.3*  HCT 46.1 35.6*  MCV 91.1 90.6  PLT 468* 790   Basic Metabolic Panel: Recent Labs  Lab 12/01/16 1424 12/02/16 0420  NA 135 133*  K 4.3 4.0  CL 97* 101  CO2 29 27  GLUCOSE 96 91  BUN 17 14  CREATININE 0.67 0.53*  CALCIUM 9.0 7.6*   GFR: Estimated Creatinine Clearance: 61.4 mL/min (A) (by C-G formula based on SCr of 0.53 mg/dL (L)). Liver Function Tests: No results for input(s): AST, ALT, ALKPHOS, BILITOT, PROT, ALBUMIN in the last 168 hours. No results for input(s): LIPASE, AMYLASE in the last 168 hours. No results for input(s): AMMONIA in the last 168 hours. Coagulation Profile: No results for input(s): INR, PROTIME in the last 168 hours. Cardiac Enzymes: No results for input(s): CKTOTAL, CKMB, CKMBINDEX, TROPONINI in the last 168 hours. BNP (last 3 results) No results for input(s): PROBNP in the last 8760 hours. HbA1C: No results for input(s): HGBA1C in the last 72 hours. CBG: No results for input(s): GLUCAP in the last 168 hours. Lipid Profile: No results for input(s): CHOL, HDL, LDLCALC, TRIG, CHOLHDL, LDLDIRECT in the last 72 hours. Thyroid Function Tests: No results for input(s): TSH, T4TOTAL, FREET4, T3FREE, THYROIDAB in the last 72 hours. Anemia Panel: No results for input(s): VITAMINB12, FOLATE, FERRITIN, TIBC, IRON, RETICCTPCT in the last 72 hours. Urine analysis:    Component Value Date/Time   COLORURINE YELLOW 06/28/2016 1155   APPEARANCEUR CLEAR 06/28/2016 1155   LABSPEC 1.014 06/28/2016 1155   PHURINE 5.0 06/28/2016 1155   GLUCOSEU  NEGATIVE 06/28/2016 1155   HGBUR NEGATIVE 06/28/2016 1155   [place] 06/28/2016 1155   [place] 06/28/2016 1155   PROTEINUR NEGATIVE 06/28/2016 1155   NITRITE NEGATIVE 06/28/2016 1155   LEUKOCYTESUR NEGATIVE 06/28/2016 1155   Sepsis Labs: @LABRCNTIP (procalcitonin:4,lacticidven:4)  ) Recent Results (from the past 240 hour(s))  Culture, blood (routine x 2)     Status: None (Preliminary result)   Collection Time: 12/01/16  2:24 PM  Result Value Ref Range Status   Specimen Description BLOOD RIGHT ANTECUBITAL  Final   Special Requests   Final    BOTTLES DRAWN AEROBIC AND ANAEROBIC Blood Culture adequate volume   Culture   Final  NO GROWTH < 24 HOURS Performed at [facility] [address] [place], [place] [address]    Report Status PENDING  Incomplete  Culture, blood (routine x 2)     Status: None (Preliminary result)   Collection Time: 12/01/16  2:29 PM  Result Value Ref Range Status   Specimen Description BLOOD BLOOD RIGHT HAND  Final   Special Requests IN PEDIATRIC BOTTLE Blood Culture adequate volume  Final   Culture   Final    NO GROWTH < 24 HOURS Performed at [facility], [place] [address], [place], [place] [address]    Report Status PENDING  Incomplete  MRSA PCR Screening     Status: None   Collection Time: 12/01/16  6:52 PM  Result Value Ref Range Status   MRSA by PCR NEGATIVE NEGATIVE Final    Comment:        The GeneXpert MRSA Assay (FDA approved for NASAL specimens only), is one component of a comprehensive MRSA colonization surveillance program. It is not intended to diagnose MRSA infection nor to guide or monitor treatment for MRSA infections.   Culture, sputum-assessment     Status: None   Collection Time: 12/02/16 12:30 PM  Result Value Ref Range Status   Specimen Description EXPECTORATED SPUTUM  Final   Special Requests NONE  Final   Sputum evaluation THIS SPECIMEN IS ACCEPTABLE FOR SPUTUM CULTURE  Final   Report  Status 12/02/2016 FINAL  Final      Radiology Studies: Dg Chest 2 View  Result Date: 12/01/2016 CLINICAL DATA:  Cough and shortness of breath for the past 10 days. EXAM: CHEST  2 VIEW COMPARISON:  Chest x-ray from same date. FINDINGS: The cardiomediastinal silhouette is normal in size. Prior aortic valve replacement. Unchanged patchy airspace opacities throughout the right lung and left upper lobe. No pleural effusion or pneumothorax. No acute osseous abnormality. IMPRESSION: Unchanged patchy bilateral airspace opacities. Electronically Signed   By: Titus Dubin M.D.   On: 12/01/2016 13:36     Scheduled Meds: . azithromycin  500 mg Oral Q24H  . dextromethorphan-guaiFENesin  1 tablet Oral BID  . docusate sodium  100 mg Oral BID  . enoxaparin (LOVENOX) injection  40 mg Subcutaneous Q24H  . feeding supplement  1 Container Oral BID BM  . [START ON 12/03/2016] feeding supplement (ENSURE ENLIVE)  237 mL Oral Q24H  . polyethylene glycol  17 g Oral Daily  . predniSONE  40 mg Oral Q breakfast  . senna  2 tablet Oral QHS   Continuous Infusions: . cefTRIAXone (ROCEPHIN)  IV       LOS: 1 day    Time spent: 30 min    Janece Canterbury, MD [facility] Pager [phone]  If 7PM-7AM, please contact night-coverage [email or web address] Password [email or web address] 12/02/2016, 3:14 PM

## 2016-12-03 LAB — LEGIONELLA PNEUMOPHILA SEROGP 1 UR AG: L. PNEUMOPHILA SEROGP 1 UR AG: NEGATIVE

## 2016-12-03 NOTE — Progress Notes (Signed)
1054 02 SATS while ambulating in the hall  were 88% -90% Pulse was 84.

## 2016-12-03 NOTE — Progress Notes (Signed)
PROGRESS NOTE  [name] [name]  ZHY:[id number] DOB: [birth year] DOA: 12/01/2016 PCP: Maury Dus, MD  Brief Narrative:   The patient is a 81 year old male with coronary artery disease status post PCI to the LAD in May 2018, aortic valve stenosis status post T AVR in June 2018, essential hypertension,  diverticulum of the bladder who presents with a 10-day history of productive cough, fatigue, possible low-grade fevers, mild shortness of breath with exertion.  He was found to have multifocal pneumonia with a mildly elevated white blood cell count of 14.3.  He was hypoxic and his oxygen saturations were 83% on room air and increased to the mid 90s on nasal cannula.  He was afebrile.  He was started on antibiotics for community-acquired pneumonia and admitted to the hospital for further treatment.  MRSA PCR negative.  Flu PCR negative.  He remains hypoxic on room air at rest.  Assessment & Plan:   Principal Problem:   Multifocal pneumonia Active Problems:   Coronary artery disease involving native coronary artery of native heart without angina pectoris   Bladder diverticulum   Constipation  Acute hypoxic respiratory failure due to CAP.    Feeling a little better but still dyspneic when he ambulates to the bathroom. -  Blood cultures no growth to date -  MRSA PCR -  Flu PCR -  Sputum culture pending -  S. pneumo negative and legionella urine antigen pending -  Continue ceftriaxone and azithromycin -  Prednisone day 3 of 5 -  Ambulatory pulse ox  CAD s/p PCI to LAD in May of this year -  Continue aspirin and plavix -  Continue statin -  Continue ARB  -  Patient is not on beta blocker, unclear why  Hx of aortic valve stenosis s/p TAVR  Bladder abnormality seen on CT earlier this year -  Per patient urology evaluated and did not find evidence of malignancy  Severe protein calorie malnutrition  -  Nutrition consult -  Regular diet -  supplements  Constipation, resolved -   Continue Miralax, colace, and senna  DVT prophylaxis: lovenox  Code Status: full code Family Communication:  Patient alone  Disposition Plan: likely to home in a few days.  Patient lives at home alone.    Consultants:   None  Procedures:  None  Antimicrobials:  Anti-infectives (From admission, onward)   Start     Dose/Rate Route Frequency Ordered Stop   12/02/16 1500  cefTRIAXone (ROCEPHIN) 1 g in dextrose 5 % 50 mL IVPB     1 g 100 mL/hr over 30 Minutes Intravenous Every 24 hours 12/01/16 1800 12/09/16 1459   12/02/16 1500  azithromycin (ZITHROMAX) tablet 500 mg     500 mg Oral Every 24 hours 12/01/16 1800 12/09/16 1459   12/01/16 1500  cefTRIAXone (ROCEPHIN) 1 g in dextrose 5 % 50 mL IVPB     1 g 100 mL/hr over 30 Minutes Intravenous  Once 12/01/16 1454 12/01/16 1548   12/01/16 1500  azithromycin (ZITHROMAX) 500 mg in dextrose 5 % 250 mL IVPB     500 mg 250 mL/hr over 60 Minutes Intravenous  Once 12/01/16 1454 12/01/16 1653       Subjective:  Patient states he thinks he is coughing less.  He denies fevers or chills.  He is still Bethanny Toelle of breath when he ambulates to the bathroom.  He was able to walk from his room to the end of the hall yesterday which was about 20  feet.    Objective: Vitals:   12/02/16 1609 12/02/16 2241 12/03/16 0556 12/03/16 1056  BP: 140/67 (!) 136/51 (!) 163/65   Pulse: 80 77 75 84  Resp: 18 20 16    Temp: 98.6 F (37 C) 97.6 F (36.4 C) 97.7 F (36.5 C)   TempSrc: Oral Oral Oral   SpO2: 92% 91% 91% (!) 88%  Weight:      Height:        Intake/Output Summary (Last 24 hours) at 12/03/2016 1109 Last data filed at 12/03/2016 1056 Gross per 24 hour  Intake 240 ml  Output 750 ml  Net -510 ml   Filed Weights   12/01/16 1943  Weight: 58 kg (127 lb 13.9 oz)    Examination:  General exam: Cachectic adult male.  No acute distress.  HEENT:  NCAT, MMM Respiratory system: Decreased wheeze and decreased rales throughout all lung fields, no  rhonchi  cardiovascular system: Regular rate and rhythm, normal S1/S2. No murmurs, rubs, gallops or clicks.  Warm extremities Gastrointestinal system: Normal active bowel sounds, soft, nondistended, nontender. MSK:  Normal tone and bulk, no lower extremity edema Neuro:  Grossly intact    Data Reviewed: I have personally reviewed following labs and imaging studies  CBC: Recent Labs  Lab 12/01/16 1424 12/02/16 0420  WBC 14.3* 12.2*  NEUTROABS 9.4*  --   HGB 15.3 11.3*  HCT 46.1 35.6*  MCV 91.1 90.6  PLT 468* 809   Basic Metabolic Panel: Recent Labs  Lab 12/01/16 1424 12/02/16 0420  NA 135 133*  K 4.3 4.0  CL 97* 101  CO2 29 27  GLUCOSE 96 91  BUN 17 14  CREATININE 0.67 0.53*  CALCIUM 9.0 7.6*   GFR: Estimated Creatinine Clearance: 61.4 mL/min (A) (by C-G formula based on SCr of 0.53 mg/dL (L)). Liver Function Tests: No results for input(s): AST, ALT, ALKPHOS, BILITOT, PROT, ALBUMIN in the last 168 hours. No results for input(s): LIPASE, AMYLASE in the last 168 hours. No results for input(s): AMMONIA in the last 168 hours. Coagulation Profile: No results for input(s): INR, PROTIME in the last 168 hours. Cardiac Enzymes: No results for input(s): CKTOTAL, CKMB, CKMBINDEX, TROPONINI in the last 168 hours. BNP (last 3 results) No results for input(s): PROBNP in the last 8760 hours. HbA1C: No results for input(s): HGBA1C in the last 72 hours. CBG: No results for input(s): GLUCAP in the last 168 hours. Lipid Profile: No results for input(s): CHOL, HDL, LDLCALC, TRIG, CHOLHDL, LDLDIRECT in the last 72 hours. Thyroid Function Tests: No results for input(s): TSH, T4TOTAL, FREET4, T3FREE, THYROIDAB in the last 72 hours. Anemia Panel: No results for input(s): VITAMINB12, FOLATE, FERRITIN, TIBC, IRON, RETICCTPCT in the last 72 hours. Urine analysis:    Component Value Date/Time   COLORURINE YELLOW 06/28/2016 1155   APPEARANCEUR CLEAR 06/28/2016 1155   LABSPEC 1.014  06/28/2016 1155   PHURINE 5.0 06/28/2016 1155   GLUCOSEU NEGATIVE 06/28/2016 1155   HGBUR NEGATIVE 06/28/2016 [place] 06/28/2016 [place] 06/28/2016 1155   PROTEINUR NEGATIVE 06/28/2016 1155   NITRITE NEGATIVE 06/28/2016 1155   LEUKOCYTESUR NEGATIVE 06/28/2016 1155   Sepsis Labs: @LABRCNTIP (procalcitonin:4,lacticidven:4)  ) Recent Results (from the past 240 hour(s))  Culture, blood (routine x 2)     Status: None (Preliminary result)   Collection Time: 12/01/16  2:24 PM  Result Value Ref Range Status   Specimen Description BLOOD RIGHT ANTECUBITAL  Final   Special Requests   Final  BOTTLES DRAWN AEROBIC AND ANAEROBIC Blood Culture adequate volume   Culture   Final    NO GROWTH < 24 HOURS Performed at [facility], [place] [address] [place], [place] [address]    Report Status PENDING  Incomplete  Culture, blood (routine x 2)     Status: None (Preliminary result)   Collection Time: 12/01/16  2:29 PM  Result Value Ref Range Status   Specimen Description BLOOD BLOOD RIGHT HAND  Final   Special Requests IN PEDIATRIC BOTTLE Blood Culture adequate volume  Final   Culture   Final    NO GROWTH < 24 HOURS Performed at [facility], [place] [address] [place], [place] [address]    Report Status PENDING  Incomplete  MRSA PCR Screening     Status: None   Collection Time: 12/01/16  6:52 PM  Result Value Ref Range Status   MRSA by PCR NEGATIVE NEGATIVE Final    Comment:        The GeneXpert MRSA Assay (FDA approved for NASAL specimens only), is one component of a comprehensive MRSA colonization surveillance program. It is not intended to diagnose MRSA infection nor to guide or monitor treatment for MRSA infections.   Culture, sputum-assessment     Status: None   Collection Time: 12/02/16 12:30 PM  Result Value Ref Range Status   Specimen Description EXPECTORATED SPUTUM  Final   Special Requests NONE  Final   Sputum evaluation THIS  SPECIMEN IS ACCEPTABLE FOR SPUTUM CULTURE  Final   Report Status 12/02/2016 FINAL  Final  Culture, respiratory (NON-Expectorated)     Status: None (Preliminary result)   Collection Time: 12/02/16 12:30 PM  Result Value Ref Range Status   Specimen Description EXPECTORATED SPUTUM  Final   Special Requests NONE Reflexed from [id number]  Final   Gram Stain   Final    FEW WBC PRESENT, PREDOMINANTLY PMN RARE GRAM POSITIVE COCCI    Culture   Final    CULTURE REINCUBATED FOR BETTER GROWTH Performed at [facility], [place] [address], [place], [place] [address]    Report Status PENDING  Incomplete      Radiology Studies: Dg Chest 2 View  Result Date: 12/01/2016 CLINICAL DATA:  Cough and shortness of breath for the past 10 days. EXAM: CHEST  2 VIEW COMPARISON:  Chest x-ray from same date. FINDINGS: The cardiomediastinal silhouette is normal in size. Prior aortic valve replacement. Unchanged patchy airspace opacities throughout the right lung and left upper lobe. No pleural effusion or pneumothorax. No acute osseous abnormality. IMPRESSION: Unchanged patchy bilateral airspace opacities. Electronically Signed   By: Titus Dubin M.D.   On: 12/01/2016 13:36     Scheduled Meds: . azithromycin  500 mg Oral Q24H  . dextromethorphan-guaiFENesin  1 tablet Oral BID  . docusate sodium  100 mg Oral BID  . enoxaparin (LOVENOX) injection  40 mg Subcutaneous Q24H  . feeding supplement  1 Container Oral BID BM  . feeding supplement (ENSURE ENLIVE)  237 mL Oral Q24H  . polyethylene glycol  17 g Oral Daily  . predniSONE  40 mg Oral Q breakfast  . senna  2 tablet Oral QHS   Continuous Infusions: . cefTRIAXone (ROCEPHIN)  IV 1 g (12/02/16 1636)     LOS: 2 days    Time spent: 30 min    Janece Canterbury, MD [facility] Pager [phone]  If 7PM-7AM, please contact night-coverage [email or web address] Password [email or web address] 12/03/2016, 11:09 AM

## 2016-12-04 ENCOUNTER — Other Ambulatory Visit: Payer: Self-pay | Admitting: Internal Medicine

## 2016-12-04 DIAGNOSIS — J189 Pneumonia, unspecified organism: Secondary | ICD-10-CM

## 2016-12-04 DIAGNOSIS — R531 Weakness: Secondary | ICD-10-CM

## 2016-12-04 LAB — CULTURE, RESPIRATORY: CULTURE: NORMAL

## 2016-12-04 LAB — CULTURE, RESPIRATORY W GRAM STAIN

## 2016-12-04 MED ORDER — PREDNISONE 20 MG PO TABS: 40.0000 mg | ORAL_TABLET | Freq: Every day | ORAL | 0 refills | Status: AC

## 2016-12-04 MED ORDER — CEFPODOXIME PROXETIL 200 MG PO TABS: 200.0000 mg | ORAL_TABLET | Freq: Two times a day (BID) | ORAL | 0 refills | Status: DC

## 2016-12-04 MED ORDER — DM-GUAIFENESIN ER 30-600 MG PO TB12: 2.0000 | ORAL_TABLET | Freq: Two times a day (BID) | ORAL | 0 refills | Status: DC

## 2016-12-04 MED ORDER — ENSURE ENLIVE PO LIQD: 237.0000 mL | ORAL | 0 refills | Status: DC

## 2016-12-04 MED ORDER — BOOST / RESOURCE BREEZE PO LIQD: 1.0000 | Freq: Two times a day (BID) | ORAL | 0 refills | Status: DC

## 2016-12-04 MED ORDER — AZITHROMYCIN 500 MG PO TABS: 500.0000 mg | ORAL_TABLET | Freq: Every day | ORAL | 0 refills | Status: AC

## 2016-12-04 MED ORDER — DM-GUAIFENESIN ER 30-600 MG PO TB12: 2.0000 | ORAL_TABLET | Freq: Two times a day (BID) | ORAL | Status: DC

## 2016-12-04 NOTE — Discharge Summary (Signed)
Physician Discharge Summary  [name] [name] ZSW:[id number] DOB: February 15, 1935 DOA: 12/01/2016  PCP: Maury Dus, MD  Admit date: 12/01/2016 Discharge date: 12/04/2016  Admitted From: home  Disposition:  home  Recommendations for Outpatient Follow-up:  1. Please repeat CXR in 3-4 weeks to make sure infiltrates are clearing.  If not, [name] [name] will need Pulmonology referral for further work up.   2. Follow-up blood cultures  Home Health:  none  Equipment/Devices:  None  Discharge Condition:  Stable, improved CODE STATUS:  Full code   Diet recommendation:  Regular with supplements   Brief/Interim Summary:  The patient is a 81 year old male with coronary artery disease status post PCI to the LAD in May 2018, aortic valve stenosis status post TAVR in June 2018, essential hypertension, diverticulum of the bladder who presents with a 10-day history of productive cough, fatigue, possible low-grade fevers, mild shortness of breath with exertion.  [name] [name] was found to have multifocal pneumonia with a mildly elevated white blood cell count of 14.3.  [name] [name] was hypoxic and his oxygen saturations were 83% on room air and increased to the mid 90s on nasal cannula.  [name] [name] was afebrile.  [name] [name] was started on antibiotics for community-acquired pneumonia and admitted to the hospital for further treatment.  MRSA PCR negative.  Flu PCR negative.  [name] [name] had improvement in his wheezing and dyspnea over the next few days and was able to ambulate without supplemental oxygen keeping O2 sat > 88% prior to discharge.  Strongly encouraged him to make sure [name] [name] gets a follow up CXR in about a month or sooner if [name] [name] is not feeling better.    Discharge Diagnoses:  Principal Problem:   Multifocal pneumonia Active Problems:   Coronary artery disease involving native coronary artery of native heart without angina pectoris   Bladder diverticulum   Constipation  Acute hypoxic respiratory failure due to CAP.    Patient feeling better and less  dyspneic when [name] [name] gets up to go to the bathroom.  Able to ambulate without supplemental oxygen maintaining oxygen saturations greater than 88% - Blood cultures no growth to date - MRSA PCR negative - Flu PCR negative - Sputum culture  grew normal respiratory flora - S. pneumo negative and legionellaurine antigen negative -  Given ceftriaxone and azithromycin during hospitalization and transitioned to cefpodoxime and oral azithromycin at discharge to complete a 7-day course -  Also given prednisone 40 mg daily to complete a 5-day course -Home health nurse to check oxygen level at home -Follow-up chest x-ray in about a month or sooner if not improving  CADs/p PCI to LAD in May of this year - Continue aspirin and plavix - Continue statin - Continue ARB  - Patient is not on beta blocker, unclear why  Hx of aortic valve stenosis s/p TAVR  Bladder abnormalityseen on CT earlier this year - Per patient urology evaluated and did not find evidence of malignancy  Severe protein calorie malnutrition - Nutrition consult - Regular diet - supplements  Constipation, resolved - Continue Miralax    Discharge Instructions  Discharge Instructions    Call MD for:  difficulty breathing, headache or visual disturbances   Complete by:  As directed    Call MD for:  extreme fatigue   Complete by:  As directed    Call MD for:  persistant dizziness or light-headedness   Complete by:  As directed    Diet general   Complete by:  As directed    Increase activity slowly  Complete by:  As directed        Medication List    TAKE these medications   acetaminophen 500 MG tablet Commonly known as:  TYLENOL Take 1 tablet (500 mg total) by mouth every 6 (six) hours as needed for moderate pain. What changed:    how much to take  when to take this  additional instructions   aspirin EC 81 MG tablet Take 81 mg daily by mouth.   azithromycin 500 MG tablet Commonly known  as:  ZITHROMAX Take 1 tablet (500 mg total) by mouth daily for 4 days.   cefpodoxime 200 MG tablet Commonly known as:  VANTIN Take 1 tablet (200 mg total) by mouth 2 (two) times daily.   cetaphil cream Apply 1 application topically daily.   clopidogrel 75 MG tablet Commonly known as:  PLAVIX Take 1 tablet (75 mg total) by mouth daily.   dextromethorphan-guaiFENesin 30-600 MG 12hr tablet Commonly known as:  MUCINEX DM Take 2 tablets by mouth 2 (two) times daily.   feeding supplement (ENSURE ENLIVE) Liqd Take 237 mLs by mouth daily.   feeding supplement Liqd Take 1 Container by mouth 2 (two) times daily between meals.   latanoprost 0.005 % ophthalmic solution Commonly known as:  XALATAN Place 1 drop into both eyes at bedtime.   losartan 100 MG tablet Commonly known as:  COZAAR Take 100 mg by mouth daily.   pravastatin 40 MG tablet Commonly known as:  PRAVACHOL Take 40 mg by mouth every evening.   predniSONE 20 MG tablet Commonly known as:  DELTASONE Take 2 tablets (40 mg total) by mouth daily with breakfast for 2 days.   timolol 0.5 % ophthalmic solution Commonly known as:  BETIMOL Place 1 drop into both eyes 2 (two) times daily.      Follow-up Information    Maury Dus, MD. Schedule an appointment as soon as possible for a visit in 3 week(s).   Specialty:  Family Medicine Why:  repeat chest x-ray Contact information: [place] [address] [phone]          No Known Allergies  Consultations: None  Procedures/Studies: Dg Chest 2 View  Result Date: 12/01/2016 CLINICAL DATA:  Cough and shortness of breath for the past 10 days. EXAM: CHEST  2 VIEW COMPARISON:  Chest x-ray from same date. FINDINGS: The cardiomediastinal silhouette is normal in size. Prior aortic valve replacement. Unchanged patchy airspace opacities throughout the right lung and left upper lobe. No pleural effusion or pneumothorax. No acute osseous  abnormality. IMPRESSION: Unchanged patchy bilateral airspace opacities. Electronically Signed   By: Titus Dubin M.D.   On: 12/01/2016 13:36     Subjective: Patient states that [name] [name] is feeling a little better.  [name] [name] continues to have some cough.  [name] [name] is still little dyspneic when [name] [name] is ambulating longer distances but [name] [name] is less Missie Gehrig of breath and [name] [name] was when [name] [name] first came to the hospital.  Discharge Exam: Vitals:   12/03/16 2150 12/04/16 0622  BP: 136/60 (!) 148/75  Pulse: 77 65  Resp: 20 16  Temp: 98.2 F (36.8 C) 97.9 F (36.6 C)  SpO2: 90% 91%   Vitals:   12/03/16 1056 12/03/16 1541 12/03/16 2150 12/04/16 0622  BP:  135/68 136/60 (!) 148/75  Pulse: 84 79 77 65  Resp:  18 20 16   Temp:  98.3 F (36.8 C) 98.2 F (36.8 C) 97.9 F (36.6 C)  TempSrc:  Oral Oral Oral  SpO2: (!) 88% 91% 90% 91%  Weight:      Height:        General: Cachectic male, pt is alert, awake, not in acute distress Cardiovascular: RRR, S1/S2 +, no rubs, no gallops Respiratory: Decreased wheezes and rales bilaterally without rhonchi Abdominal: Soft, NT, ND, bowel sounds + Extremities: no edema, no cyanosis    The results of significant diagnostics from this hospitalization (including imaging, microbiology, ancillary and laboratory) are listed below for reference.     Microbiology: Recent Results (from the past 240 hour(s))  Culture, blood (routine x 2)     Status: None (Preliminary result)   Collection Time: 12/01/16  2:24 PM  Result Value Ref Range Status   Specimen Description BLOOD RIGHT ANTECUBITAL  Final   Special Requests   Final    BOTTLES DRAWN AEROBIC AND ANAEROBIC Blood Culture adequate volume   Culture   Final    NO GROWTH 3 DAYS Performed at [facility], [address], [place], [place] [address]    Report Status PENDING  Incomplete  Culture, blood (routine x 2)     Status: None (Preliminary result)   Collection Time: 12/01/16  2:29 PM  Result Value Ref Range Status    Specimen Description BLOOD BLOOD RIGHT HAND  Final   Special Requests IN PEDIATRIC BOTTLE Blood Culture adequate volume  Final   Culture   Final    NO GROWTH 3 DAYS Performed at [facility], [place] [address] [place], [place] [address]    Report Status PENDING  Incomplete  MRSA PCR Screening     Status: None   Collection Time: 12/01/16  6:52 PM  Result Value Ref Range Status   MRSA by PCR NEGATIVE NEGATIVE Final    Comment:        The GeneXpert MRSA Assay (FDA approved for NASAL specimens only), is one component of a comprehensive MRSA colonization surveillance program. It is not intended to diagnose MRSA infection nor to guide or monitor treatment for MRSA infections.   Culture, sputum-assessment     Status: None   Collection Time: 12/02/16 12:30 PM  Result Value Ref Range Status   Specimen Description EXPECTORATED SPUTUM  Final   Special Requests NONE  Final   Sputum evaluation THIS SPECIMEN IS ACCEPTABLE FOR SPUTUM CULTURE  Final   Report Status 12/02/2016 FINAL  Final  Culture, respiratory (NON-Expectorated)     Status: None   Collection Time: 12/02/16 12:30 PM  Result Value Ref Range Status   Specimen Description EXPECTORATED SPUTUM  Final   Special Requests NONE Reflexed from [id number]  Final   Gram Stain   Final    FEW WBC PRESENT, PREDOMINANTLY PMN RARE GRAM POSITIVE COCCI    Culture   Final    Consistent with normal respiratory flora. Performed at [facility], [place] [address], [place], [place] [address]    Report Status 12/04/2016 FINAL  Final     Labs: BNP (last 3 results) No results for input(s): BNP in the last 8760 hours. Basic Metabolic Panel: Recent Labs  Lab 12/01/16 1424 12/02/16 0420  NA 135 133*  K 4.3 4.0  CL 97* 101  CO2 29 27  GLUCOSE 96 91  BUN 17 14  CREATININE 0.67 0.53*  CALCIUM 9.0 7.6*   Liver Function Tests: No results for input(s): AST, ALT, ALKPHOS, BILITOT, PROT, ALBUMIN in the last 168 hours. No results for  input(s): LIPASE, AMYLASE in the last 168 hours.  No results for input(s): AMMONIA in the last 168 hours. CBC: Recent Labs  Lab 12/01/16 1424 12/02/16 0420  WBC 14.3* 12.2*  NEUTROABS 9.4*  --   HGB 15.3 11.3*  HCT 46.1 35.6*  MCV 91.1 90.6  PLT 468* 329   Cardiac Enzymes: No results for input(s): CKTOTAL, CKMB, CKMBINDEX, TROPONINI in the last 168 hours. BNP: Invalid input(s): POCBNP CBG: No results for input(s): GLUCAP in the last 168 hours. D-Dimer No results for input(s): DDIMER in the last 72 hours. Hgb A1c No results for input(s): HGBA1C in the last 72 hours. Lipid Profile No results for input(s): CHOL, HDL, LDLCALC, TRIG, CHOLHDL, LDLDIRECT in the last 72 hours. Thyroid function studies No results for input(s): TSH, T4TOTAL, T3FREE, THYROIDAB in the last 72 hours.  Invalid input(s): FREET3 Anemia work up No results for input(s): VITAMINB12, FOLATE, FERRITIN, TIBC, IRON, RETICCTPCT in the last 72 hours. Urinalysis    Component Value Date/Time   COLORURINE YELLOW 06/28/2016 1155   APPEARANCEUR CLEAR 06/28/2016 1155   LABSPEC 1.014 06/28/2016 1155   PHURINE 5.0 06/28/2016 1155   GLUCOSEU NEGATIVE 06/28/2016 [place] 06/28/2016 [place] 06/28/2016 [place] 06/28/2016 1155   PROTEINUR NEGATIVE 06/28/2016 1155   NITRITE NEGATIVE 06/28/2016 1155   LEUKOCYTESUR NEGATIVE 06/28/2016 1155   Sepsis Labs Invalid input(s): PROCALCITONIN,  WBC,  LACTICIDVEN   Time coordinating discharge: Over 30 minutes  SIGNED:   Janece Canterbury, MD  [facility] 12/04/2016, 6:30 PM Pager   If 7PM-7AM, please contact night-coverage [email or web address] Password [email or web address]

## 2016-12-04 NOTE — Progress Notes (Signed)
[name] [name] was seen this morning on rounds and was discharged to home around 11AM.  He subsequently worked with physical therapy who recommended [facility], but I did not realize this until this evening while dictating the discharge summary.  Spoke with Case management who will call the patient on Friday to set up home health services.  Patient has friends who are assisting him at home in the meantime.

## 2016-12-04 NOTE — Evaluation (Signed)
Physical Therapy Evaluation Patient Details Name: [name] [name] MRN: [id number] DOB: 08-Mar-1935 Today's Date: 12/04/2016   History of Present Illness  Pt admitted with SOB 2* PNA and with hx of MVP, CAD, and recent L [facility] 10/18  Clinical Impression  Pt admitted as above and presenting with functional mobility limitations 2* generalized weakness and ambulatory balance deficits.  Pt plans dc home with intermittent assist of friends/family.  This am, pt ambulated 600' with no c/o dizziness or SOB.    Follow Up Recommendations Home health PT    Equipment Recommendations  None recommended by PT    Recommendations for Other Services       Precautions / Restrictions Precautions Precautions: Fall Precaution Comments: Recent L THR.  Pt states "I'm not sure I have my balance back yet" Restrictions Weight Bearing Restrictions: No      Mobility  Bed Mobility Overal bed mobility: Modified Independent             General bed mobility comments: Pt to/from EOB unassisted  Transfers Overall transfer level: Needs assistance   Transfers: Sit to/from Stand Sit to Stand: Min guard         General transfer comment: cues for use of UEs to self assist.  Min guard to steady on initial rising  Ambulation/Gait Ambulation/Gait assistance: Min assist;Min guard Ambulation Distance (Feet): 600 Feet Assistive device: None Gait Pattern/deviations: Step-through pattern;Shuffle;Trunk flexed;Antalgic Gait velocity: decr Gait velocity interpretation: Below normal speed for age/gender General Gait Details: Mildly antalgic gair favoring L LE.  Pt intermittently reaqching for rail to steady - states I dont use cane or RW at home but sometimes I use the wall  Stairs            Wheelchair Mobility    Modified Rankin (Stroke Patients Only)       Balance Overall balance assessment: Needs assistance Sitting-balance support: No upper extremity supported;Feet supported Sitting  balance-Leahy Scale: Good     Standing balance support: No upper extremity supported Standing balance-Leahy Scale: Fair                               Pertinent Vitals/Pain Pain Assessment: No/denies pain    Home Living Family/patient expects to be discharged to:: Private residence Living Arrangements: Alone Available Help at Discharge: Available PRN/intermittently;Family;Friend(s) Type of Home: House Home Access: Level entry     Home Layout: One level Home Equipment: [place] - 2 wheels;Cane - single point      Prior Function Level of Independence: Independent         Comments: Pt states he has stopped using RW and cane     Hand Dominance        Extremity/Trunk Assessment   Upper Extremity Assessment Upper Extremity Assessment: Generalized weakness    Lower Extremity Assessment Lower Extremity Assessment: Generalized weakness       Communication   Communication: No difficulties  Cognition Arousal/Alertness: Awake/alert Behavior During Therapy: WFL for tasks assessed/performed Overall Cognitive Status: Within Functional Limits for tasks assessed                                        General Comments      Exercises     Assessment/Plan    PT Assessment Patient needs continued PT services  PT Problem List Decreased activity tolerance;Decreased balance;Decreased mobility;Decreased strength  PT Treatment Interventions DME instruction;Gait training;Stair training;Functional mobility training;Therapeutic activities;Therapeutic exercise;Balance training;Patient/family education    PT Goals (Current goals can be found in the Care Plan section)  Acute Rehab PT Goals Patient Stated Goal: Regain IND and return home PT Goal Formulation: With patient Time For Goal Achievement: 12/18/16 Potential to Achieve Goals: Good    Frequency Min 3X/week   Barriers to discharge        Co-evaluation               AM-PAC  PT "6 Clicks" Daily Activity  Outcome Measure Difficulty turning over in bed (including adjusting bedclothes, sheets and blankets)?: A Little Difficulty moving from lying on back to sitting on the side of the bed? : A Little Difficulty sitting down on and standing up from a chair with arms (e.g., wheelchair, bedside commode, etc,.)?: A Lot Help needed moving to and from a bed to chair (including a wheelchair)?: A Little Help needed walking in hospital room?: A Little Help needed climbing 3-5 steps with a railing? : A Little 6 Click Score: 17    End of Session Equipment Utilized During Treatment: Gait belt Activity Tolerance: Patient tolerated treatment well Patient left: in bed;with call bell/phone within reach Nurse Communication: Mobility status PT Visit Diagnosis: Unsteadiness on feet (R26.81);Difficulty in walking, not elsewhere classified (R26.2)    Time: 0071-2197 PT Time Calculation (min) (ACUTE ONLY): 15 min   Charges:   PT Evaluation $PT Eval Low Complexity: 1 Low     PT G Codes:        Pg [phone]   Ethie Curless 12/04/2016, 1:05 PM

## 2016-12-06 ENCOUNTER — Telehealth: Payer: Self-pay | Admitting: Emergency Medicine

## 2016-12-06 LAB — CULTURE, BLOOD (ROUTINE X 2)
CULTURE: NO GROWTH
Culture: NO GROWTH
Special Requests: ADEQUATE
Special Requests: ADEQUATE

## 2016-12-09 NOTE — Telephone Encounter (Signed)
CM consulted for RN and PT HHS late on 12/04/2016 after CM had left the hospital.  Called pt on 12/06/2016 x 2 leaving a VM for the pt to return my call.  Pt returned call on 12/09/2016.  Pt requesting [facility] for services.  Contacted Tim who accepted the pt.  No further CM needs noted at this time.

## 2016-12-09 NOTE — Care Management Note (Signed)
Case Management Note  Patient Details  Name: [name] [name] MRN: [id number] Date of Birth: 11-Apr-1935   Expected Discharge Date:  12/04/16               Expected Discharge Plan:  [place]  Discharge planning Services  CM Consult  Post Acute Care Choice:  Home Health Choice offered to:  Patient  HH Arranged:  RN, PT [place] Agency:  [facility] ([facility]  Status of Service:  Completed, signed off  Rae Mar, RN 12/09/2016, 11:03 AM

## 2016-12-24 ENCOUNTER — Other Ambulatory Visit (HOSPITAL_COMMUNITY): Payer: BC Managed Care – PPO

## 2017-01-13 ENCOUNTER — Telehealth (HOSPITAL_COMMUNITY): Payer: Self-pay | Admitting: Cardiology

## 2017-01-13 NOTE — Telephone Encounter (Signed)
12/10 and 12/11 [facility] office closed due to weather evd 11/19 appt was cancelled evd 12/20 [facility] to reschedule echo evd 01/08/17 [facility] to reschedule echo evd 01/13/17 Called pt and lmsg for him to CB to r/s echo.Marland KitchenRG

## 2017-01-15 ENCOUNTER — Encounter (HOSPITAL_COMMUNITY): Payer: Self-pay | Admitting: Radiology

## 2017-01-17 ENCOUNTER — Telehealth (HOSPITAL_COMMUNITY): Payer: Self-pay | Admitting: Cardiology

## 2017-01-17 NOTE — Telephone Encounter (Signed)
12/10 and 12/11 [facility] office closed due to weather evd 11/19 appt was cancelled evd 12/20 [facility] to reschedule echo evd 01/08/17 [facility]mom to reschedule echo evd 01/13/17 Called pt and lmsg for him to CB to r/s echo.Vassie Moment 01/15/17 letter mailed EVD

## 2017-01-20 DIAGNOSIS — J301 Allergic rhinitis due to pollen: Secondary | ICD-10-CM | POA: Diagnosis not present

## 2017-01-20 DIAGNOSIS — J3081 Allergic rhinitis due to animal (cat) (dog) hair and dander: Secondary | ICD-10-CM | POA: Diagnosis not present

## 2017-01-20 DIAGNOSIS — J3089 Other allergic rhinitis: Secondary | ICD-10-CM | POA: Diagnosis not present

## 2017-01-21 ENCOUNTER — Ambulatory Visit
Admission: RE | Admit: 2017-01-21 | Discharge: 2017-01-21 | Disposition: A | Discharge status: Home / Self Care | Payer: BC Managed Care – PPO | Source: Ambulatory Visit | Attending: Family Medicine | Admitting: Family Medicine

## 2017-01-21 ENCOUNTER — Other Ambulatory Visit: Payer: Self-pay | Admitting: Family Medicine

## 2017-01-21 DIAGNOSIS — J181 Lobar pneumonia, unspecified organism: Principal | ICD-10-CM

## 2017-01-21 DIAGNOSIS — J189 Pneumonia, unspecified organism: Secondary | ICD-10-CM

## 2017-01-28 ENCOUNTER — Other Ambulatory Visit: Payer: Self-pay | Admitting: Cardiovascular Disease

## 2017-01-28 DIAGNOSIS — I2510 Atherosclerotic heart disease of native coronary artery without angina pectoris: Secondary | ICD-10-CM

## 2017-01-28 DIAGNOSIS — I350 Nonrheumatic aortic (valve) stenosis: Secondary | ICD-10-CM

## 2017-01-28 NOTE — Telephone Encounter (Signed)
Medication Detail    Disp Refills Start End   clopidogrel (PLAVIX) 75 MG tablet 30 tablet 11 07/25/2016 07/25/2017   Sig - Route: Take 1 tablet (75 mg total) by mouth daily. - Oral   Sent to pharmacy as: clopidogrel (PLAVIX) 75 MG tablet   E-Prescribing Status: Receipt confirmed by pharmacy (07/25/2016 11:15 AM EDT)                Associated Diagnoses   Coronary artery disease involving native coronary artery of native heart without angina pectoris - Primary     Aortic valve stenosis, etiology of cardiac valve disease unspecified     Pharmacy   [place], [address]

## 2017-01-30 ENCOUNTER — Ambulatory Visit (HOSPITAL_COMMUNITY): Payer: BC Managed Care – PPO | Attending: Cardiovascular Disease

## 2017-01-30 ENCOUNTER — Other Ambulatory Visit: Payer: Self-pay

## 2017-01-30 DIAGNOSIS — I313 Pericardial effusion (noninflammatory): Secondary | ICD-10-CM | POA: Diagnosis not present

## 2017-01-30 DIAGNOSIS — Z952 Presence of prosthetic heart valve: Secondary | ICD-10-CM | POA: Insufficient documentation

## 2017-01-30 DIAGNOSIS — I2510 Atherosclerotic heart disease of native coronary artery without angina pectoris: Secondary | ICD-10-CM | POA: Diagnosis not present

## 2017-01-30 DIAGNOSIS — I083 Combined rheumatic disorders of mitral, aortic and tricuspid valves: Secondary | ICD-10-CM | POA: Diagnosis not present

## 2017-01-30 LAB — ECHOCARDIOGRAM COMPLETE
AO mean calculated velocity dopler: 126 cm/s
AOPV: 0.46 m/s
AV Area VTI index: 1.11 cm2/m2
AV Area VTI: 1.6 cm2
AV Area mean vel: 1.73 cm2
AV VEL mean LVOT/AV: 0.5
AV area mean vel ind: 1.03 cm2/m2
AV peak Index: 0.96
AVA: 1.85 cm2
AVG: 8 mmHg
AVPG: 17 mmHg
AVPHT: 463 ms
AVPKVEL: 205 cm/s
Area-P 1/2: 2.78 cm2
CHL CUP AV VALUE AREA INDEX: 1.11
CHL CUP AV VEL: 1.85
CHL CUP DOP CALC LVOT VTI: 24.1 cm
CHL CUP TV REG PEAK VELOCITY: 286 cm/s
E decel time: 271 msec
EERAT: 12.84
FS: 39 % (ref 28–44)
IVS/LV PW RATIO, ED: 0.85
LA diam index: 2.04 cm/m2
LASIZE: 34 mm
LAVOL: 49.4 mL
LAVOLA4C: 46.7 mL
LAVOLIN: 29.6 mL/m2
LEFT ATRIUM END SYS DIAM: 34 mm
LV TDI E'MEDIAL: 4.46
LVEEAVG: 12.84
LVEEMED: 12.84
LVELAT: 7.29 cm/s
LVOT area: 3.46 cm2
LVOT peak VTI: 0.53 cm
LVOT peak vel: 94.9 cm/s
LVOTD: 21 mm
LVOTSV: 83 mL
MV Dec: 271
MV Peak grad: 4 mmHg
MV pk A vel: 108 m/s
MV pk E vel: 93.6 m/s
P 1/2 time: 79 ms
PW: 13 mm — AB (ref 0.6–1.1)
Peak grad: 286 mmHg
RV LATERAL S' VELOCITY: 11.4 cm/s
RV sys press: 36 mmHg
TAPSE: 28.5 mm
TDI e' lateral: 7.29
TR max vel: 286 cm/s
VTI: 45.1 cm

## 2017-02-01 ENCOUNTER — Other Ambulatory Visit: Payer: Self-pay | Admitting: Family Medicine

## 2017-02-01 DIAGNOSIS — R918 Other nonspecific abnormal finding of lung field: Secondary | ICD-10-CM

## 2017-02-14 ENCOUNTER — Other Ambulatory Visit: Payer: Self-pay | Admitting: Family Medicine

## 2017-02-14 DIAGNOSIS — K863 Pseudocyst of pancreas: Secondary | ICD-10-CM

## 2017-02-18 ENCOUNTER — Other Ambulatory Visit: Payer: BC Managed Care – PPO

## 2017-02-20 ENCOUNTER — Ambulatory Visit (INDEPENDENT_AMBULATORY_CARE_PROVIDER_SITE_OTHER): Payer: BC Managed Care – PPO | Admitting: Family Medicine

## 2017-02-20 ENCOUNTER — Encounter: Payer: Self-pay | Admitting: Family Medicine

## 2017-02-20 DIAGNOSIS — E440 Moderate protein-calorie malnutrition: Secondary | ICD-10-CM | POA: Diagnosis not present

## 2017-02-20 NOTE — Patient Instructions (Addendum)
-   Check with Dr. Alyson Ingles about exercise:  If approved, walk in the neighborhood as tolerated, and do your sit-to-stand exercises DAILY.  (And continue rehab exercises 3-4 X wk.)   - Once you start walking, make sure you get some good snack (like one of your nutritional drinks) right after exercising.    - Experiment with the higher-calorie nutritional drinks such as Ensure Plus.    - Try the [employer] or [employer] brand.    - You may want to also try Orgain protein drink. Although it will be lower in calories than the Ensure, you could mix the protein powder into 12 oz whole milk to boost the calorie content.  Recommendations:  1. Continue eating 3 real meals each day AND make sure you get at least two snacks daily.    - A REAL meal includes a source of protein, starch, and fruit and/or vegetables.    (Another definition is the Guest Test = Would you serve this to a guest in your home, and call it a meal?) 2. Make sure at each meal you have some source of healthy fat.  For example, this could be any nut butter, any nuts and seeds, olive oil, avocado, cheese.      - Ways to increase calories without adding a lot of volume:   - Add sunflower or pumpkin seeds or chopped nuts.    - Have some juice at least every day.    - Any time you have a sandwich, aim for at least 2 oz of meat/fish.  (Sandwich ideas: roast beef, ham, tuna, [place], egg salad, cheese, peanut butter).   - Condiments: Use olive oil mayo, salad dressings liberally.    - Nutritional drinks:  On cold days, heat the chocolate one in a mug (+ hot chocolate).    - Weight gain will be very helpful in maintaining your health.  Getting more calories that includes plenty of protein is especially crucial to your being able to increase muscle mass and strength.    - Follow-up:  See what your weight is at your appt with Dr. Alyson Ingles on 2/19.  If no gain or if you've lost, call for a follow-up appt.  Otherwise, feel free to call [phone]) any time  with questions or to schedule as needed.  (Or email: [email or web address].)

## 2017-02-20 NOTE — Progress Notes (Signed)
Medical Nutrition Therapy:  Appt start time: 1430 end time:  [id number]. PCP [name] Dus, MD; [name]  Assessment:  Primary concerns today: malnutrition, moderate severity.  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  He lives alone; relies on simple-to-prepare or frozen meals.  He was referred by Dr. Alyson [name] for moderate malnutrition.  Dr. Deirdre [name] had aortic valve replacement in June 2018, hip replacement in October, and was hospitalized with pneumonia in November 2018.  His weight has declined from 137 on 07-04-16 to 120 today.    Learning Readiness: Ready  Usual eating pattern includes 3 meals and 0-1 snack per day. Frequent foods and beverages include Fiber One, raisins, banana, raisins, whole milk, citrus juice, hard-boiled egg, water, chs/meat, sandwich, yogurt, ice cream.  Avoided foods include potatoes (usually).   Usual physical activity includes little currently; often uses handicap parking; does ~15 min hip replacement rehab exercises 3-4 X wk, and although he has walked up to 5 min in neighborhood, this has been only a couple of times.    24-hr recall: (Up at 6:15 AM) B (6:30 AM)-   1/2 c Fiber One, 1/4 c raisins, 2-3 prunes, 1/2 banana, 1 c whole milk Snk (10 AM)-   1 hard-boiled egg, 8 oz Boost, water L (1 PM)-  1 baloney sandwich, 5 oz Yoplait yogurt, water Snk (4 PM)-  8 oz Ensure D (6 PM)-  2 oz sausage, mixed veg's, water Snk ( PM)-  1 ice cream sandwich  Typical day? Yes.    Progress Towards Goal(s):  In progress.   Nutritional Diagnosis:  [name]-3.1 Underweight As related to recent medical challenges.  As evidenced by weight loss of at least 15 lb in past 7 months.    Intervention:  Nutrition education.  Handouts given during visit include:   After-Visit Summary (AVS)  Goals Sheet   Barriers to learning/adherence to lifestyle change: Poor appetite; lives alone.    Monitoring/Evaluation:  Dietary intake, exercise, and body weight  prn.

## 2017-02-21 ENCOUNTER — Other Ambulatory Visit: Payer: BC Managed Care – PPO

## 2017-02-21 ENCOUNTER — Ambulatory Visit
Admission: RE | Admit: 2017-02-21 | Discharge: 2017-02-21 | Disposition: A | Discharge status: Home / Self Care | Payer: BC Managed Care – PPO | Source: Ambulatory Visit | Attending: Family Medicine | Admitting: Family Medicine

## 2017-02-21 DIAGNOSIS — R918 Other nonspecific abnormal finding of lung field: Secondary | ICD-10-CM

## 2017-02-26 ENCOUNTER — Other Ambulatory Visit: Payer: Self-pay | Admitting: Family Medicine

## 2017-02-26 DIAGNOSIS — R918 Other nonspecific abnormal finding of lung field: Secondary | ICD-10-CM

## 2017-03-14 ENCOUNTER — Other Ambulatory Visit: Payer: Self-pay | Admitting: Family Medicine

## 2017-03-14 DIAGNOSIS — K863 Pseudocyst of pancreas: Secondary | ICD-10-CM

## 2017-03-20 ENCOUNTER — Other Ambulatory Visit: Payer: BC Managed Care – PPO

## 2017-03-31 ENCOUNTER — Other Ambulatory Visit: Payer: Self-pay | Admitting: Family Medicine

## 2017-03-31 DIAGNOSIS — K869 Disease of pancreas, unspecified: Secondary | ICD-10-CM

## 2017-04-15 ENCOUNTER — Ambulatory Visit
Admission: RE | Admit: 2017-04-15 | Discharge: 2017-04-15 | Disposition: A | Discharge status: Home / Self Care | Payer: BC Managed Care – PPO | Source: Ambulatory Visit | Attending: Family Medicine | Admitting: Family Medicine

## 2017-04-15 DIAGNOSIS — K869 Disease of pancreas, unspecified: Secondary | ICD-10-CM

## 2017-04-15 MED ORDER — IOPAMIDOL (ISOVUE-300) INJECTION 61%
100.0000 mL | Freq: Once | INTRAVENOUS | Status: AC | PRN
  Administered 2017-04-15: 100 mL via INTRAVENOUS

## 2017-04-23 ENCOUNTER — Emergency Department (HOSPITAL_COMMUNITY): Payer: BC Managed Care – PPO

## 2017-04-23 ENCOUNTER — Encounter (HOSPITAL_COMMUNITY)
Admission: EM | Disposition: A | Discharge status: Home / Self Care | Payer: Self-pay | Source: Home / Self Care | Attending: Emergency Medicine

## 2017-04-23 ENCOUNTER — Observation Stay (HOSPITAL_COMMUNITY)
Admission: EM | Admit: 2017-04-23 | Discharge: 2017-04-25 | Disposition: A | Discharge status: Home / Self Care | Payer: BC Managed Care – PPO | Attending: Family Medicine | Admitting: Family Medicine

## 2017-04-23 ENCOUNTER — Emergency Department (HOSPITAL_BASED_OUTPATIENT_CLINIC_OR_DEPARTMENT_OTHER): Payer: BC Managed Care – PPO

## 2017-04-23 ENCOUNTER — Other Ambulatory Visit: Payer: Self-pay

## 2017-04-23 ENCOUNTER — Encounter (HOSPITAL_COMMUNITY): Payer: Self-pay | Admitting: Emergency Medicine

## 2017-04-23 DIAGNOSIS — I4891 Unspecified atrial fibrillation: Secondary | ICD-10-CM | POA: Diagnosis not present

## 2017-04-23 DIAGNOSIS — I3139 Other pericardial effusion (noninflammatory): Secondary | ICD-10-CM | POA: Diagnosis present

## 2017-04-23 DIAGNOSIS — I480 Paroxysmal atrial fibrillation: Secondary | ICD-10-CM | POA: Diagnosis present

## 2017-04-23 DIAGNOSIS — I350 Nonrheumatic aortic (valve) stenosis: Secondary | ICD-10-CM

## 2017-04-23 DIAGNOSIS — I2510 Atherosclerotic heart disease of native coronary artery without angina pectoris: Secondary | ICD-10-CM | POA: Diagnosis not present

## 2017-04-23 DIAGNOSIS — Z96642 Presence of left artificial hip joint: Secondary | ICD-10-CM | POA: Insufficient documentation

## 2017-04-23 DIAGNOSIS — R55 Syncope and collapse: Secondary | ICD-10-CM | POA: Diagnosis not present

## 2017-04-23 DIAGNOSIS — Z952 Presence of prosthetic heart valve: Secondary | ICD-10-CM | POA: Insufficient documentation

## 2017-04-23 DIAGNOSIS — I361 Nonrheumatic tricuspid (valve) insufficiency: Secondary | ICD-10-CM | POA: Diagnosis not present

## 2017-04-23 DIAGNOSIS — R42 Dizziness and giddiness: Secondary | ICD-10-CM

## 2017-04-23 DIAGNOSIS — I313 Pericardial effusion (noninflammatory): Secondary | ICD-10-CM | POA: Diagnosis not present

## 2017-04-23 DIAGNOSIS — E43 Unspecified severe protein-calorie malnutrition: Secondary | ICD-10-CM

## 2017-04-23 DIAGNOSIS — I10 Essential (primary) hypertension: Secondary | ICD-10-CM | POA: Diagnosis not present

## 2017-04-23 DIAGNOSIS — R05 Cough: Secondary | ICD-10-CM

## 2017-04-23 DIAGNOSIS — I318 Other specified diseases of pericardium: Secondary | ICD-10-CM | POA: Diagnosis not present

## 2017-04-23 DIAGNOSIS — R0602 Shortness of breath: Secondary | ICD-10-CM | POA: Diagnosis present

## 2017-04-23 DIAGNOSIS — R059 Cough, unspecified: Secondary | ICD-10-CM | POA: Insufficient documentation

## 2017-04-23 HISTORY — DX: Dizziness and giddiness: R42

## 2017-04-23 LAB — BASIC METABOLIC PANEL
Anion gap: 13 (ref 5–15)
BUN: 27 mg/dL — AB (ref 6–20)
CALCIUM: 8.9 mg/dL (ref 8.9–10.3)
CO2: 24 mmol/L (ref 22–32)
CREATININE: 0.87 mg/dL (ref 0.61–1.24)
Chloride: 98 mmol/L — ABNORMAL LOW (ref 101–111)
GFR calc Af Amer: >60 mL/min (ref >60)
GFR calc non Af Amer: >60 mL/min (ref >60)
Glucose, Bld: 110 mg/dL — ABNORMAL HIGH (ref 65–99)
Potassium: 4.2 mmol/L (ref 3.5–5.1)
Sodium: 135 mmol/L (ref 135–145)

## 2017-04-23 LAB — CBC WITH DIFFERENTIAL/PLATELET
BASOS PCT: 1 %
Basophils Absolute: 0.1 10*3/uL (ref 0.0–0.1)
EOS ABS: 0.1 10*3/uL (ref 0.0–0.7)
EOS PCT: 1 %
HCT: 43.9 % (ref 39.0–52.0)
Hemoglobin: 14 g/dL (ref 13.0–17.0)
Lymphocytes Relative: 8 %
Lymphs Abs: 1 10*3/uL (ref 0.7–4.0)
MCH: 29.5 pg (ref 26.0–34.0)
MCHC: 31.9 g/dL (ref 30.0–36.0)
MCV: 92.4 fL (ref 78.0–100.0)
MONO ABS: 1.7 10*3/uL — AB (ref 0.1–1.0)
MONOS PCT: 13 %
NEUTROS PCT: 77 %
Neutro Abs: 10.1 10*3/uL — ABNORMAL HIGH (ref 1.7–7.7)
PLATELETS: 228 10*3/uL (ref 150–400)
RBC: 4.75 MIL/uL (ref 4.22–5.81)
RDW: 15.1 % (ref 11.5–15.5)
WBC: 12.9 10*3/uL — ABNORMAL HIGH (ref 4.0–10.5)

## 2017-04-23 LAB — I-STAT TROPONIN, ED: TROPONIN I, POC: 0.02 ng/mL (ref 0.00–0.08)

## 2017-04-23 LAB — ECHOCARDIOGRAM COMPLETE
Height: 69 in
Weight: 2019.2 oz

## 2017-04-23 LAB — MAGNESIUM: Magnesium: 2 mg/dL (ref 1.7–2.4)

## 2017-04-23 LAB — TROPONIN I

## 2017-04-23 SURGERY — LEFT HEART CATH AND CORONARY ANGIOGRAPHY
Anesthesia: LOCAL

## 2017-04-23 MED ORDER — BOOST / RESOURCE BREEZE PO LIQD CUSTOM
1.0000 | Freq: Two times a day (BID) | ORAL | Status: DC
  Administered 2017-04-24: 1 via ORAL

## 2017-04-23 MED ORDER — SODIUM CHLORIDE 0.9 % IV BOLUS
1000.0000 mL | Freq: Once | INTRAVENOUS | Status: AC
  Administered 2017-04-23: 1000 mL via INTRAVENOUS

## 2017-04-23 MED ORDER — ACETAMINOPHEN 500 MG PO TABS
1000.0000 mg | ORAL_TABLET | Freq: Every day | ORAL | Status: DC
  Administered 2017-04-23 – 2017-04-24 (×2): 1000 mg via ORAL
  Filled 2017-04-23 (×2): qty 2

## 2017-04-23 MED ORDER — ACETAMINOPHEN 500 MG PO TABS: 500.0000 mg | ORAL_TABLET | Freq: Two times a day (BID) | ORAL | Status: DC

## 2017-04-23 MED ORDER — ONDANSETRON HCL 4 MG/2ML IJ SOLN: 4.0000 mg | Freq: Four times a day (QID) | INTRAMUSCULAR | Status: DC | PRN

## 2017-04-23 MED ORDER — NIVEA EX CREA
1.0000 "application " | TOPICAL_CREAM | Freq: Every day | CUTANEOUS | Status: DC
  Filled 2017-04-23: qty 120

## 2017-04-23 MED ORDER — ASPIRIN 81 MG PO CHEW
324.0000 mg | CHEWABLE_TABLET | Freq: Once | ORAL | Status: AC
  Administered 2017-04-23: 324 mg via ORAL
  Filled 2017-04-23: qty 4

## 2017-04-23 MED ORDER — DEXTROMETHORPHAN POLISTIREX ER 30 MG/5ML PO SUER
30.0000 mg | ORAL | Status: DC | PRN
  Administered 2017-04-23 – 2017-04-25 (×6): 30 mg via ORAL
  Filled 2017-04-23 (×10): qty 5

## 2017-04-23 MED ORDER — ENSURE ENLIVE PO LIQD
237.0000 mL | ORAL | Status: DC
  Administered 2017-04-23 – 2017-04-24 (×2): 237 mL via ORAL

## 2017-04-23 MED ORDER — TIMOLOL MALEATE 0.5 % OP SOLN
1.0000 [drp] | Freq: Two times a day (BID) | OPHTHALMIC | Status: DC
  Administered 2017-04-23 – 2017-04-25 (×4): 1 [drp] via OPHTHALMIC
  Filled 2017-04-23: qty 5

## 2017-04-23 MED ORDER — TIMOLOL HEMIHYDRATE 0.5 % OP SOLN: 1.0000 [drp] | Freq: Two times a day (BID) | OPHTHALMIC | Status: DC

## 2017-04-23 MED ORDER — IPRATROPIUM BROMIDE 0.02 % IN SOLN
0.5000 mg | Freq: Three times a day (TID) | RESPIRATORY_TRACT | Status: DC
  Administered 2017-04-23 – 2017-04-24 (×2): 0.5 mg via RESPIRATORY_TRACT
  Filled 2017-04-23 (×2): qty 2.5

## 2017-04-23 MED ORDER — HYDROCERIN EX CREA
TOPICAL_CREAM | Freq: Every day | CUTANEOUS | Status: DC
  Administered 2017-04-23: 1 via TOPICAL
  Administered 2017-04-24 – 2017-04-25 (×2): via TOPICAL
  Filled 2017-04-23: qty 113

## 2017-04-23 MED ORDER — OFF THE BEAT BOOK
Freq: Once | Status: AC
  Administered 2017-04-23: 21:00:00
  Filled 2017-04-23: qty 1

## 2017-04-23 MED ORDER — CLOPIDOGREL BISULFATE 75 MG PO TABS
75.0000 mg | ORAL_TABLET | Freq: Every day | ORAL | Status: DC
  Administered 2017-04-24: 75 mg via ORAL
  Filled 2017-04-23: qty 1

## 2017-04-23 MED ORDER — PRAVASTATIN SODIUM 40 MG PO TABS
40.0000 mg | ORAL_TABLET | Freq: Every evening | ORAL | Status: DC
  Administered 2017-04-23 – 2017-04-24 (×2): 40 mg via ORAL
  Filled 2017-04-23 (×2): qty 1

## 2017-04-23 MED ORDER — ACETAMINOPHEN 500 MG PO TABS
500.0000 mg | ORAL_TABLET | Freq: Every day | ORAL | Status: DC
  Administered 2017-04-24 – 2017-04-25 (×2): 500 mg via ORAL
  Filled 2017-04-23 (×2): qty 1

## 2017-04-23 MED ORDER — ASPIRIN EC 81 MG PO TBEC
81.0000 mg | DELAYED_RELEASE_TABLET | Freq: Every day | ORAL | Status: DC
  Administered 2017-04-24 – 2017-04-25 (×2): 81 mg via ORAL
  Filled 2017-04-23 (×2): qty 1

## 2017-04-23 MED ORDER — LATANOPROST 0.005 % OP SOLN
1.0000 [drp] | Freq: Every day | OPHTHALMIC | Status: DC
  Administered 2017-04-23 – 2017-04-24 (×2): 1 [drp] via OPHTHALMIC
  Filled 2017-04-23: qty 2.5

## 2017-04-23 NOTE — ED Notes (Signed)
Activated code stemi per MD Regenia Skeeter spoke with doug from care [facility] Dr. Radford Pax is patients cardiologist.

## 2017-04-23 NOTE — H&P (Signed)
History and Physical:    [name] [name]   WFU:[id number] DOB: Feb 19, 1935 DOA: 04/23/2017  Referring MD/provider: Dr Regenia Skeeter PCP: Maury Dus, MD   Patient coming from: Home  Chief Complaint: dizziness and presyncope   History of Present Illness:   [name] [name] is an 82 y.o. male with past medical history significant for coronary artery disease, aortic stenosis status post T aVR in 2018, known loculated pericardial effusion who presents with an episode of presyncope. History is per patient and it differs somewhat from the history noted in the cardiology note.  Patient states that he has felt chronically fatigued since his pneumonia approximately 6 months ago. He thinks that he may be has felt more fatigued over the past couple of days but is not sure. 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 Patient states that he felt tired leaving the classroom but thought it might be how heavy his backpack was. He went into the elevator and had an episode of feeling disoriented and not knowing if he was going to pass out. He states he stumbled around and then leaned against a wall and students helped him to the ground. He denies losing any consciousness or any head trauma.He notes that he's had dizziness with coughing before however he was not coughing during this period. Patient notes he felt better after being brought to the emergency room.  Patient denies being confused or disoriented during this episode. He denies having any chest pain or palpitations. No fevers or chills recently. Patient does have chronic fatigue as noted above for the past 6 months. No diarrhea or vomiting. No abdominal pain. No melena or hematemesis. No new cough although he does have the chronic cough that is nonproductive. He's had a chest CT recently which showed no pneumonia.   ED Course:  The  patient was thought to have a ST EMI and the cardiology team was activated. He was treated with aspirin 325. Cardiology noted J-point elevation and thought EKG findings may be suggestive of pericarditis. Given known pericardial effusion a stat echo was ordered.  ROS:   ROS   Review of Systems: General: No fever, chills, weight changes Skin: No rashes, lesions, wounds GU: No dysuria, increased frequency CNS: No numbness, dizziness, headache Musculoskeletal: No back pain, joint pain Blood/lymphatics: No easy bruising, bleeding  Past Medical History:   Past Medical History:  Diagnosis Date  . Actinic keratoses    Dr Glee Arvin  . Allergic rhinitis   . Aortic stenosis    s/p TAVR 06/2016  . Arthritis   . Benign essential HTN   . Bicuspid aortic valve   . Bilateral cataracts   . BPH (benign prostatic hypertrophy)   . Coronary artery disease involving native coronary artery of native heart without angina pectoris    PCI 06/06/16 LAD proximal 60 (FFR 0.85-negative); RI 50; LCx calcified without significant stenosis; RCA mid 80 (FFR 0.68-hemodynamically significant); PCI: 2.25 x 16 mm Synergy DES to the mid RCA  . Dilated aortic root ([place])    30mm by echo 08/2015  . Glaucoma   . H/O seasonal allergies   . Hx of adenomatous colonic polyps 1991 on  . Hx of cardiovascular stress test    Myoview (10/15):  normal  . Hyperlipidemia   . Mitral valve prolapse    mild MR by echo 08/2015  .  Paresthesia    Chronic left lateral thigh  . Pericardial effusion 11/05/2016    Past Surgical History:   Past Surgical History:  Procedure Laterality Date  . COLONOSCOPY  multiple  . CORONARY STENT INTERVENTION N/A 06/06/2016   Procedure: Coronary Stent Intervention;  Surgeon: [name], MD;  Location: [place] [facility];  Service: Cardiovascular;  Laterality: N/A;  . CYSTOSCOPY  08/12/2016   per patient; showed a cyst on bladder , liver , and kidney , sees urologist Matilde Bash III at  [facility]  . EYE SURGERY     Bilateral cataract removal  . INTRAVASCULAR PRESSURE WIRE/FFR STUDY N/A 05/29/2016   Procedure: Intravascular Pressure Wire/FFR Study;  Surgeon: [name], MD;  Location: [place] [facility];  Service: Cardiovascular;  Laterality: N/A;  . RIGHT/LEFT HEART CATH AND CORONARY ANGIOGRAPHY N/A 05/29/2016   Procedure: Right/Left Heart Cath and Coronary Angiography;  Surgeon: [name], MD;  Location: [place] [facility];  Service: Cardiovascular;  Laterality: N/A;  . TEE WITHOUT CARDIOVERSION N/A 07/02/2016   Procedure: TRANSESOPHAGEAL ECHOCARDIOGRAM (TEE);  Surgeon: [name], MD;  Location: [place];  Service: Open Heart Surgery;  Laterality: N/A;  . TONSILLECTOMY  1949  . TOTAL HIP ARTHROPLASTY Left 10/16/2016   Procedure: LEFT TOTAL HIP ARTHROPLASTY ANTERIOR APPROACH;  Surgeon: Gaynelle Arabian, MD;  Location: [facility];  Service: Orthopedics;  Laterality: Left;  . TRANSCATHETER AORTIC VALVE REPLACEMENT, TRANSFEMORAL N/A 07/02/2016   Procedure: TRANSCATHETER AORTIC VALVE REPLACEMENT, TRANSFEMORAL;  Surgeon: [name], MD;  Location: [place];  Service: Open Heart Surgery;  Laterality: N/A;    Social History:   Social History   Socioeconomic History  . Marital status: Widowed    Spouse name: Not on file  . Number of children: Not on file  . Years of education: Not on file  . Highest education level: Not on file  Occupational History  . Not on file  Social Needs  . Financial resource strain: Not on file  . Food insecurity:    Worry: Not on file    Inability: Not on file  . Transportation needs:    Medical: Not on file    Non-medical: Not on file  Tobacco Use  . Smoking status: Former Smoker    Last attempt to quit: 07/14/1960    Years since quitting: 56.8  . Smokeless tobacco: Never Used  Substance and Sexual Activity  . Alcohol use: No    Frequency: Never  . Drug use: No  . Sexual activity: Not on file  Lifestyle  . Physical activity:    Days  per week: Not on file    Minutes per session: Not on file  . Stress: Not on file  Relationships  . Social connections:    Talks on phone: Not on file    Gets together: Not on file    Attends religious service: Not on file    Active member of club or organization: Not on file    Attends meetings of clubs or organizations: Not on file    Relationship status: Not on file  . Intimate partner violence:    Fear of current or ex partner: Not on file    Emotionally abused: Not on file    Physically abused: Not on file    Forced sexual activity: Not on file  Other Topics Concern  . Not on file  Social History Narrative  . Not on file    Allergies   Patient has no known allergies.  Family  history:   Family History  Problem Relation Age of Onset  . Emphysema Mother   . Pancreatic cancer Father   . Heart attack Brother   . Heart disease Brother   . Colon cancer Neg Hx   . Stomach cancer Neg Hx     Current Medications:   Prior to Admission medications   Medication Sig Start Date End Date Taking? Authorizing Provider  acetaminophen (TYLENOL) 500 MG tablet Take 1 tablet (500 mg total) by mouth every 6 (six) hours as needed for moderate pain. Patient taking differently: Take 500-1,000 mg by mouth 2 (two) times daily. Takes 500mg  every AM and 1000mg  every PM. 06/06/16  Yes Cheryln Manly, NP  aspirin EC 81 MG tablet Take 81 mg daily by mouth.   Yes [provider]  clopidogrel (PLAVIX) 75 MG tablet Take 1 tablet (75 mg total) by mouth daily. 07/25/16 07/25/17 Yes [name], MD  dextromethorphan (DELSYM) 30 MG/5ML liquid Take 30 mg by mouth as needed for cough.   Yes [provider]  feeding supplement (BOOST / RESOURCE BREEZE) LIQD Take 1 Container by mouth 2 (two) times daily between meals. 12/04/16  Yes Short, Noah Delaine, MD  latanoprost (XALATAN) 0.005 % ophthalmic solution Place 1 drop into both eyes at bedtime.   Yes [provider]  pravastatin  (PRAVACHOL) 40 MG tablet Take 40 mg by mouth every evening.  06/03/11  Yes [provider]  timolol (BETIMOL) 0.5 % ophthalmic solution Place 1 drop into both eyes 2 (two) times daily.   Yes [provider]  cetaphil (CETAPHIL) cream Apply 1 application topically daily.    [provider]  dextromethorphan-guaiFENesin (MUCINEX DM) 30-600 MG 12hr tablet Take 2 tablets by mouth 2 (two) times daily. Patient not taking: Reported on 02/20/2017 12/04/16   Janece Canterbury, MD  feeding supplement, ENSURE ENLIVE, (ENSURE ENLIVE) LIQD Take 237 mLs by mouth daily. 12/04/16   Janece Canterbury, MD  fluticasone (FLONASE) 50 MCG/ACT nasal spray Place 1 spray into both nostrils daily.    [provider]    Physical Exam:   Vitals:   04/23/17 1415 04/23/17 1500 04/23/17 1530 04/23/17 1600  BP: 138/88 132/79 103/77 101/66  Pulse:    80  Resp: (!) 25 (!) 25 (!) 27 19  Temp:      TempSrc:      SpO2:    99%  Weight:      Height:         Physical Exam: Blood pressure 101/66, pulse 80, temperature 97.9 F (36.6 C), temperature source Oral, resp. rate 19, height 5\' 9"  (1.753 m), weight 57.2 kg (126 lb 3.2 oz), SpO2 99 %. Gen: thin man lying flat in bed in no distress. Eyes: Sclerae anicteric. Conjunctiva mildly injected. Chest: Moderately good air entry bilaterally with faint bilateral expiratory wheezes. No focal rhonchi or rales noted. CV:  irregular.Intermittent systolic murmur noted. Abdomen: NABS, soft, nondistended, nontender. No tenderness to light or deep palpation.  Extremities: No edema.  Skin: Warm and dry. No rashes, lesions or wounds. Neuro: Alert and oriented times 3; grossly nonfocal. Psych: Patient is cooperative, logical and coherent with appropriate mood and affect.  Data Review:    Labs: Basic Metabolic Panel: Recent Labs  Lab 04/23/17 1341  NA 135  K 4.2  CL 98*  CO2 24  GLUCOSE 110*  BUN 27*  CREATININE 0.87  CALCIUM 8.9  MG 2.0    Liver Function Tests: No results for input(s): AST, ALT,  ALKPHOS, BILITOT, PROT, ALBUMIN in the last 168 hours. No results for input(s): LIPASE, AMYLASE in the last 168 hours. No results for input(s): AMMONIA in the last 168 hours. CBC: Recent Labs  Lab 04/23/17 1341  WBC 12.9*  NEUTROABS 10.1*  HGB 14.0  HCT 43.9  MCV 92.4  PLT 228   Cardiac Enzymes: No results for input(s): CKTOTAL, CKMB, CKMBINDEX, TROPONINI in the last 168 hours.  BNP (last 3 results) No results for input(s): PROBNP in the last 8760 hours. CBG: No results for input(s): GLUCAP in the last 168 hours.  Urinalysis    Component Value Date/Time   COLORURINE YELLOW 06/28/2016 1155   APPEARANCEUR CLEAR 06/28/2016 1155   LABSPEC 1.014 06/28/2016 1155   PHURINE 5.0 06/28/2016 1155   GLUCOSEU NEGATIVE 06/28/2016 1155   HGBUR NEGATIVE 06/28/2016 [place] 06/28/2016 [place] 06/28/2016 1155   PROTEINUR NEGATIVE 06/28/2016 1155   NITRITE NEGATIVE 06/28/2016 1155   LEUKOCYTESUR NEGATIVE 06/28/2016 1155      Radiographic Studies: Dg Chest Portable 1 View  Result Date: 04/23/2017 CLINICAL DATA:  STEMI/AFib EXAM: PORTABLE CHEST 1 VIEW COMPARISON:  CT 02/21/2017, radiograph 01/21/2017 FINDINGS: Mild right apical pleural and parenchymal scarring. Cardiomegaly. Possible small left effusion. Airspace disease at the left lung base. Aortic atherosclerosis. No pneumothorax. Valvular prosthesis. IMPRESSION: 1. Cardiomegaly 2. Airspace disease at the left lung base may reflect atelectasis or pneumonia. Possible small left pleural effusion. Electronically Signed   By: Donavan Foil M.D.   On: 04/23/2017 14:24    EKG: Independently reviewed. Atrial fibrillation at 90. Q's in II, III, and F. No acute ST-T wave changes noted.   Assessment/Plan:   Principal Problem:   New onset atrial fibrillation (HCC) Active Problems:   Aortic stenosis   Benign essential HTN   Coronary artery  disease involving native coronary artery of native heart without angina pectoris   Pericardial effusion   Postural dizziness with presyncope  82 year old gentleman with aortic stenosis status post TAVR and known pericardial effusions now presents with focal dizziness as well as subacute fatigue and is found to be in new atrial fibrillation that is relatively rate controlled did go up briefly to 125 follow was talked to him.   ATRIAL FIBRILLATION Heart rate ranged from 90-120 while I was talking to the patient.  He would benefit from starting a rate control agent however given relatively low blood pressure, would defer decision of carvedilol versus diltiazem to cardiology. Would start Eliquis per cardiology recommendations if in fact the pericardial effusion is stable. Echocardiogram is done and read is pending.  PRESYNCOPE Patient's subacute fatigue and episode of presyncope today could certainly be secondary to his atrial fibrillation.Since history is not suggestive of a neurologic event since it was so focal and there was no disorientation during it and there were no focal deficits afterwards.  COUGH Patient with persistent cough and expiratory wheezing which she states has been persistent since his diagnosis of pneumonia 6 months ago. He does have some small pulmonary nodules which will need a repeat chest CT in 3-6 months. In the meanwhile I will try him on Atrovent 3 times a day standing to see it helps with his cough and what sounds like bronchospasm. He may benefit from being discharged with Atrovent although would avoid albuterol given atrial fibrillation without any rate control medications so far.  CAD Patient is on Plavix and aspirin as well as pravastatin but does not appear to be on a  beta blocker likely secondary to baseline blood pressure 100/60.    Other information:   DVT prophylaxis: patient will probably be started on Eliquis, TED hose ordered in the meanwhile  Code  Status: Full code. Family Communication: no need to contact family per patient Disposition Plan: home Consults called: cardiology Admission status: observation   The medical decision making on this patient was of high complexity and the patient is at high risk for clinical deterioration, therefore this is a level 3 visit.  Dewaine Oats Derek Jack [facility] Pager [phone] Cell: [phone]   If 7PM-7AM, please contact night-coverage [email or web address] Password [facility] 04/23/2017, 4:47 PM

## 2017-04-23 NOTE — ED Triage Notes (Signed)
Pt arrives via EMS with complaints of SOB after walking. Pt reports a valve replacement and stent placement in June and PNA in November. Pt states he has had chronic fatigue since the PNA but worsened today. EMS reports new onset Afib with HR in 130s.

## 2017-04-23 NOTE — ED Provider Notes (Signed)
[facility] EMERGENCY DEPARTMENT Provider Note   CSN: [id number] Arrival date & time:        History   Chief Complaint Chief Complaint  Patient presents with  . Shortness of Breath  . Atrial Fibrillation    HPI ISAK SOTOMAYOR is a 82 y.o. male.  HPI  82 year old male presents with near syncope.  Patient has a history of an aortic valve replacement and is currently on Plavix.  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  Describes it mostly as a near syncopal episode.  He had to put his hand on the wall to prevent from falling.  He denies feeling any palpitations.  No headache.  No prior history of A. fib.  EMS noted him to have atrial fibrillation with a heart rate in the 130s.  He had pneumonia back in November and has had a continued cough and felt fatigued since then.  He states his fatigue is worse today.  There is no chest pain.  Currently at rest he feels fine.  Past Medical History:  Diagnosis Date  . Actinic keratoses    Dr Glee Arvin  . Allergic rhinitis   . Aortic stenosis    s/p TAVR 06/2016  . Arthritis   . Benign essential HTN   . Bicuspid aortic valve   . Bilateral cataracts   . BPH (benign prostatic hypertrophy)   . Coronary artery disease involving native coronary artery of native heart without angina pectoris    PCI 06/06/16 LAD proximal 60 (FFR 0.85-negative); RI 50; LCx calcified without significant stenosis; RCA mid 80 (FFR 0.68-hemodynamically significant); PCI: 2.25 x 16 mm Synergy DES to the mid RCA  . Dilated aortic root ([place])    30mm by echo 08/2015  . Glaucoma   . H/O seasonal allergies   . Hx of adenomatous colonic polyps 1991 on  . Hx of cardiovascular stress test    Myoview (10/15):  normal  . Hyperlipidemia   . Mitral valve prolapse    mild MR by echo 08/2015  . Paresthesia    Chronic left lateral thigh  . Pericardial effusion 11/05/2016    Patient Active Problem List   Diagnosis Date Noted  .  New onset atrial fibrillation ([place]) 04/23/2017  . Constipation 12/02/2016  . Multifocal pneumonia 12/01/2016  . Pericardial effusion 11/05/2016  . OA (osteoarthritis) of hip 10/16/2016  . S/P TAVR (transcatheter aortic valve replacement) 07/02/2016  . 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 Coronary artery disease involving native coronary artery of native heart without angina pectoris   . Benign essential HTN   . Bilateral hand pain 08/14/2015  . Arthritis of left hip 11/03/2014  . Left knee pain 10/06/2014  . Aortic stenosis 09/09/2013  . Bicuspid aortic valve 09/09/2013  . History of colonic polyps 05/12/2007  . HLD (hyperlipidemia) 05/12/2007  . INTERNAL HEMORRHOIDS 05/12/2007    Past Surgical History:  Procedure Laterality Date  . COLONOSCOPY  multiple  . CORONARY STENT INTERVENTION N/A 06/06/2016   Procedure: Coronary Stent Intervention;  Surgeon: [name], MD;  Location: [facility];  Service: Cardiovascular;  Laterality: N/A;  . CYSTOSCOPY  08/12/2016   per patient; showed a cyst on bladder , liver , and kidney , sees urologist Matilde Bash III at [facility]  . EYE SURGERY     Bilateral cataract removal  . INTRAVASCULAR PRESSURE WIRE/FFR STUDY N/A 05/29/2016   Procedure: Intravascular Pressure Wire/FFR Study;  Surgeon: [name],  MD;  Location: [facility];  Service: Cardiovascular;  Laterality: N/A;  . RIGHT/LEFT HEART CATH AND CORONARY ANGIOGRAPHY N/A 05/29/2016   Procedure: Right/Left Heart Cath and Coronary Angiography;  Surgeon: [name], MD;  Location: [facility];  Service: Cardiovascular;  Laterality: N/A;  . TEE WITHOUT CARDIOVERSION N/A 07/02/2016   Procedure: TRANSESOPHAGEAL ECHOCARDIOGRAM (TEE);  Surgeon: [name], MD;  Location: [facility];  Service: Open Heart Surgery;  Laterality: N/A;  . TONSILLECTOMY  1949  . TOTAL HIP ARTHROPLASTY Left 10/16/2016   Procedure: LEFT TOTAL HIP ARTHROPLASTY ANTERIOR APPROACH;  Surgeon: Gaynelle Arabian, MD;  Location: [facility];  Service: Orthopedics;  Laterality: Left;  . TRANSCATHETER AORTIC VALVE REPLACEMENT, TRANSFEMORAL N/A 07/02/2016   Procedure: TRANSCATHETER AORTIC VALVE REPLACEMENT, TRANSFEMORAL;  Surgeon: [name], MD;  Location: [facility];  Service: Open Heart Surgery;  Laterality: N/A;        Home Medications    Prior to Admission medications   Medication Sig Start Date End Date Taking? Authorizing Provider  acetaminophen (TYLENOL) 500 MG tablet Take 1 tablet (500 mg total) by mouth every 6 (six) hours as needed for moderate pain. Patient taking differently: Take 500-1,000 mg by mouth 2 (two) times daily. Takes 500mg  every AM and 1000mg  every PM. 06/06/16  Yes Cheryln Manly, NP  aspirin EC 81 MG tablet Take 81 mg daily by mouth.   Yes [provider]  clopidogrel (PLAVIX) 75 MG tablet Take 1 tablet (75 mg total) by mouth daily. 07/25/16 07/25/17 Yes [name], MD  dextromethorphan (DELSYM) 30 MG/5ML liquid Take 30 mg by mouth as needed for cough.   Yes [provider]  feeding supplement (BOOST / RESOURCE BREEZE) LIQD Take 1 Container by mouth 2 (two) times daily between meals. 12/04/16  Yes Short, Noah Delaine, MD  latanoprost (XALATAN) 0.005 % ophthalmic solution Place 1 drop into both eyes at bedtime.   Yes [provider]  pravastatin (PRAVACHOL) 40 MG tablet Take 40 mg by mouth every evening.  06/03/11  Yes [provider]  timolol (BETIMOL) 0.5 % ophthalmic solution Place 1 drop into both eyes 2 (two) times daily.   Yes [provider]  cetaphil (CETAPHIL) cream Apply 1 application topically daily.    [provider]  dextromethorphan-guaiFENesin (MUCINEX DM) 30-600 MG 12hr tablet Take 2 tablets by mouth 2 (two) times daily. Patient not taking: Reported on 02/20/2017 12/04/16   Janece Canterbury, MD  feeding supplement, ENSURE ENLIVE, (ENSURE ENLIVE) LIQD Take 237 mLs by mouth daily. 12/04/16   Janece Canterbury,  MD  fluticasone (FLONASE) 50 MCG/ACT nasal spray Place 1 spray into both nostrils daily.    [provider]    Family History Family History  Problem Relation Age of Onset  . Emphysema Mother   . Pancreatic cancer Father   . Heart attack Brother   . Heart disease Brother   . Colon cancer Neg Hx   . Stomach cancer Neg Hx     Social History Social History   Tobacco Use  . Smoking status: Former Smoker    Last attempt to quit: 07/14/1960    Years since quitting: 56.8  . Smokeless tobacco: Never Used  Substance Use Topics  . Alcohol use: No    Frequency: Never  . Drug use: No     Allergies   Patient has no known allergies.   Review of Systems Review of Systems  Constitutional: Positive for fatigue. Negative for fever.  Respiratory: Negative for  cough and shortness of breath.   Cardiovascular: Negative for chest pain and palpitations.  Gastrointestinal: Negative for abdominal pain and vomiting.  Neurological: Positive for light-headedness. Negative for syncope and headaches.  All other systems reviewed and are negative.    Physical Exam Updated Vital Signs BP 103/77   Pulse 83   Temp 97.9 F (36.6 C) (Oral)   Resp (!) 27   Ht 5\' 9"  (1.753 m)   Wt 57.2 kg (126 lb 3.2 oz)   SpO2 97%   BMI 18.64 kg/m   Physical Exam  Constitutional: He is oriented to person, place, and time. He appears well-developed and well-nourished.  HENT:  Head: Normocephalic and atraumatic.  Right Ear: External ear normal.  Left Ear: External ear normal.  Nose: Nose normal.  Eyes: Right eye exhibits no discharge. Left eye exhibits no discharge.  Neck: Neck supple.  Cardiovascular: Normal rate and normal heart sounds. An irregular rhythm present.  HR 90s, low 100s  Pulmonary/Chest: Effort normal and breath sounds normal.  Abdominal: Soft. There is no tenderness.  Musculoskeletal: He exhibits no edema.  Neurological: He is alert and oriented to person, place, and time.    Skin: Skin is warm and dry.  Nursing note and vitals reviewed.    ED Treatments / Results  Labs (all labs ordered are listed, but only abnormal results are displayed) Labs Reviewed  BASIC METABOLIC PANEL - Abnormal; Notable for the following components:      Result Value   Chloride 98 (*)    Glucose, Bld 110 (*)    BUN 27 (*)    All other components within normal limits  CBC WITH DIFFERENTIAL/PLATELET - Abnormal; Notable for the following components:   WBC 12.9 (*)    Neutro Abs 10.1 (*)    Monocytes Absolute 1.7 (*)    All other components within normal limits  MAGNESIUM  I-STAT TROPONIN, ED    EKG EKG Interpretation  Date/Time:  Wednesday April 23 2017 13:15:36 EDT Ventricular Rate:  95 PR Interval:    QRS Duration: 95 QT Interval:  373 QTC Calculation: 469 R Axis:   -56 Text Interpretation:  Atrial fibrillation ST elevation, consider anterior injury Lateral leads are also involved ST elevations unchanged from earlier in the day but new compared to priors Confirmed by Sherwood Gambler [phone]) on 04/23/2017 1:55:34 PM   Radiology Dg Chest Portable 1 View  Result Date: 04/23/2017 CLINICAL DATA:  STEMI/AFib EXAM: PORTABLE CHEST 1 VIEW COMPARISON:  CT 02/21/2017, radiograph 01/21/2017 FINDINGS: Mild right apical pleural and parenchymal scarring. Cardiomegaly. Possible small left effusion. Airspace disease at the left lung base. Aortic atherosclerosis. No pneumothorax. Valvular prosthesis. IMPRESSION: 1. Cardiomegaly 2. Airspace disease at the left lung base may reflect atelectasis or pneumonia. Possible small left pleural effusion. Electronically Signed   By: Donavan Foil M.D.   On: 04/23/2017 14:24    Procedures Procedures (including critical care time)  Medications Ordered in ED Medications  sodium chloride 0.9 % bolus 1,000 mL (0 mLs Intravenous Stopped 04/23/17 1534)  aspirin chewable tablet 324 mg (324 mg Oral Given 04/23/17 1428)     Initial Impression /  Assessment and Plan / ED Course  I have reviewed the triage vital signs and the nursing notes.  Pertinent labs & imaging results that were available during my care of the patient were reviewed by me and considered in my medical decision making (see chart for details).  Clinical Course as of Apr 24 1543  Wed Apr 23, 2017  1350 D/w Dr. Tamala Julian, who advises calling code STEMI to activate team and get STAT cards consult to eval. Definitely has ST elevations, but currently symptom free.   [SG]  61 D/w Dr. Burt Knack, team will evaluate patient.    [SG]    Clinical Course User Index [SG] Sherwood Gambler, MD    Patient remains asymptomatic at rest.  He is in A. fib although he is bouncing between the 80s and 100s.  Blood pressure is okay.  He has a known pericardial effusion but currently has no signs or symptoms of tamponade.  It is unclear when his A. fib started as you not having palpitations and cannot feel that he is in A. fib now.  Unclear if the episode today was from dehydration or rate related.  Cardiology has seen patient and feels he may have pericarditis and recommends echo which they will order.  If echo does not show enlarged pericardial effusion, started on Eliquis.  Otherwise cycle enzymes.  However they have determined this does not seem consistent with STEMI and will otherwise observe and monitor patient.  Hospitalist to admit.  Final Clinical Impressions(s) / ED Diagnoses   Final diagnoses:  New onset atrial fibrillation [facility])    ED Discharge Orders    None       Sherwood Gambler, MD 04/23/17 1546

## 2017-04-23 NOTE — Progress Notes (Signed)
Patient complaining of 4/10 left side chest pain when coughing.  Patient would like to try some Tylenol to see if that would help.  No PRNs for pain currently ordered.  RN text paged [facility] with this information.

## 2017-04-23 NOTE — ED Notes (Signed)
Activated code stemi per MD Regenia Skeeter

## 2017-04-23 NOTE — Consult Note (Addendum)
Cardiology Consult    Patient ID: [name] [name] MRN: [id number], DOB/AGE: 1935-04-04   Admit date: 04/23/2017 Date of Consult: 04/23/2017  Primary Physician: Maury Dus, MD Primary Cardiologist: Dr. Doneta Public Requesting Provider: Dr. Regenia Skeeter Reason for Consultation: Chest pain, abnormal EKG  [name] [name] is a 82 y.o. male who is being seen today for the evaluation of chest pain and abnormal EKG at the request of Dr. Regenia Skeeter.   Patient Profile    82 yo male with PMH of AS s/p TAVR (5/18), CAD with PCI to RCA, HTN, HL, pericardial effusion, BPH, and mild MR who presented with chest pain and syncope. Found to be in new onset Afib and had an abnormal EKG.   Past Medical History   Past Medical History:  Diagnosis Date  . Actinic keratoses    Dr Glee Arvin  . Allergic rhinitis   . Aortic stenosis    s/p TAVR 06/2016  . Arthritis   . Benign essential HTN   . Bicuspid aortic valve   . Bilateral cataracts   . BPH (benign prostatic hypertrophy)   . Coronary artery disease involving native coronary artery of native heart without angina pectoris    PCI 06/06/16 LAD proximal 60 (FFR 0.85-negative); RI 50; LCx calcified without significant stenosis; RCA mid 80 (FFR 0.68-hemodynamically significant); PCI: 2.25 x 16 mm Synergy DES to the mid RCA  . Dilated aortic root ([place])    60mm by echo 08/2015  . Glaucoma   . H/O seasonal allergies   . Hx of adenomatous colonic polyps 1991 on  . Hx of cardiovascular stress test    Myoview (10/15):  normal  . Hyperlipidemia   . Mitral valve prolapse    mild MR by echo 08/2015  . Paresthesia    Chronic left lateral thigh  . Pericardial effusion 11/05/2016    Past Surgical History:  Procedure Laterality Date  . COLONOSCOPY  multiple  . CORONARY STENT INTERVENTION N/A 06/06/2016   Procedure: Coronary Stent Intervention;  Surgeon: [name], MD;  Location: [facility];  Service: Cardiovascular;  Laterality: N/A;  .  CYSTOSCOPY  08/12/2016   per patient; showed a cyst on bladder , liver , and kidney , sees urologist Matilde Bash III at [facility]  . EYE SURGERY     Bilateral cataract removal  . INTRAVASCULAR PRESSURE WIRE/FFR STUDY N/A 05/29/2016   Procedure: Intravascular Pressure Wire/FFR Study;  Surgeon: [name], MD;  Location: [facility];  Service: Cardiovascular;  Laterality: N/A;  . RIGHT/LEFT HEART CATH AND CORONARY ANGIOGRAPHY N/A 05/29/2016   Procedure: Right/Left Heart Cath and Coronary Angiography;  Surgeon: [name], MD;  Location: [facility];  Service: Cardiovascular;  Laterality: N/A;  . TEE WITHOUT CARDIOVERSION N/A 07/02/2016   Procedure: TRANSESOPHAGEAL ECHOCARDIOGRAM (TEE);  Surgeon: [name], MD;  Location: [facility];  Service: Open Heart Surgery;  Laterality: N/A;  . TONSILLECTOMY  1949  . TOTAL HIP ARTHROPLASTY Left 10/16/2016   Procedure: LEFT TOTAL HIP ARTHROPLASTY ANTERIOR APPROACH;  Surgeon: Gaynelle Arabian, MD;  Location: [facility];  Service: Orthopedics;  Laterality: Left;  . TRANSCATHETER AORTIC VALVE REPLACEMENT, TRANSFEMORAL N/A 07/02/2016   Procedure: TRANSCATHETER AORTIC VALVE REPLACEMENT, TRANSFEMORAL;  Surgeon: [name], MD;  Location: [facility];  Service: Open Heart Surgery;  Laterality: N/A;     Allergies  No Known Allergies  History of Present Illness    [name] [name] is a 82 yo male with PMH of AS s/p TAVR (5/18),  CAD with PCI to RCA, HTN, HL, pericardial effusion, BPH, and mild MR. He was referred to Dr. Burt Knack back in early 2018 for evaluation of severe AS. Had cath prior to TAVR with PCI/DES to the RCA. Underwent TAVR 07/02/16. Noted to have a chronic pericardial effusion that was noted as moderate on the day of his TAVR without signs of tamponade. He was last seen in the office on 10/18 by Dr. Radford Pax for follow up. Reported being compliant with his medications.   Reports he was dx with PNA back in 11/18 and has not fully recovered since that  time. Has had persistent cough and intermittent chest pain with his coughing. Did have a CT chest back in 2/19 that showed small pericardial effusion with small pulmonary nodules within each lung with recommendations for repeat CT in 3-6 months. In talking with patient he has been declining for several months, and he feels like this all started back in 11/18. Reports a 40lb weight loss over the past year.   Over the past couple of days he has had a persistent cough, making his chest sore and his neck. Today reports he was walking in the hallway when he had a sudden onset of weakness and had a syncopal episode. EMS was called. He denies any palpitations, n/v/d, or lightheadedness. EKG noted new onset Afib with rate in the 120s, also with j point elevation in lead II, III and v5-v6. Initial trop 0.02, Hgb 14.0, WBC 12.9  Inpatient Medications    No current facility-administered medications for this encounter.   Current Outpatient Medications:  .  acetaminophen (TYLENOL) 500 MG tablet, Take 1 tablet (500 mg total) by mouth every 6 (six) hours as needed for moderate pain. (Patient taking differently: Take 500-1,000 mg by mouth 2 (two) times daily. Takes 500mg  every AM and 1000mg  every PM.), Disp: 30 tablet, Rfl: 0 .  aspirin EC 81 MG tablet, Take 81 mg daily by mouth., Disp: , Rfl:  .  clopidogrel (PLAVIX) 75 MG tablet, Take 1 tablet (75 mg total) by mouth daily., Disp: 30 tablet, Rfl: 11 .  dextromethorphan (DELSYM) 30 MG/5ML liquid, Take 30 mg by mouth as needed for cough., Disp: , Rfl:  .  feeding supplement (BOOST / RESOURCE BREEZE) LIQD, Take 1 Container by mouth 2 (two) times daily between meals., Disp: 40 Container, Rfl: 0 .  latanoprost (XALATAN) 0.005 % ophthalmic solution, Place 1 drop into both eyes at bedtime., Disp: , Rfl:  .  pravastatin (PRAVACHOL) 40 MG tablet, Take 40 mg by mouth every evening. , Disp: , Rfl:  .  timolol (BETIMOL) 0.5 % ophthalmic solution, Place 1 drop into both eyes 2  (two) times daily., Disp: , Rfl:  .  cetaphil (CETAPHIL) cream, Apply 1 application topically daily., Disp: , Rfl:  .  dextromethorphan-guaiFENesin (MUCINEX DM) 30-600 MG 12hr tablet, Take 2 tablets by mouth 2 (two) times daily. (Patient not taking: Reported on 02/20/2017), Disp: 20 tablet, Rfl: 0 .  feeding supplement, ENSURE ENLIVE, (ENSURE ENLIVE) LIQD, Take 237 mLs by mouth daily., Disp: 30 Bottle, Rfl: 0 .  fluticasone (FLONASE) 50 MCG/ACT nasal spray, Place 1 spray into both nostrils daily., Disp: , Rfl:   Family History    Family History  Problem Relation Age of Onset  . Emphysema Mother   . Pancreatic cancer Father   . Heart attack Brother   . Heart disease Brother   . Colon cancer Neg Hx   . Stomach cancer Neg Hx  Social History    Social History   Socioeconomic History  . Marital status: Widowed    Spouse name: Not on file  . Number of children: Not on file  . Years of education: Not on file  . Highest education level: Not on file  Occupational History  . Not on file  Social Needs  . Financial resource strain: Not on file  . Food insecurity:    Worry: Not on file    Inability: Not on file  . Transportation needs:    Medical: Not on file    Non-medical: Not on file  Tobacco Use  . Smoking status: Former Smoker    Last attempt to quit: 07/14/1960    Years since quitting: 56.8  . Smokeless tobacco: Never Used  Substance and Sexual Activity  . Alcohol use: No    Frequency: Never  . Drug use: No  . Sexual activity: Not on file  Lifestyle  . Physical activity:    Days per week: Not on file    Minutes per session: Not on file  . Stress: Not on file  Relationships  . Social connections:    Talks on phone: Not on file    Gets together: Not on file    Attends religious service: Not on file    Active member of club or organization: Not on file    Attends meetings of clubs or organizations: Not on file    Relationship status: Not on file  . Intimate partner  violence:    Fear of current or ex partner: Not on file    Emotionally abused: Not on file    Physically abused: Not on file    Forced sexual activity: Not on file  Other Topics Concern  . Not on file  Social History Narrative  . Not on file     Review of Systems    See HPI  All other systems reviewed and are otherwise negative except as noted above.  Physical Exam    Blood pressure 138/88, pulse 83, temperature 97.9 F (36.6 C), temperature source Oral, resp. rate (!) 25, height 5\' 9"  (1.753 m), weight 126 lb 3.2 oz (57.2 kg), SpO2 97 %.  General: Pleasant, frail older, NAD Psych: Normal affect. Neuro: Alert and oriented X 3. Moves all extremities spontaneously. HEENT: Normal  Neck: Supple, mild JVD. Lungs:  Resp regular and unlabored, diminished in lower lobes. Heart: RRR no s3, s4, soft systolic murmur, pericardial rub. Abdomen: Soft, non-tender, non-distended, BS + x 4.  Extremities: No clubbing, cyanosis or edema. DP/PT/Radials 2+ and equal bilaterally.  Labs    Troponin [facility] Test) Recent Labs    04/23/17 1347  TROPIPOC 0.02   No results for input(s): CKTOTAL, CKMB, TROPONINI in the last 72 hours. Lab Results  Component Value Date   WBC 12.9 (H) 04/23/2017   HGB 14.0 04/23/2017   HCT 43.9 04/23/2017   MCV 92.4 04/23/2017   PLT 228 04/23/2017    Recent Labs  Lab 04/23/17 1341  NA 135  K 4.2  CL 98*  CO2 24  BUN 27*  CREATININE 0.87  CALCIUM 8.9  GLUCOSE 110*   Lab Results  Component Value Date   CHOL 120 11/05/2016   HDL 39 (L) 11/05/2016   LDLCALC 65 11/05/2016   TRIG 81 11/05/2016   No results found for: [facility]   Radiology Studies    Ct Abdomen W Wo Contrast  Result Date: 04/15/2017 CLINICAL DATA:  Evaluate pancreas  lesion. EXAM: CT ABDOMEN WITHOUT AND WITH CONTRAST TECHNIQUE: Multidetector CT imaging of the abdomen was performed following the standard protocol before and following the bolus administration of intravenous  contrast. CONTRAST:  167mL ISOVUE-300 IOPAMIDOL (ISOVUE-300) INJECTION 61% COMPARISON:  MRI 10/17/2015. FINDINGS: Lower chest: No pleural effusion identified. Cardiac enlargement identified. Moderate pericardial effusion is identified. Similar to 02/21/2017. Calcifications within the RCA coronary artery noted. Again noted is a mosaic attenuation pattern within both lungs. There is diffuse bronchial wall thickening noted. Hepatobiliary: Simple appearing cyst identified within the left lobe of liver measuring 1 cm. No suspicious liver abnormalities. The gallbladder appears normal. No biliary dilatation. Pancreas: No pancreatic inflammation identified. Previously characterized small cystic lesion within body of the pancreas measures 8 mm, image 35/4. Stable from 10/17/2015. No pancreatic duct dilatation or solid mass identified. Spleen: Normal in size without focal abnormality. Adrenals/Urinary Tract: Adrenal glands are unremarkable. No kidney mass or hydronephrosis. 7 mm low-density structure within the interpolar right kidney is unchanged and remains too small to reliably characterize, image 55/4. Stomach/Bowel: Stomach is within normal limits. No evidence of bowel wall thickening, distention, or inflammatory changes. Vascular/Lymphatic: Aortic atherosclerosis. No aneurysm. No upper abdominal adenopathy. Other: No free fluid or fluid collections. Musculoskeletal: Mild degenerative changes noted within the lower thoracic and lumbar spine. IMPRESSION: 1. Stable appearance of cystic lesion involving the body of pancreas measuring 7 mm. This is remain unchanged from 10/17/2015. Followup examination in 24 months is advised to ensure ongoing stability. This recommendation follows ACR consensus guidelines: Management of Incidental Pancreatic Cysts: A White Paper of the ACR Incidental Findings Committee. J Am Coll Radiol 1610;[id number]. 2. Pericardial effusion, similar to 02/21/2017. 3.  Aortic Atherosclerosis  (ICD10-I70.0). Electronically Signed   By: Kerby Moors M.D.   On: 04/15/2017 12:12   Dg Chest Portable 1 View  Result Date: 04/23/2017 CLINICAL DATA:  STEMI/AFib EXAM: PORTABLE CHEST 1 VIEW COMPARISON:  CT 02/21/2017, radiograph 01/21/2017 FINDINGS: Mild right apical pleural and parenchymal scarring. Cardiomegaly. Possible small left effusion. Airspace disease at the left lung base. Aortic atherosclerosis. No pneumothorax. Valvular prosthesis. IMPRESSION: 1. Cardiomegaly 2. Airspace disease at the left lung base may reflect atelectasis or pneumonia. Possible small left pleural effusion. Electronically Signed   By: Donavan Foil M.D.   On: 04/23/2017 14:24    ECG & Cardiac Imaging    EKG:  The EKG was personally reviewed and demonstrates AFib with j point elevation in lead II, III and v5-v6.  Echo: 01/30/17  Study Conclusions  - Left ventricle: The cavity size was normal. Wall thickness was   increased in a pattern of moderate LVH. Systolic function was   normal. The estimated ejection fraction was in the range of 50%   to 55%. Wall motion was normal; there were no regional wall   motion abnormalities. Doppler parameters are consistent with   abnormal left ventricular relaxation (grade 1 diastolic   dysfunction). - Aortic valve: A bioprosthesis was present. There was mild   regurgitation. Valve area (VTI): 1.85 cm^2. Valve area (Vmax):   1.6 cm^2. Valve area (Vmean): 1.73 cm^2. - Mitral valve: There was mild regurgitation. - Right atrium: The atrium was mildly dilated. - Pulmonary arteries: Systolic pressure was mildly increased. PA   peak pressure: 36 mm Hg (S). - Pericardium, extracardiac: A moderate, loculated pericardial   effusion was identified posterior to the heart.  Assessment & Plan    82 yo male with PMH of AS s/p TAVR (5/18), CAD with PCI to RCA,  HTN, HL, pericardial effusion, BPH, and mild MR who presented with chest pain and syncope. Found to be in new onset Afib and  had an abnormal EKG.  1. New onset Afib RVR: noted on EKG this admission without hx of same. Rate is only in the 90s and he appears comfortable at rest. Will need to be on [facility] but will hold on adding until echo can be obtained to ensure no change in pericardial effusion. This patients CHA2DS2-VASc Score of at least 3.   2. Abnormal EKG: EKG this admission is abnormal and shows ST changes in leads II, III and v5-v6. He does report chest pain, but only with coughing and movement. Initial trop 0.02 -- continue to cycle trops -- check echo -- repeat ekg in the am  3. AS s/p TAVR: last echo 01/30/17 noted stable valve with mild regurgitation. No signs of HF noted on exam  4. Pericardial effusion: read as moderate loculated effusion on echo back in 1/19. Ensure no change prior to starting [place].   5. CAD s/p Stent to RCA: Has been on ASA/plavix and been compliant.    6. Failure to thrive?/ unintentional weight loss: reports losing 40 lbs over the past year. Pulmonary nodules noted on CT chest back in 2/19 with recommended follow up in 3-6 months.  Barnet Pall, NP-C Pager [phone] 04/23/2017, 3:12 PM   Personally seen and examined. Agree with above.  82 year old patient of Dr. Theodosia Blender with TAVR and RCA PCI last year here with new onset AFIB, brief syncope in the setting of cough, 40 pound weight loss since last year, mild pericardial effusion chronic.   In bed. Fairly comfortable, but appears thin, frail, alert, subxiphoid rub noted, irreg irreg, mildly tachy, lungs clear, occasional cough, no edema, normal abd.   Labs: personally reviewed. Trop normal  ECG: J point elevation in inferior and precordial lateral leads (change from prior) personally viewed  A/P:  Chest pain/pericarditis possible/new onset AFIB/syncope  - originally called STEMI but not a STEMI. May be indicative of pericardial inflammation. Has had a cough for 6 months. No recent fevers or chills. Rub on exam.     - XRAY with cardiomegaly.   - Will check echo to make sure his pericardial effusion has not significantly expanded.   - Likely cough induced syncope. Make sure no tamponade physiology.   - Cycle trop.  - If pericardial effusion is stable, start Eliquis 2.5 mg PO BID. Also on Plavix. Would stop ASA when starting Eliquis. Will be increased bleeding risk.    Candee Furbish, MD

## 2017-04-23 NOTE — Progress Notes (Signed)
  Echocardiogram 2D Echocardiogram has been performed.  [name] [name] 04/23/2017, 3:46 PM

## 2017-04-24 DIAGNOSIS — E43 Unspecified severe protein-calorie malnutrition: Secondary | ICD-10-CM

## 2017-04-24 DIAGNOSIS — I313 Pericardial effusion (noninflammatory): Secondary | ICD-10-CM | POA: Diagnosis not present

## 2017-04-24 DIAGNOSIS — I4891 Unspecified atrial fibrillation: Secondary | ICD-10-CM | POA: Diagnosis not present

## 2017-04-24 LAB — BASIC METABOLIC PANEL
Anion gap: 7 (ref 5–15)
BUN: 29 mg/dL — AB (ref 6–20)
CO2: 27 mmol/L (ref 22–32)
CREATININE: 0.75 mg/dL (ref 0.61–1.24)
Calcium: 8.5 mg/dL — ABNORMAL LOW (ref 8.9–10.3)
Chloride: 102 mmol/L (ref 101–111)
GFR calc Af Amer: >60 mL/min (ref >60)
Glucose, Bld: 103 mg/dL — ABNORMAL HIGH (ref 65–99)
Potassium: 4.5 mmol/L (ref 3.5–5.1)
SODIUM: 136 mmol/L (ref 135–145)

## 2017-04-24 LAB — CBC
HCT: 36.9 % — ABNORMAL LOW (ref 39.0–52.0)
Hemoglobin: 11.9 g/dL — ABNORMAL LOW (ref 13.0–17.0)
MCH: 29.9 pg (ref 26.0–34.0)
MCHC: 32.2 g/dL (ref 30.0–36.0)
MCV: 92.7 fL (ref 78.0–100.0)
PLATELETS: 238 10*3/uL (ref 150–400)
RBC: 3.98 MIL/uL — ABNORMAL LOW (ref 4.22–5.81)
RDW: 15.6 % — AB (ref 11.5–15.5)
WBC: 9.6 10*3/uL (ref 4.0–10.5)

## 2017-04-24 LAB — TROPONIN I: Troponin I: <0.03 ng/mL (ref <0.03)

## 2017-04-24 MED ORDER — LEVALBUTEROL HCL 0.63 MG/3ML IN NEBU: 0.6300 mg | INHALATION_SOLUTION | Freq: Four times a day (QID) | RESPIRATORY_TRACT | Status: DC | PRN

## 2017-04-24 MED ORDER — FAMOTIDINE 20 MG PO TABS
20.0000 mg | ORAL_TABLET | Freq: Every day | ORAL | Status: DC
  Administered 2017-04-24 – 2017-04-25 (×2): 20 mg via ORAL
  Filled 2017-04-24 (×2): qty 1

## 2017-04-24 MED ORDER — IBUPROFEN 600 MG PO TABS
600.0000 mg | ORAL_TABLET | Freq: Three times a day (TID) | ORAL | Status: DC
  Administered 2017-04-24 – 2017-04-25 (×3): 600 mg via ORAL
  Filled 2017-04-24 (×3): qty 1

## 2017-04-24 MED ORDER — IPRATROPIUM BROMIDE 0.02 % IN SOLN
0.5000 mg | Freq: Four times a day (QID) | RESPIRATORY_TRACT | Status: DC | PRN
Start: 2017-04-24 — End: 2017-04-25

## 2017-04-24 MED ORDER — IBUPROFEN 600 MG PO TABS
600.0000 mg | ORAL_TABLET | Freq: Three times a day (TID) | ORAL | Status: DC
  Administered 2017-04-24: 600 mg via ORAL
  Filled 2017-04-24: qty 1

## 2017-04-24 NOTE — Progress Notes (Signed)
PROGRESS NOTE    [name] [name]  YTK:[id number] DOB: 31-Jan-1935 DOA: 04/23/2017 PCP: Maury Dus, MD    Brief Narrative:  82 y/o presenting with dizziness and presyncope diagnosed with new onset afib   Assessment & Plan:   Principal Problem:   New onset atrial fibrillation [facility]) - Cardiology consulted and managing - continue to monitor.  Active Problems:   Aortic stenosis   Benign essential HTN   Coronary artery disease involving native coronary artery of native heart without angina pectoris   Pericardial effusion - s/p echocardiogram to decide on whether or not to place on anticoagulation    Postural dizziness with presyncope - most likely related to new onset atrial fibrillation   Protein-calorie malnutrition, severe  DVT prophylaxis: ted hose, anticoagulation deferred to cardiology Code Status: Full Family Communication: none at bedside. Disposition Plan: pending improvement in condition and once cleared for discharge per cardiology   Consultants:   cardiology   Procedures: echocardiogram   Antimicrobials: none   Subjective: Patient has no new complaints.  Objective: Vitals:   04/23/17 2014 04/24/17 0410 04/24/17 0942 04/24/17 1350  BP:  110/65  101/72  Pulse:  87  64  Resp:  (!) 28    Temp:  98.9 F (37.2 C)  (!) 97.5 F (36.4 C)  TempSrc:  Oral  Oral  SpO2: 99% 95% 95% 95%  Weight:  59.1 kg (130 lb 3.2 oz)    Height:        Intake/Output Summary (Last 24 hours) at 04/24/2017 1720 Last data filed at 04/24/2017 1400 Gross per 24 hour  Intake 967 ml  Output 400 ml  Net 567 ml   Filed Weights   04/23/17 1304 04/23/17 1717 04/24/17 0410  Weight: 57.2 kg (126 lb 3.2 oz) 59.2 kg (130 lb 9.6 oz) 59.1 kg (130 lb 3.2 oz)    Examination:  General exam: Appears calm and comfortable, in no acute distress  Respiratory system: Clear to auscultation. Respiratory effort normal.equal chest rise Cardiovascular system: S1 & S2 heard, no   rubs Gastrointestinal system: Abdomen is nondistended, soft and nontender. No organomegaly or masses felt. Normal bowel sounds heard. Central nervous system: Alert and oriented. No focal neurological deficits. Extremities: Symmetric 5 x 5 power. Skin: warm, dry  Mood & affect appropriate.     Data Reviewed: I have personally reviewed following labs and imaging studies  CBC: Recent Labs  Lab 04/23/17 1341 04/24/17 0403  WBC 12.9* 9.6  NEUTROABS 10.1*  --   HGB 14.0 11.9*  HCT 43.9 36.9*  MCV 92.4 92.7  PLT 228 751   Basic Metabolic Panel: Recent Labs  Lab 04/23/17 1341 04/24/17 0403  NA 135 136  K 4.2 4.5  CL 98* 102  CO2 24 27  GLUCOSE 110* 103*  BUN 27* 29*  CREATININE 0.87 0.75  CALCIUM 8.9 8.5*  MG 2.0  --    GFR: Estimated Creatinine Clearance: 62.6 mL/min (by C-G formula based on SCr of 0.75 mg/dL). Liver Function Tests: No results for input(s): AST, ALT, ALKPHOS, BILITOT, PROT, ALBUMIN in the last 168 hours. No results for input(s): LIPASE, AMYLASE in the last 168 hours. No results for input(s): AMMONIA in the last 168 hours. Coagulation Profile: No results for input(s): INR, PROTIME in the last 168 hours. Cardiac Enzymes: Recent Labs  Lab 04/23/17 1719 04/23/17 2349 04/24/17 0403  TROPONINI <0.03 <0.03 <0.03   BNP (last 3 results) No results for input(s): PROBNP in the last 8760 hours. HbA1C: No  results for input(s): HGBA1C in the last 72 hours. CBG: No results for input(s): GLUCAP in the last 168 hours. Lipid Profile: No results for input(s): CHOL, HDL, LDLCALC, TRIG, CHOLHDL, LDLDIRECT in the last 72 hours. Thyroid Function Tests: No results for input(s): TSH, T4TOTAL, FREET4, T3FREE, THYROIDAB in the last 72 hours. Anemia Panel: No results for input(s): VITAMINB12, FOLATE, FERRITIN, TIBC, IRON, RETICCTPCT in the last 72 hours. Sepsis Labs: No results for input(s): PROCALCITON, LATICACIDVEN in the last 168 hours.  No results found for this  or any previous visit (from the past 240 hour(s)).       Radiology Studies: Dg Chest Portable 1 View  Result Date: 04/23/2017 CLINICAL DATA:  STEMI/AFib EXAM: PORTABLE CHEST 1 VIEW COMPARISON:  CT 02/21/2017, radiograph 01/21/2017 FINDINGS: Mild right apical pleural and parenchymal scarring. Cardiomegaly. Possible small left effusion. Airspace disease at the left lung base. Aortic atherosclerosis. No pneumothorax. Valvular prosthesis. IMPRESSION: 1. Cardiomegaly 2. Airspace disease at the left lung base may reflect atelectasis or pneumonia. Possible small left pleural effusion. Electronically Signed   By: Donavan Foil M.D.   On: 04/23/2017 14:24        Scheduled Meds: . acetaminophen  500 mg Oral Daily   And  . acetaminophen  1,000 mg Oral QHS  . aspirin EC  81 mg Oral Daily  . famotidine  20 mg Oral Daily  . feeding supplement  1 Container Oral BID BM  . feeding supplement (ENSURE ENLIVE)  237 mL Oral Q24H  . hydrocerin   Topical Daily  . ibuprofen  600 mg Oral TID  . latanoprost  1 drop Both Eyes QHS  . pravastatin  40 mg Oral QPM  . timolol  1 drop Both Eyes BID   Continuous Infusions:   LOS: 0 days    Time spent: 10 min    Velvet Bathe, MD [facility] Pager [phone]  If 7PM-7AM, please contact night-coverage [email or web address] Password [email or web address] 04/24/2017, 5:20 PM

## 2017-04-24 NOTE — Progress Notes (Signed)
Initial Nutrition Assessment  DOCUMENTATION CODES:   Severe malnutrition in context of chronic illness  INTERVENTION:    Ensure Enlive po TID, each supplement provides 350 kcal and 20 grams of protein  NUTRITION DIAGNOSIS:   Severe Malnutrition related to chronic illness(CAD) as evidenced by severe muscle depletion, severe fat depletion.  GOAL:   Patient will meet greater than or equal to 90% of their needs  MONITOR:   PO intake, Supplement acceptance  REASON FOR ASSESSMENT:   Malnutrition Screening Tool    ASSESSMENT:   82 yo male with PMH of cataracts, glaucoma, HLD, BPH, MVP, HTN, CAD, aortic stenosis s/p TAVR, pericardial effusion, and arthritis who was admitted on 4/10 with dizziness and presyncope related to new onset A fib.  Patient reports that he has been eating okay. He lost ~30 lbs last fall when he had 3 surgeries. He is up to 130 lbs now; he reports usual weight is 150 lbs. Per review of weight encounters, he weighed ~127 lbs 6 months ago. He drinks Ensure sometimes at home and has been forcing himself to eat 3 meals per day.  Labs and medications reviewed.  NUTRITION - FOCUSED PHYSICAL EXAM:    Most Recent Value  Orbital Region  Severe depletion  Upper Arm Region  Severe depletion  Thoracic and Lumbar Region  Moderate depletion  Buccal Region  Moderate depletion  Temple Region  Moderate depletion  Clavicle Bone Region  Moderate depletion  Clavicle and Acromion Bone Region  Severe depletion  Scapular Bone Region  Moderate depletion  Dorsal Hand  Severe depletion  Patellar Region  Severe depletion  Anterior Thigh Region  Severe depletion  Posterior Calf Region  Severe depletion  Edema (RD Assessment)  None  Hair  Reviewed  Eyes  Reviewed  Mouth  Reviewed  Skin  Reviewed  Nails  Reviewed       Diet Order:  Diet Heart Room service appropriate? Yes; Fluid consistency: Thin  EDUCATION NEEDS:   No education needs have been identified at this  time  Skin:  Skin Assessment: Reviewed RN Assessment  Last BM:  4/10  Height:   Ht Readings from Last 1 Encounters:  04/23/17 5\' 9"  (1.753 m)    Weight:   Wt Readings from Last 1 Encounters:  04/24/17 130 lb 3.2 oz (59.1 kg)    Ideal Body Weight:  72.7 kg  BMI:  Body mass index is 19.23 kg/m.  Estimated Nutritional Needs:   Kcal:  1800-2000  Protein:  90-100 gm  Fluid:  1.8-2 L    Molli Barrows, RD, LDN, [place] Pager [phone] After Hours Pager [phone]

## 2017-04-24 NOTE — Progress Notes (Signed)
Progress Note  Patient Name: [name] [name] Date of Encounter: 04/24/2017  Primary Cardiologist: No primary care provider on file.   Subjective   Yesterday evening was complaining of 4/10 left-sided chest pain when coughing.  Tylenol was ordered.  Inpatient Medications    Scheduled Meds: . acetaminophen  500 mg Oral Daily   And  . acetaminophen  1,000 mg Oral QHS  . aspirin EC  81 mg Oral Daily  . clopidogrel  75 mg Oral Daily  . feeding supplement  1 Container Oral BID BM  . feeding supplement (ENSURE ENLIVE)  237 mL Oral Q24H  . hydrocerin   Topical Daily  . ipratropium  0.5 mg Nebulization TID  . latanoprost  1 drop Both Eyes QHS  . pravastatin  40 mg Oral QPM  . timolol  1 drop Both Eyes BID   Continuous Infusions:  PRN Meds: dextromethorphan, ondansetron (ZOFRAN) IV   Vital Signs    Vitals:   04/23/17 1956 04/23/17 2014 04/24/17 0410 04/24/17 0942  BP: 132/77  110/65   Pulse: 78  87   Resp: 20  (!) 28   Temp: 98.2 F (36.8 C)  98.9 F (37.2 C)   TempSrc: Oral  Oral   SpO2: 97% 99% 95% 95%  Weight:   130 lb 3.2 oz (59.1 kg)   Height:        Intake/Output Summary (Last 24 hours) at 04/24/2017 1033 Last data filed at 04/24/2017 0900 Gross per 24 hour  Intake 1981 ml  Output 300 ml  Net 1681 ml   Filed Weights   04/23/17 1304 04/23/17 1717 04/24/17 0410  Weight: 126 lb 3.2 oz (57.2 kg) 130 lb 9.6 oz (59.2 kg) 130 lb 3.2 oz (59.1 kg)    Telemetry    AFIB 80-100 - Personally Reviewed  ECG    AFIB - Personally Reviewed  Physical Exam   GEN: No acute distress.  Thin Neck: No JVD Cardiac: RRR, no murmurs, + rub, no gallops.  Respiratory: Clear to auscultation bilaterally. GI: Soft, nontender, non-distended  MS: No edema; No deformity. Neuro:  Nonfocal  Psych: Normal affect   Labs    Chemistry Recent Labs  Lab 04/23/17 1341 04/24/17 0403  NA 135 136  K 4.2 4.5  CL 98* 102  CO2 24 27  GLUCOSE 110* 103*  BUN 27* 29*  CREATININE  0.87 0.75  CALCIUM 8.9 8.5*  GFRNONAA >60 >60  GFRAA >60 >60  ANIONGAP 13 7     Hematology Recent Labs  Lab 04/23/17 1341 04/24/17 0403  WBC 12.9* 9.6  RBC 4.75 3.98*  HGB 14.0 11.9*  HCT 43.9 36.9*  MCV 92.4 92.7  MCH 29.5 29.9  MCHC 31.9 32.2  RDW 15.1 15.6*  PLT 228 238    Cardiac Enzymes Recent Labs  Lab 04/23/17 1719 04/23/17 2349 04/24/17 0403  TROPONINI <0.03 <0.03 <0.03    Recent Labs  Lab 04/23/17 1347  TROPIPOC 0.02     BNPNo results for input(s): BNP, PROBNP in the last 168 hours.   DDimer No results for input(s): DDIMER in the last 168 hours.   Radiology    Dg Chest Portable 1 View  Result Date: 04/23/2017 CLINICAL DATA:  STEMI/AFib EXAM: PORTABLE CHEST 1 VIEW COMPARISON:  CT 02/21/2017, radiograph 01/21/2017 FINDINGS: Mild right apical pleural and parenchymal scarring. Cardiomegaly. Possible small left effusion. Airspace disease at the left lung base. Aortic atherosclerosis. No pneumothorax. Valvular prosthesis. IMPRESSION: 1. Cardiomegaly 2. Airspace disease at the  left lung base may reflect atelectasis or pneumonia. Possible small left pleural effusion. Electronically Signed   By: Donavan Foil M.D.   On: 04/23/2017 14:24    Cardiac Studies   Echocardiogram 04/23/17: Left ventricle: The cavity size was normal. There was severe   concentric hypertrophy. Systolic function was normal. The   estimated ejection fraction was in the range of 55% to 60%. Wall   motion was normal; there were no regional wall motion   abnormalities. The study was not technically sufficient to allow   evaluation of LV diastolic dysfunction due to atrial   fibrillation. - Aortic valve: There was no regurgitation. - Aortic root: The aortic root was normal in size. - Right ventricle: The cavity size was normal. Wall thickness was   normal. Systolic function was normal. - Right atrium: The atrium was normal in size. - Tricuspid valve: There was mild regurgitation. -  Pulmonary arteries: Systolic pressure was moderately increased.   PA peak pressure: 44 mm Hg (S). - Pericardium, extracardiac: A moderate pericardial effusion was   identified posterior to the heart. Features were not consistent   with tamponade physiology.  Impressions:  - When compared to the prior study from 01/30/2017 pericardial   effusion is sligtly larger, still moderate, no signs of   tamponade.  Patient Profile     82 y.o. male with atypical chest discomfort, normal troponins with prior T AVR, right coronary artery stent in 2018 with newly diagnosed atrial fibrillation.  Assessment & Plan    Atypical chest pain -Associated with coughing, likely both musculoskeletal as well as pleuritic.  He has been coughing since November he states when he had pneumonia.  It may make sense for [place] to treat with anti-inflammatories such as ibuprofen. -No need for further invasive or noninvasive evaluation  This has been going on for quite some time.   Moderate pericardial effusion -He has had a chronic pericardial effusion previously described as mild, now moderate with no evidence of tamponade physiology.  It may make sense for [place] to treat with anti-inflammatory prior to starting on full dose anticoagulation in the setting of atrial fibrillation due to potential of hemorrhagic transformation.  - I will give him Ibuprofen 600 TID with Pepcid 20 QD  - Stop Plavix. Continue low dose ASA 81. It has been nearly a year since stent placement and TAVR.   - Repeat echo limited in 14 days. Will call clinic.   40 pound weight loss -Overall deconditioned, decline over the last several months.  Continue to encourage protein.  New onset paroxysmal atrial fibrillation -Before starting anticoagulation with Eliquis, I would like to give him a trial of anti-inflammatories, ibuprofen to see if this helps with his overall pleuritic-like condition and increasing although moderate pericardial effusion.  I would  be concerned about potential for hemorrhagic transformation if this is in fact in part pleuritic.  - after trial of ibuprofen, would start eliquis 2.5mg  PO BID monotherapy. Will set up follow up appt.   Multiple pulmonary nodules -Last CT requested a repeat in 3-6 months.  For questions or updates, please contact [place] Please consult [email or web address] for contact info under Cardiology/STEMI.      Signed, Candee Furbish, MD  04/24/2017, 10:33 AM

## 2017-04-25 DIAGNOSIS — I4891 Unspecified atrial fibrillation: Secondary | ICD-10-CM | POA: Diagnosis not present

## 2017-04-25 DIAGNOSIS — I313 Pericardial effusion (noninflammatory): Secondary | ICD-10-CM | POA: Diagnosis not present

## 2017-04-25 MED ORDER — ACETAMINOPHEN 500 MG PO TABS: 1000.0000 mg | ORAL_TABLET | Freq: Every day | ORAL | 0 refills | Status: DC

## 2017-04-25 MED ORDER — ACETAMINOPHEN 500 MG PO TABS: 500.0000 mg | ORAL_TABLET | Freq: Every day | ORAL | 0 refills | Status: DC

## 2017-04-25 MED ORDER — IBUPROFEN 600 MG PO TABS: 600.0000 mg | ORAL_TABLET | Freq: Three times a day (TID) | ORAL | 0 refills | Status: AC

## 2017-04-25 MED ORDER — FAMOTIDINE 20 MG PO TABS: 20.0000 mg | ORAL_TABLET | Freq: Every day | ORAL | 0 refills | Status: DC

## 2017-04-25 NOTE — Progress Notes (Signed)
Progress Note  Patient Name: [name] [name] Date of Encounter: 04/25/2017  Primary Cardiologist: No primary care provider on file.   Subjective   Feeling better. Less pain with coughing.   Inpatient Medications    Scheduled Meds: . acetaminophen  500 mg Oral Daily   And  . acetaminophen  1,000 mg Oral QHS  . aspirin EC  81 mg Oral Daily  . famotidine  20 mg Oral Daily  . feeding supplement  1 Container Oral BID BM  . feeding supplement (ENSURE ENLIVE)  237 mL Oral Q24H  . hydrocerin   Topical Daily  . ibuprofen  600 mg Oral TID  . latanoprost  1 drop Both Eyes QHS  . pravastatin  40 mg Oral QPM  . timolol  1 drop Both Eyes BID   Continuous Infusions:  PRN Meds: dextromethorphan, ipratropium, levalbuterol, ondansetron (ZOFRAN) IV   Vital Signs    Vitals:   04/24/17 2033 04/24/17 2340 04/25/17 0532 04/25/17 0844  BP: 109/63  113/72 113/71  Pulse: 70  87 78  Resp: 18  16   Temp: 98.4 F (36.9 C) 98.3 F (36.8 C) 97.6 F (36.4 C)   TempSrc: Oral Oral Oral   SpO2: 93%  99% 94%  Weight:   132 lb 6.4 oz (60.1 kg)   Height:        Intake/Output Summary (Last 24 hours) at 04/25/2017 0851 Last data filed at 04/24/2017 2340 Gross per 24 hour  Intake 494 ml  Output 400 ml  Net 94 ml   Filed Weights   04/23/17 1717 04/24/17 0410 04/25/17 0532  Weight: 130 lb 9.6 oz (59.2 kg) 130 lb 3.2 oz (59.1 kg) 132 lb 6.4 oz (60.1 kg)    Telemetry    AFIB 80-100, rare ectopy rate controlled - Personally Reviewed  ECG    AFIB - Personally Reviewed  Physical Exam   GEN: Thin, in no acute distress  HEENT: normal  Neck: no JVD, carotid bruits, or masses Cardiac: irreg ; no murmurs, rubs, or gallops,no edema  Respiratory:  clear to auscultation bilaterally, normal work of breathing GI: soft, nontender, nondistended, + BS MS: no deformity or atrophy  Skin: warm and dry, no rash Neuro:  Alert and Oriented x 3, Strength and sensation are intact Psych: euthymic mood,  full affect   Labs    Chemistry Recent Labs  Lab 04/23/17 1341 04/24/17 0403  NA 135 136  K 4.2 4.5  CL 98* 102  CO2 24 27  GLUCOSE 110* 103*  BUN 27* 29*  CREATININE 0.87 0.75  CALCIUM 8.9 8.5*  GFRNONAA >60 >60  GFRAA >60 >60  ANIONGAP 13 7     Hematology Recent Labs  Lab 04/23/17 1341 04/24/17 0403  WBC 12.9* 9.6  RBC 4.75 3.98*  HGB 14.0 11.9*  HCT 43.9 36.9*  MCV 92.4 92.7  MCH 29.5 29.9  MCHC 31.9 32.2  RDW 15.1 15.6*  PLT 228 238    Cardiac Enzymes Recent Labs  Lab 04/23/17 1719 04/23/17 2349 04/24/17 0403  TROPONINI <0.03 <0.03 <0.03    Recent Labs  Lab 04/23/17 1347  TROPIPOC 0.02     BNPNo results for input(s): BNP, PROBNP in the last 168 hours.   DDimer No results for input(s): DDIMER in the last 168 hours.   Radiology    Dg Chest Portable 1 View  Result Date: 04/23/2017 CLINICAL DATA:  STEMI/AFib EXAM: PORTABLE CHEST 1 VIEW COMPARISON:  CT 02/21/2017, radiograph 01/21/2017 FINDINGS: Mild  right apical pleural and parenchymal scarring. Cardiomegaly. Possible small left effusion. Airspace disease at the left lung base. Aortic atherosclerosis. No pneumothorax. Valvular prosthesis. IMPRESSION: 1. Cardiomegaly 2. Airspace disease at the left lung base may reflect atelectasis or pneumonia. Possible small left pleural effusion. Electronically Signed   By: Donavan Foil M.D.   On: 04/23/2017 14:24    Cardiac Studies   Echocardiogram 04/23/17: Left ventricle: The cavity size was normal. There was severe   concentric hypertrophy. Systolic function was normal. The   estimated ejection fraction was in the range of 55% to 60%. Wall   motion was normal; there were no regional wall motion   abnormalities. The study was not technically sufficient to allow   evaluation of LV diastolic dysfunction due to atrial   fibrillation. - Aortic valve: There was no regurgitation. - Aortic root: The aortic root was normal in size. - Right ventricle: The cavity  size was normal. Wall thickness was   normal. Systolic function was normal. - Right atrium: The atrium was normal in size. - Tricuspid valve: There was mild regurgitation. - Pulmonary arteries: Systolic pressure was moderately increased.   PA peak pressure: 44 mm Hg (S). - Pericardium, extracardiac: A moderate pericardial effusion was   identified posterior to the heart. Features were not consistent   with tamponade physiology.  Impressions:  - When compared to the prior study from 01/30/2017 pericardial   effusion is sligtly larger, still moderate, no signs of   tamponade.  Patient Profile     82 y.o. male with atypical chest discomfort, normal troponins with prior T AVR, right coronary artery stent in 2018 with newly diagnosed atrial fibrillation.  Assessment & Plan    Atypical chest pain -Associated with coughing, likely both musculoskeletal as well as pleuritic.  He has been coughing since November he states when he had pneumonia.  It may make sense for [place] to treat with anti-inflammatories such as ibuprofen. The ibuprofen started yesterday, 4/11, seems to be helping. Less pain. -No need for further invasive or noninvasive evaluation  This has been going on for quite some time.   Moderate pericardial effusion -He has had a chronic pericardial effusion previously described as mild, now moderate with no evidence of tamponade physiology.  It may make sense for [place] to treat with anti-inflammatory prior to starting on full dose anticoagulation in the setting of atrial fibrillation due to potential of hemorrhagic transformation.  - I will give him Ibuprofen 600 TID with Pepcid 20 QD, continue for a total of 10 days (now on day 2)  - Stopped Plavix. Continue low dose ASA 81. It has been nearly a year since stent placement and TAVR.   - Repeat echo limited in 14 days. Clinic to call with final appts.   40 pound weight loss -Overall deconditioned, decline over the last several months.   Continue to encourage protein.  New onset paroxysmal atrial fibrillation -Before starting anticoagulation with Eliquis, I would like to give him a trial of anti-inflammatories, ibuprofen to see if this helps with his overall pleuritic-like condition and increasing although moderate pericardial effusion.  I would be concerned about potential for hemorrhagic transformation if this is in fact in part pleuritic.  - after trial of ibuprofen, would start eliquis 2.5mg  PO BID monotherapy. Will set up follow up appt. Understands stroke risk.   Multiple pulmonary nodules -Last CT requested a repeat in 3-6 months.  From our aspect, OK for DC.   For questions or updates,  please contact [place] Please consult [email or web address] for contact info under Cardiology/STEMI.      Signed, Candee Furbish, MD  04/25/2017, 8:51 AM

## 2017-04-25 NOTE — Progress Notes (Addendum)
Discharge instruction reviewed with pt. Pt has no questions at this time. Pt stated he would schedule a follow up appointment with the cardiologist. Printed prescription given to pt. IV d/c. Pt stated he is ready to go home.

## 2017-04-25 NOTE — Discharge Summary (Signed)
Physician Discharge Summary  [name] [name] ATF:[id number] DOB: 1935/08/30 DOA: 04/23/2017  PCP: [name] Dus, MD  Admit date: 04/23/2017 Discharge date: 04/25/2017  Time spent: 20 minutes  Recommendations for Outpatient Follow-up:  1. Please follow up with the cardiologist 2. Pt to get 10 days of NSAID prior to transitioning to Eliquis  3. Multiple pulmonary nodules -Last CT requested a repeat in 3-6 months.   Discharge Diagnoses:  Principal Problem:   New onset atrial fibrillation [facility]) Active Problems:   Aortic stenosis   Benign essential HTN   Coronary artery disease involving native coronary artery of native heart without angina pectoris   Pericardial effusion   Postural dizziness with presyncope   Protein-calorie malnutrition, severe   Discharge Condition: stable  Diet recommendation: heart healthy  Filed Weights   04/23/17 1717 04/24/17 0410 04/25/17 0532  Weight: 59.2 kg (130 lb 9.6 oz) 59.1 kg (130 lb 3.2 oz) 60.1 kg (132 lb 6.4 oz)    History of present illness:  82 y/o presenting with dizziness and presyncope diagnosed with new onset afib. Pt has history of chronic pericardial effusion.  Cardiology evaluated and recommended the following below.  Hospital Course:  Principal Problem:   New onset atrial fibrillation [name]) - Cardiology consulted and managed. They recommended the following:  Atypical chest pain -Associated with coughing, likely both musculoskeletal as well as pleuritic.  He has been coughing since November he states when he had pneumonia.  82 may make sense for [place] to treat with anti-inflammatories such as ibuprofen. The ibuprofen started yesterday, 4/11, seems to be helping. Less pain. -No need for further invasive or noninvasive evaluation  This has been going on for quite some time.   Moderate pericardial effusion -He has had a chronic pericardial effusion previously described as mild, now moderate with no evidence of tamponade  physiology.  It may make sense for [place] to treat with anti-inflammatory prior to starting on full dose anticoagulation in the setting of atrial fibrillation due to potential of hemorrhagic transformation.  - 82 will give him Ibuprofen 600 TID with Pepcid 20 QD, continue for a total of 10 days (now on day 2)  - Stopped Plavix. Continue low dose ASA 81. It has been nearly a year since stent placement and TAVR.   - Repeat echo limited in 80 days. Clinic to call with final appts.   40 pound weight loss -Overall deconditioned, decline over the last several months.  Continue to encourage protein.  New onset paroxysmal atrial fibrillation -Before starting anticoagulation with Eliquis, I would like to give him a trial of anti-inflammatories, ibuprofen to see if this helps with his overall pleuritic-like condition and increasing although moderate pericardial effusion.  I would be concerned about potential for hemorrhagic transformation if this is in fact in part pleuritic.  - after trial of ibuprofen, would start eliquis 2.5mg  PO BID monotherapy. Will set up follow up appt. Understands stroke risk.    Active Problems:    Aortic stenosis   Benign essential HTN   Coronary artery disease involving native coronary artery of native heart without angina pectoris   Pericardial effusion - s/p echocardiogram to decide on whether or not to place on anticoagulation    Postural dizziness with presyncope - most likely related to new onset atrial fibrillation, resolved    Protein-calorie malnutrition, severe - continue nutritional supplementation  Procedures:  Echo  Consultations:  Cardiology  Discharge Exam: Vitals:   04/25/17 0844 04/25/17 1236  BP: 113/71 114/85  Pulse:  78 75  Resp:    Temp:    SpO2: 94% 97%    General: Pt in nad, alert and awake Cardiovascular: no cyanosis Respiratory: no audible wheezes  Discharge Instructions   Discharge Instructions    Call MD for:  extreme  fatigue   Complete by:  As directed    Call MD for:  severe uncontrolled pain   Complete by:  As directed    Call MD for:  temperature >100.4   Complete by:  As directed    Diet - low sodium heart healthy   Complete by:  As directed    Discharge instructions   Complete by:  As directed    Please call cardiologist office after hospital discharge to confirm appointment date and time.   Increase activity slowly   Complete by:  As directed      Allergies as of 04/25/2017   No Known Allergies     Medication List    STOP taking these medications   cetaphil cream   clopidogrel 75 MG tablet Commonly known as:  PLAVIX   dextromethorphan 30 MG/5ML liquid Commonly known as:  DELSYM   dextromethorphan-guaiFENesin 30-600 MG 12hr tablet Commonly known as:  [place] DM     TAKE these medications   acetaminophen 500 MG tablet Commonly known as:  TYLENOL Take 2 tablets (1,000 mg total) by mouth at bedtime. What changed:  You were already taking a medication with the same name, and this prescription was added. Make sure you understand how and when to take each.   acetaminophen 500 MG tablet Commonly known as:  TYLENOL Take 1 tablet (500 mg total) by mouth daily. Start taking on:  04/26/2017 What changed:    when to take this  reasons to take this   aspirin EC 81 MG tablet Take 81 mg daily by mouth.   famotidine 20 MG tablet Commonly known as:  PEPCID Take 1 tablet (20 mg total) by mouth daily for 10 days. Start taking on:  04/26/2017   feeding supplement (ENSURE ENLIVE) Liqd Take 237 mLs by mouth daily.   feeding supplement Liqd Take 1 Container by mouth 2 (two) times daily between meals.   fluticasone 50 MCG/ACT nasal spray Commonly known as:  FLONASE Place 1 spray into both nostrils daily.   ibuprofen 600 MG tablet Commonly known as:  ADVIL,MOTRIN Take 1 tablet (600 mg total) by mouth 3 (three) times daily for 9 days.   latanoprost 0.005 % ophthalmic  solution Commonly known as:  XALATAN Place 1 drop into both eyes at bedtime.   pravastatin 40 MG tablet Commonly known as:  PRAVACHOL Take 40 mg by mouth every evening.   timolol 0.5 % ophthalmic solution Commonly known as:  BETIMOL Place 1 drop into both eyes 2 (two) times daily.      No Known Allergies    The results of significant diagnostics from this hospitalization (including imaging, microbiology, ancillary and laboratory) are listed below for reference.    Significant Diagnostic Studies: Ct Abdomen W Wo Contrast  Result Date: 04/15/2017 CLINICAL DATA:  Evaluate pancreas lesion. EXAM: CT ABDOMEN WITHOUT AND WITH CONTRAST TECHNIQUE: Multidetector CT imaging of the abdomen was performed following the standard protocol before and following the bolus administration of intravenous contrast. CONTRAST:  136mL ISOVUE-300 IOPAMIDOL (ISOVUE-300) INJECTION 61% COMPARISON:  MRI 10/17/2015. FINDINGS: Lower chest: No pleural effusion identified. Cardiac enlargement identified. Moderate pericardial effusion is identified. Similar to 02/21/2017. Calcifications within the RCA coronary artery noted. Again noted  is a mosaic attenuation pattern within both lungs. There is diffuse bronchial wall thickening noted. Hepatobiliary: Simple appearing cyst identified within the left lobe of liver measuring 1 cm. No suspicious liver abnormalities. The gallbladder appears normal. No biliary dilatation. Pancreas: No pancreatic inflammation identified. Previously characterized small cystic lesion within body of the pancreas measures 8 mm, image 35/4. Stable from 10/17/2015. No pancreatic duct dilatation or solid mass identified. Spleen: Normal in size without focal abnormality. Adrenals/Urinary Tract: Adrenal glands are unremarkable. No kidney mass or hydronephrosis. 7 mm low-density structure within the interpolar right kidney is unchanged and remains too small to reliably characterize, image 55/4. Stomach/Bowel:  Stomach is within normal limits. No evidence of bowel wall thickening, distention, or inflammatory changes. Vascular/Lymphatic: Aortic atherosclerosis. No aneurysm. No upper abdominal adenopathy. Other: No free fluid or fluid collections. Musculoskeletal: Mild degenerative changes noted within the lower thoracic and lumbar spine. IMPRESSION: 1. Stable appearance of cystic lesion involving the body of pancreas measuring 7 mm. This is remain unchanged from 10/17/2015. Followup examination in 24 months is advised to ensure ongoing stability. This recommendation follows ACR consensus guidelines: Management of Incidental Pancreatic Cysts: A White Paper of the ACR Incidental Findings Committee. J Am Coll Radiol 1610;[id number]. 2. Pericardial effusion, similar to 02/21/2017. 3.  Aortic Atherosclerosis (ICD10-I70.0). Electronically Signed   By: Kerby Moors M.D.   On: 04/15/2017 12:12   Dg Chest Portable 1 View  Result Date: 04/23/2017 CLINICAL DATA:  STEMI/AFib EXAM: PORTABLE CHEST 1 VIEW COMPARISON:  CT 02/21/2017, radiograph 01/21/2017 FINDINGS: Mild right apical pleural and parenchymal scarring. Cardiomegaly. Possible small left effusion. Airspace disease at the left lung base. Aortic atherosclerosis. No pneumothorax. Valvular prosthesis. IMPRESSION: 1. Cardiomegaly 2. Airspace disease at the left lung base may reflect atelectasis or pneumonia. Possible small left pleural effusion. Electronically Signed   By: Donavan Foil M.D.   On: 04/23/2017 14:24    Microbiology: No results found for this or any previous visit (from the past 240 hour(s)).   Labs: Basic Metabolic Panel: Recent Labs  Lab 04/23/17 1341 04/24/17 0403  NA 135 136  K 4.2 4.5  CL 98* 102  CO2 24 27  GLUCOSE 110* 103*  BUN 27* 29*  CREATININE 0.87 0.75  CALCIUM 8.9 8.5*  MG 2.0  --    Liver Function Tests: No results for input(s): AST, ALT, ALKPHOS, BILITOT, PROT, ALBUMIN in the last 168 hours. No results for input(s): LIPASE,  AMYLASE in the last 168 hours. No results for input(s): AMMONIA in the last 168 hours. CBC: Recent Labs  Lab 04/23/17 1341 04/24/17 0403  WBC 12.9* 9.6  NEUTROABS 10.1*  --   HGB 14.0 11.9*  HCT 43.9 36.9*  MCV 92.4 92.7  PLT 228 238   Cardiac Enzymes: Recent Labs  Lab 04/23/17 1719 04/23/17 2349 04/24/17 0403  TROPONINI <0.03 <0.03 <0.03   BNP: BNP (last 3 results) No results for input(s): BNP in the last 8760 hours.  ProBNP (last 3 results) No results for input(s): PROBNP in the last 8760 hours.  CBG: No results for input(s): GLUCAP in the last 168 hours.     Signed:  Velvet Bathe MD.  [facility] 04/25/2017, 3:08 PM

## 2017-04-28 ENCOUNTER — Telehealth: Payer: Self-pay

## 2017-04-28 DIAGNOSIS — I313 Pericardial effusion (noninflammatory): Secondary | ICD-10-CM

## 2017-04-28 DIAGNOSIS — I3139 Other pericardial effusion (noninflammatory): Secondary | ICD-10-CM

## 2017-04-28 NOTE — Telephone Encounter (Signed)
I spoke with patient regarding recommendation for echo to evaluate pericardial effusion. Order placed to be scheduled. Patient verbalized understanding and thankful for the call.

## 2017-04-28 NOTE — Telephone Encounter (Signed)
-----   Message from Theodoro Parma, RN sent at 04/24/2017 11:02 AM EDT ----- Regarding: FW: ECHO This is [facility]'s patient. He is still hospitalized. See below.  Thanks! ----- Message ----- From: Jerline Pain, MD Sent: 04/24/2017  10:53 AM To: [facility] Triage Subject: ECHO                                           Please set up limited echo in 14 days to evaluate pericardial effusion.  Thx  Candee Furbish, MD

## 2017-05-06 ENCOUNTER — Ambulatory Visit (HOSPITAL_COMMUNITY): Payer: BC Managed Care – PPO | Attending: Cardiovascular Disease

## 2017-05-06 DIAGNOSIS — I10 Essential (primary) hypertension: Secondary | ICD-10-CM | POA: Insufficient documentation

## 2017-05-06 DIAGNOSIS — I2510 Atherosclerotic heart disease of native coronary artery without angina pectoris: Secondary | ICD-10-CM | POA: Insufficient documentation

## 2017-05-06 DIAGNOSIS — I313 Pericardial effusion (noninflammatory): Secondary | ICD-10-CM | POA: Diagnosis not present

## 2017-05-06 DIAGNOSIS — E785 Hyperlipidemia, unspecified: Secondary | ICD-10-CM | POA: Diagnosis not present

## 2017-05-06 DIAGNOSIS — I3139 Other pericardial effusion (noninflammatory): Secondary | ICD-10-CM

## 2017-05-08 ENCOUNTER — Other Ambulatory Visit: Payer: Self-pay | Admitting: Cardiology

## 2017-05-08 ENCOUNTER — Telehealth: Payer: Self-pay

## 2017-05-08 ENCOUNTER — Other Ambulatory Visit: Payer: BC Managed Care – PPO | Admitting: *Deleted

## 2017-05-08 DIAGNOSIS — I313 Pericardial effusion (noninflammatory): Secondary | ICD-10-CM

## 2017-05-08 DIAGNOSIS — I3139 Other pericardial effusion (noninflammatory): Secondary | ICD-10-CM

## 2017-05-08 MED ORDER — IBUPROFEN 200 MG PO TABS: 600.0000 mg | ORAL_TABLET | Freq: Three times a day (TID) | ORAL | 0 refills | Status: DC

## 2017-05-08 MED ORDER — COLCHICINE 0.6 MG PO TABS: 0.6000 mg | ORAL_TABLET | Freq: Two times a day (BID) | ORAL | 0 refills | Status: DC

## 2017-05-08 NOTE — Telephone Encounter (Signed)
Left message to call back  

## 2017-05-08 NOTE — Telephone Encounter (Signed)
Hold of on Eliquis for 2 more weeks and see back in office at that time

## 2017-05-08 NOTE — Telephone Encounter (Signed)
Notes recorded by Teressa Senter, RN on 05/08/2017 at 9:17 AM EDT Patient made aware of echo results and Dr. Theodosia Blender recommendation for ANA, rheumatoid factor, sed rate and CRP due to current pericardial effusion. Patient stated he could come in today for blood draw. I also instructed patient to START colchicine 0.6 mg two times a day for 3 months and Motrin 600 mg three times a day for 2 weeks. Repeat echo in 2 weeks to reassess effusion size. I informed patient to continue taking Pepcid AC 20 mg daily while on NSAID.   I also informed patient that Dr. Radford Pax has referred him to Dr. Chase Caller, pulmonologist due to abnormal lung findings on chest CT.   Patient in agreement with plan, verbalized understanding and thankful for the call. Echo ordered to be scheduled in 2 weeks.   Notes recorded by Sueanne Margarita, MD on 05/07/2017 at 5:33 PM EDT 2D echocardiogram showed mild LVH with normal LV function and normal wall motion with mild MR. RV systolic function was mildly reduced. Stable moderate posterior pericardial effusion with no Tamponade. Please get an ANA, rheumatoid factor, sed rate and CRP. Unclear etiology of pericardial effusion. HIV last year was normal. He has multiple abnormal findings on chest CT regarding his lungs and I would like a referral to see Dr. Chase Caller in the office. Please start him on colchicine 0.6 mg twice daily 3 months and Motrin 600 mg 3 times daily for 2 weeks and repeat 2D echocardiogram to assess for reduction in effusion size. These have him take over-the-counter Pepcid AC 20 mg daily for GI prophylaxis while on the NSAIDs.

## 2017-05-09 LAB — C-REACTIVE PROTEIN: CRP: 48.6 mg/L — ABNORMAL HIGH (ref 0.0–4.9)

## 2017-05-09 LAB — SEDIMENTATION RATE: SED RATE: 58 mm/h — AB (ref 0–30)

## 2017-05-09 LAB — ANA: ANA: NEGATIVE

## 2017-05-09 LAB — RHEUMATOID FACTOR

## 2017-05-13 NOTE — Telephone Encounter (Signed)
Left message with patient per Dr. Radford Pax continue to hold Eliquis for 2 weeks and call back to schedule f/u appt in 2 weeks.

## 2017-05-13 NOTE — Telephone Encounter (Signed)
Patient returned call. I informed patient of Dr. Theodosia Blender recommendation to hold Eliquis for 2 weeks while taking NSAID and follow up in office with PA. Patient states he is having some LE swelling. He is having some increased swelling throughout the day that is resolved overnight. Patient stated he has been eating some canned soup. I informed patient to be mindful of sodium intake that is in canned food. Patient verbalized understanding. Patient is scheduled with Melina Copa, PA on 05/28/17. Patient in agreement with treatment plan and thankful for the call.

## 2017-05-16 ENCOUNTER — Other Ambulatory Visit: Payer: Self-pay

## 2017-05-16 ENCOUNTER — Ambulatory Visit (INDEPENDENT_AMBULATORY_CARE_PROVIDER_SITE_OTHER): Payer: BC Managed Care – PPO | Admitting: Cardiology

## 2017-05-16 ENCOUNTER — Ambulatory Visit
Admission: RE | Admit: 2017-05-16 | Discharge: 2017-05-16 | Disposition: A | Discharge status: Home / Self Care | Payer: BC Managed Care – PPO | Source: Ambulatory Visit | Attending: Cardiology | Admitting: Cardiology

## 2017-05-16 ENCOUNTER — Encounter: Payer: Self-pay | Admitting: Cardiology

## 2017-05-16 ENCOUNTER — Other Ambulatory Visit: Payer: Self-pay | Admitting: Cardiology

## 2017-05-16 VITALS — BP 130/74 | HR 80 | Ht 69.0 in | Wt 150.0 lb

## 2017-05-16 DIAGNOSIS — I2510 Atherosclerotic heart disease of native coronary artery without angina pectoris: Secondary | ICD-10-CM

## 2017-05-16 DIAGNOSIS — I313 Pericardial effusion (noninflammatory): Secondary | ICD-10-CM

## 2017-05-16 DIAGNOSIS — I3139 Other pericardial effusion (noninflammatory): Secondary | ICD-10-CM

## 2017-05-16 DIAGNOSIS — R0602 Shortness of breath: Secondary | ICD-10-CM

## 2017-05-16 DIAGNOSIS — E7800 Pure hypercholesterolemia, unspecified: Secondary | ICD-10-CM

## 2017-05-16 DIAGNOSIS — I4891 Unspecified atrial fibrillation: Secondary | ICD-10-CM

## 2017-05-16 DIAGNOSIS — I10 Essential (primary) hypertension: Secondary | ICD-10-CM | POA: Diagnosis not present

## 2017-05-16 DIAGNOSIS — I350 Nonrheumatic aortic (valve) stenosis: Secondary | ICD-10-CM | POA: Diagnosis not present

## 2017-05-16 MED ORDER — FUROSEMIDE 20 MG PO TABS: 20.0000 mg | ORAL_TABLET | Freq: Every day | ORAL | 3 refills | Status: DC

## 2017-05-16 MED ORDER — FAMOTIDINE 20 MG PO TABS: 20.0000 mg | ORAL_TABLET | Freq: Every day | ORAL | 0 refills | Status: DC

## 2017-05-16 NOTE — Progress Notes (Signed)
Cardiology Office Note:    Date:  05/16/2017   ID:  QUSAI KEM, DOB [birth year], MRN [id number]  PCP:  Maury Dus, MD  Cardiologist:  No primary care provider on file.    Referring MD: Maury Dus, MD   Chief Complaint  Patient presents with  . Follow-up    pericardial effusion, PAD    History of Present Illness:    [name] [name] is a 82 y.o. male with a hx of dyslipidemia and family history of CAD, bicuspid aortic valve with moderate AS and mildly dilated aortic root and mild MVP. His AS progressed over the past year and was referred to Dr. Burt Knack for evaluation of severe AS.  LHC showed nonobstructive ASCAD in the LAD and ramus with hemodynamically significant RCA stenosis by FFR and underwent PCI of the RCA with DES.  He subsequently underwent TAVR 07/02/2016. He has a chronic pericardial effusion that was moderate on the day of the TAVR with no tamponade and had been present prior to the TAVR.  He was admitted last month with chest pain and syncope and was found to be in new onset atrial fibrillation.  He apparently had also complained of losing 40 pounds over the past year that was unintentional.  Pulmonary nodules are noted on chest CT back in February 2019.  It was felt that his chest pain is atypical and likely musculoskeletal as well as a pleuritic component.  He was started on ibuprofen 600 mg 3 times daily along with Pepcid for 10 days.  His Plavix was stopped.  It was recommended that before starting anticoagulation with Eliquis he be treated for 2 weeks with anti-inflammatories due to the pleuritic condition of his chest pain as well as his pericardial effusion.  Was also recommended to have a follow-up chest CT in 3 to 6 months for his multiple pulmonary nodules.  He is here today for followup.  He has been recently having some lower extremity edema as well as abdominal fullness and shortness of breath.  He is actually gained weight 18 pounds since last month.  He denies  any chest pain or pressure,  PND, orthopnea,dizziness, palpitations or syncope. He is compliant with his meds and is tolerating meds with no SE.      Past Medical History:  Diagnosis Date  . Actinic keratoses    Dr Glee Arvin  . Allergic rhinitis   . Aortic stenosis    s/p TAVR 06/2016  . Arthritis   . Benign essential HTN   . Bicuspid aortic valve   . Bilateral cataracts   . BPH (benign prostatic hypertrophy)   . Coronary artery disease involving native coronary artery of native heart without angina pectoris    PCI 06/06/16 LAD proximal 60 (FFR 0.85-negative); RI 50; LCx calcified without significant stenosis; RCA mid 80 (FFR 0.68-hemodynamically significant); PCI: 2.25 x 16 mm Synergy DES to the mid RCA  . Dilated aortic root ([place])    53mm by echo 08/2015  . Glaucoma   . H/O seasonal allergies   . Hx of adenomatous colonic polyps 1991 on  . Hx of cardiovascular stress test    Myoview (10/15):  normal  . Hyperlipidemia   . Mitral valve prolapse    mild MR by echo 08/2015  . Paresthesia    Chronic left lateral thigh  . Pericardial effusion 11/05/2016    Past Surgical History:  Procedure Laterality Date  . COLONOSCOPY  multiple  . CORONARY STENT INTERVENTION N/A 06/06/2016  Procedure: Coronary Stent Intervention;  Surgeon: [name], MD;  Location: [place] [facility];  Service: Cardiovascular;  Laterality: N/A;  . CYSTOSCOPY  08/12/2016   per patient; showed a cyst on bladder , liver , and kidney , sees urologist Matilde Bash III at [facility]  . EYE SURGERY     Bilateral cataract removal  . INTRAVASCULAR PRESSURE WIRE/FFR STUDY N/A 05/29/2016   Procedure: Intravascular Pressure Wire/FFR Study;  Surgeon: [name], MD;  Location: [place] [facility];  Service: Cardiovascular;  Laterality: N/A;  . RIGHT/LEFT HEART CATH AND CORONARY ANGIOGRAPHY N/A 05/29/2016   Procedure: Right/Left Heart Cath and Coronary Angiography;  Surgeon: [name], MD;  Location: [place] [facility];  Service: Cardiovascular;  Laterality: N/A;  . TEE WITHOUT CARDIOVERSION N/A 07/02/2016   Procedure: TRANSESOPHAGEAL ECHOCARDIOGRAM (TEE);  Surgeon: [name], MD;  Location: [place];  Service: Open Heart Surgery;  Laterality: N/A;  . TONSILLECTOMY  1949  . TOTAL HIP ARTHROPLASTY Left 10/16/2016   Procedure: LEFT TOTAL HIP ARTHROPLASTY ANTERIOR APPROACH;  Surgeon: Gaynelle Arabian, MD;  Location: [facility];  Service: Orthopedics;  Laterality: Left;  . TRANSCATHETER AORTIC VALVE REPLACEMENT, TRANSFEMORAL N/A 07/02/2016   Procedure: TRANSCATHETER AORTIC VALVE REPLACEMENT, TRANSFEMORAL;  Surgeon: [name], MD;  Location: [place];  Service: Open Heart Surgery;  Laterality: N/A;    Current Medications: Current Meds  Medication Sig  . acetaminophen (TYLENOL) 500 MG tablet Take 1 tablet (500 mg total) by mouth daily.  Marland Kitchen acetaminophen (TYLENOL) 500 MG tablet Take 2 tablets (1,000 mg total) by mouth at bedtime.  Marland Kitchen aspirin EC 81 MG tablet Take 81 mg daily by mouth.  . colchicine 0.6 MG tablet Take 1 tablet (0.6 mg total) by mouth 2 (two) times daily.  . famotidine (PEPCID) 20 MG tablet Take 20 mg by mouth 2 (two) times daily.  . feeding supplement (BOOST / RESOURCE BREEZE) LIQD Take 1 Container by mouth 2 (two) times daily between meals.  . feeding supplement, ENSURE ENLIVE, (ENSURE ENLIVE) LIQD Take 237 mLs by mouth daily.  . fluticasone (FLONASE) 50 MCG/ACT nasal spray Place 1 spray into both nostrils daily.  Marland Kitchen ibuprofen (MOTRIN IB) 200 MG tablet Take 3 tablets (600 mg total) by mouth 3 (three) times daily.  Marland Kitchen latanoprost (XALATAN) 0.005 % ophthalmic solution Place 1 drop into both eyes at bedtime.  . pravastatin (PRAVACHOL) 40 MG tablet Take 40 mg by mouth every evening.   . timolol (BETIMOL) 0.5 % ophthalmic solution Place 1 drop into both eyes 2 (two) times daily.     Allergies:   Patient has no known allergies.   Social History   Socioeconomic History  . Marital status:  Widowed    Spouse name: Not on file  . Number of children: Not on file  . Years of education: Not on file  . Highest education level: Not on file  Occupational History  . Not on file  Social Needs  . Financial resource strain: Not on file  . Food insecurity:    Worry: Not on file    Inability: Not on file  . Transportation needs:    Medical: Not on file    Non-medical: Not on file  Tobacco Use  . Smoking status: Former Smoker    Last attempt to quit: 07/14/1960    Years since quitting: 56.8  . Smokeless tobacco: Never Used  Substance and Sexual Activity  . Alcohol use: No    Frequency: Never  . Drug use: No  .  Sexual activity: Not on file  Lifestyle  . Physical activity:    Days per week: Not on file    Minutes per session: Not on file  . Stress: Not on file  Relationships  . Social connections:    Talks on phone: Not on file    Gets together: Not on file    Attends religious service: Not on file    Active member of club or organization: Not on file    Attends meetings of clubs or organizations: Not on file    Relationship status: Not on file  Other Topics Concern  . Not on file  Social History Narrative  . Not on file     Family History: The patient's family history includes Emphysema in his mother; Heart attack in his brother; Heart disease in his brother; Pancreatic cancer in his father. There is no history of Colon cancer or Stomach cancer.  ROS:   Please see the history of present illness.    ROS  All other systems reviewed and negative.   EKGs/Labs/Other Studies Reviewed:    The following studies were reviewed today: Hospital notes and 2D echo  EKG:  EKG is ordered today and showed NSR at 72bpm with LAFB and LVH  Recent Labs: 11/05/2016: ALT 9 04/23/2017: Magnesium 2.0 04/24/2017: BUN 29; Creatinine, Ser 0.75; Hemoglobin 11.9; Platelets 238; Potassium 4.5; Sodium 136   Recent Lipid Panel    Component Value Date/Time   CHOL 120 11/05/2016 0936    TRIG 81 11/05/2016 0936   HDL 39 (L) 11/05/2016 0936   CHOLHDL 3.1 11/05/2016 0936   LDLCALC 65 11/05/2016 0936    Physical Exam:    VS:  BP 130/74   Pulse 80   Ht 5\' 9"  (1.753 m)   Wt 150 lb (68 kg)   SpO2 97%   BMI 22.15 kg/m     Wt Readings from Last 3 Encounters:  05/16/17 150 lb (68 kg)  04/25/17 132 lb 6.4 oz (60.1 kg)  02/20/17 121 lb (54.9 kg)     GEN:  Well nourished, well developed in no acute distress HEENT: Normal NECK: No JVD; No carotid bruits LYMPHATICS: No lymphadenopathy CARDIAC: RRR, no murmurs, rubs, gallops RESPIRATORY: Decreased breath sounds at the bases with scattered wheezing ABDOMEN: Increased abdominal fullness but still soft MUSCULOSKELETAL:  No edema; No deformity  SKIN: Warm and dry NEUROLOGIC:  Alert and oriented x 3 PSYCHIATRIC:  Normal affect   ASSESSMENT:    1. Nonrheumatic aortic valve stenosis   2. Benign essential HTN   3. Coronary artery disease involving native coronary artery of native heart without angina pectoris   4. Pericardial effusion   5. New onset atrial fibrillation (HCC)   6. Pure hypercholesterolemia    PLAN:    In order of problems listed above:  1.  Severe aortic stenosis with bicuspid aortic valve - S/P TAVR.  He is now off Plavix and only on aspirin 81 mg a day as he is more than a year out from his TAVR.  2.  Hypertension -BP is well controlled on exam today.  He is not on any antihypertensive therapy at this time.  3.  CAD -  nonobstructive ASCAD in the LAD and ramus with hemodynamically significant RCA stenosis by FFR and underwent PCI of the RCA with DES by cath 05/2016.  He denies any anginal symptoms.  We will continue aspirin 81 mg daily and pravastatin 40 mg daily.  4.  Moderate pericardial effusion -been  treated with 2 weeks of ibuprofen high dose.  Effusion has been present for some time even before his TAVR.  His ANA, rheumatoid factor and sed rate were normal but CRP was elevated.  HIV was normal.   He remains on colchicine 0.6 mg twice daily.  Repeat 2D echocardiogram is pending next week.  I am concerned that he has had a 40 pound unintentional weight loss over the past few months and also has pulmonary nodules on chest CT.  He has been referred to Dr. Chase Caller with pulmonary and will await his recommendations.  Discussed with Dr. Burt Knack.  His sedimentation rate and CRP were elevated at 48.  He has multiple findings on his CT scan of his lungs including pulmonary nodules so need to consider malignancy is a possibility of his effusion.  He is now having symptoms of right heart failure with increased abdominal fullness, weight loss and lower extremity edema.  I am going to get a cardiac MRI early next week to get a better assessment of his pericardium and rule out  constrictive pericarditis.  He may need right and left heart catheterization.  Will likely need pericardiocentesis for fluid evaluation but given the posterior origin of his effusion will likely need a pericardial window.  I am going to start him on Lasix 20 mg daily to help with his edema and get a PA and lateral chest x-ray because of decreased breath sounds.  I will check a bmet, TSH and BNP today.  5.  New onset atrial fibrillation -he is back in normal sinus rhythm by EKG on exam today.    He is not on any rate slowing agents at this time.  It was recommended that he not be started on Eliquis until he completed his 2-week course of nonsteroidal anti-inflammatory therapy.  I do not want to start him on anticoagulation until we can document that effusion is stable.  He is having a cardiac MRI next week.  6.  Hyperlipidemia -he will continue on pravastatin 40 mg daily.  LDL was at goal at 65 on 11/05/2016.   Medication Adjustments/Labs and Tests Ordered: Current medicines are reviewed at length with the patient today.  Concerns regarding medicines are outlined above.  Orders Placed This Encounter  Procedures  . MR Card Morphology Wo/W  Cm  . Basic metabolic panel  . TSH  . Pro b natriuretic peptide (BNP)  . EKG 12-Lead   Meds ordered this encounter  Medications  . furosemide (LASIX) 20 MG tablet    Sig: Take 1 tablet (20 mg total) by mouth daily.    Dispense:  30 tablet    Refill:  3    Signed, Fransico Him, MD  05/16/2017 10:16 AM    [place]

## 2017-05-16 NOTE — H&P (View-Only) (Signed)
Cardiology Office Note:    Date:  05/16/2017   ID:  [name] [name], DOB [birth year], MRN [id number]  PCP:  [name] Dus, MD  Cardiologist:  No primary care provider on file.    Referring MD: [name] Dus, MD   Chief Complaint  Patient presents with  . Follow-up    pericardial effusion, PAD    History of Present Illness:    [name] [name] is a 82 y.o. male with a hx of dyslipidemia and family history of CAD, bicuspid aortic valve with moderate AS and mildly dilated aortic root and mild MVP. His AS progressed over the past year and was referred to Dr. Burt Knack for evaluation of severe AS.  LHC showed nonobstructive ASCAD in the LAD and ramus with hemodynamically significant RCA stenosis by FFR and underwent PCI of the RCA with DES.  He subsequently underwent TAVR 07/02/2016. He has a chronic pericardial effusion that was moderate on the day of the TAVR with no tamponade and had been present prior to the TAVR.  He was admitted last month with chest pain and syncope and was found to be in new onset atrial fibrillation.  He apparently had also complained of losing 40 pounds over the past year that was unintentional.  Pulmonary nodules are noted on chest CT back in February 2019.  It was felt that his chest pain is atypical and likely musculoskeletal as well as a pleuritic component.  He was started on ibuprofen 600 mg 3 times daily along with Pepcid for 10 days.  His Plavix was stopped.  It was recommended that before starting anticoagulation with Eliquis he be treated for 2 weeks with anti-inflammatories due to the pleuritic condition of his chest pain as well as his pericardial effusion.  Was also recommended to have a follow-up chest CT in 3 to 6 months for his multiple pulmonary nodules.  He is here today for followup.  He has been recently having some lower extremity edema as well as abdominal fullness and shortness of breath.  He is actually gained weight 18 pounds since last month.  He denies  any chest pain or pressure,  PND, orthopnea,dizziness, palpitations or syncope. He is compliant with his meds and is tolerating meds with no SE.      Past Medical History:  Diagnosis Date  . Actinic keratoses    Dr Glee Arvin  . Allergic rhinitis   . Aortic stenosis    s/p TAVR 06/2016  . Arthritis   . Benign essential HTN   . Bicuspid aortic valve   . Bilateral cataracts   . BPH (benign prostatic hypertrophy)   . Coronary artery disease involving native coronary artery of native heart without angina pectoris    PCI 06/06/16 LAD proximal 60 (FFR 0.85-negative); RI 50; LCx calcified without significant stenosis; RCA mid 80 (FFR 0.68-hemodynamically significant); PCI: 2.25 x 16 mm Synergy DES to the mid RCA  . Dilated aortic root ([place])    47mm by echo 08/2015  . Glaucoma   . H/O seasonal allergies   . Hx of adenomatous colonic polyps 1991 on  . Hx of cardiovascular stress test    Myoview (10/15):  normal  . Hyperlipidemia   . Mitral valve prolapse    mild MR by echo 08/2015  . Paresthesia    Chronic left lateral thigh  . Pericardial effusion 11/05/2016    Past Surgical History:  Procedure Laterality Date  . COLONOSCOPY  multiple  . CORONARY STENT INTERVENTION N/A 06/06/2016  Procedure: Coronary Stent Intervention;  Surgeon: [name], MD;  Location: [place] [facility];  Service: Cardiovascular;  Laterality: N/A;  . CYSTOSCOPY  08/12/2016   per patient; showed a cyst on bladder , liver , and kidney , sees urologist Matilde Bash III at [facility]  . EYE SURGERY     Bilateral cataract removal  . INTRAVASCULAR PRESSURE WIRE/FFR STUDY N/A 05/29/2016   Procedure: Intravascular Pressure Wire/FFR Study;  Surgeon: [name], MD;  Location: [place] [facility];  Service: Cardiovascular;  Laterality: N/A;  . RIGHT/LEFT HEART CATH AND CORONARY ANGIOGRAPHY N/A 05/29/2016   Procedure: Right/Left Heart Cath and Coronary Angiography;  Surgeon: [name], MD;  Location: [place] [facility];  Service: Cardiovascular;  Laterality: N/A;  . TEE WITHOUT CARDIOVERSION N/A 07/02/2016   Procedure: TRANSESOPHAGEAL ECHOCARDIOGRAM (TEE);  Surgeon: [name], MD;  Location: [place];  Service: Open Heart Surgery;  Laterality: N/A;  . TONSILLECTOMY  1949  . TOTAL HIP ARTHROPLASTY Left 10/16/2016   Procedure: LEFT TOTAL HIP ARTHROPLASTY ANTERIOR APPROACH;  Surgeon: Gaynelle Arabian, MD;  [facility];  Service: Orthopedics;  Laterality: Left;  . TRANSCATHETER AORTIC VALVE REPLACEMENT, TRANSFEMORAL N/A 07/02/2016   Procedure: TRANSCATHETER AORTIC VALVE REPLACEMENT, TRANSFEMORAL;  Surgeon: [name], MD;  Location: [place];  Service: Open Heart Surgery;  Laterality: N/A;    Current Medications: Current Meds  Medication Sig  . acetaminophen (TYLENOL) 500 MG tablet Take 1 tablet (500 mg total) by mouth daily.  Marland Kitchen acetaminophen (TYLENOL) 500 MG tablet Take 2 tablets (1,000 mg total) by mouth at bedtime.  Marland Kitchen aspirin EC 81 MG tablet Take 81 mg daily by mouth.  . colchicine 0.6 MG tablet Take 1 tablet (0.6 mg total) by mouth 2 (two) times daily.  . famotidine (PEPCID) 20 MG tablet Take 20 mg by mouth 2 (two) times daily.  . feeding supplement (BOOST / RESOURCE BREEZE) LIQD Take 1 Container by mouth 2 (two) times daily between meals.  . feeding supplement, ENSURE ENLIVE, (ENSURE ENLIVE) LIQD Take 237 mLs by mouth daily.  . fluticasone (FLONASE) 50 MCG/ACT nasal spray Place 1 spray into both nostrils daily.  Marland Kitchen ibuprofen (MOTRIN IB) 200 MG tablet Take 3 tablets (600 mg total) by mouth 3 (three) times daily.  Marland Kitchen latanoprost (XALATAN) 0.005 % ophthalmic solution Place 1 drop into both eyes at bedtime.  . pravastatin (PRAVACHOL) 40 MG tablet Take 40 mg by mouth every evening.   . timolol (BETIMOL) 0.5 % ophthalmic solution Place 1 drop into both eyes 2 (two) times daily.     Allergies:   Patient has no known allergies.   Social History   Socioeconomic History  . Marital status:  Widowed    Spouse name: Not on file  . Number of children: Not on file  . Years of education: Not on file  . Highest education level: Not on file  Occupational History  . Not on file  Social Needs  . Financial resource strain: Not on file  . Food insecurity:    Worry: Not on file    Inability: Not on file  . Transportation needs:    Medical: Not on file    Non-medical: Not on file  Tobacco Use  . Smoking status: Former Smoker    Last attempt to quit: 07/14/1960    Years since quitting: 56.8  . Smokeless tobacco: Never Used  Substance and Sexual Activity  . Alcohol use: No    Frequency: Never  . Drug use: No  .  Sexual activity: Not on file  Lifestyle  . Physical activity:    Days per week: Not on file    Minutes per session: Not on file  . Stress: Not on file  Relationships  . Social connections:    Talks on phone: Not on file    Gets together: Not on file    Attends religious service: Not on file    Active member of club or organization: Not on file    Attends meetings of clubs or organizations: Not on file    Relationship status: Not on file  Other Topics Concern  . Not on file  Social History Narrative  . Not on file     Family History: The patient's family history includes Emphysema in his mother; Heart attack in his brother; Heart disease in his brother; Pancreatic cancer in his father. There is no history of Colon cancer or Stomach cancer.  ROS:   Please see the history of present illness.    ROS  All other systems reviewed and negative.   EKGs/Labs/Other Studies Reviewed:    The following studies were reviewed today: Hospital notes and 2D echo  EKG:  EKG is ordered today and showed NSR at 72bpm with LAFB and LVH  Recent Labs: 11/05/2016: ALT 9 04/23/2017: Magnesium 2.0 04/24/2017: BUN 29; Creatinine, Ser 0.75; Hemoglobin 11.9; Platelets 238; Potassium 4.5; Sodium 136   Recent Lipid Panel    Component Value Date/Time   CHOL 120 11/05/2016 0936    TRIG 81 11/05/2016 0936   HDL 39 (L) 11/05/2016 0936   CHOLHDL 3.1 11/05/2016 0936   LDLCALC 65 11/05/2016 0936    Physical Exam:    VS:  BP 130/74   Pulse 80   Ht 5\' 9"  (1.753 m)   Wt 150 lb (68 kg)   SpO2 97%   BMI 22.15 kg/m     Wt Readings from Last 3 Encounters:  05/16/17 150 lb (68 kg)  04/25/17 132 lb 6.4 oz (60.1 kg)  02/20/17 121 lb (54.9 kg)     GEN:  Well nourished, well developed in no acute distress HEENT: Normal NECK: No JVD; No carotid bruits LYMPHATICS: No lymphadenopathy CARDIAC: RRR, no murmurs, rubs, gallops RESPIRATORY: Decreased breath sounds at the bases with scattered wheezing ABDOMEN: Increased abdominal fullness but still soft MUSCULOSKELETAL:  No edema; No deformity  SKIN: Warm and dry NEUROLOGIC:  Alert and oriented x 3 PSYCHIATRIC:  Normal affect   ASSESSMENT:    1. Nonrheumatic aortic valve stenosis   2. Benign essential HTN   3. Coronary artery disease involving native coronary artery of native heart without angina pectoris   4. Pericardial effusion   5. New onset atrial fibrillation (HCC)   6. Pure hypercholesterolemia    PLAN:    In order of problems listed above:  1.  Severe aortic stenosis with bicuspid aortic valve - S/P TAVR.  He is now off Plavix and only on aspirin 81 mg a day as he is more than a year out from his TAVR.  2.  Hypertension -BP is well controlled on exam today.  He is not on any antihypertensive therapy at this time.  3.  CAD -  nonobstructive ASCAD in the LAD and ramus with hemodynamically significant RCA stenosis by FFR and underwent PCI of the RCA with DES by cath 05/2016.  He denies any anginal symptoms.  We will continue aspirin 81 mg daily and pravastatin 40 mg daily.  4.  Moderate pericardial effusion -been  treated with 2 weeks of ibuprofen high dose.  Effusion has been present for some time even before his TAVR.  His ANA, rheumatoid factor and sed rate were normal but CRP was elevated.  HIV was normal.   He remains on colchicine 0.6 mg twice daily.  Repeat 2D echocardiogram is pending next week.  I am concerned that he has had a 40 pound unintentional weight loss over the past few months and also has pulmonary nodules on chest CT.  He has been referred to Dr. Chase Caller with pulmonary and will await his recommendations.  Discussed with Dr. Burt Knack.  His sedimentation rate and CRP were elevated at 48.  He has multiple findings on his CT scan of his lungs including pulmonary nodules so need to consider malignancy is a possibility of his effusion.  He is now having symptoms of right heart failure with increased abdominal fullness, weight loss and lower extremity edema.  I am going to get a cardiac MRI early next week to get a better assessment of his pericardium and rule out  constrictive pericarditis.  He may need right and left heart catheterization.  Will likely need pericardiocentesis for fluid evaluation but given the posterior origin of his effusion will likely need a pericardial window.  I am going to start him on Lasix 20 mg daily to help with his edema and get a PA and lateral chest x-ray because of decreased breath sounds.  I will check a bmet, TSH and BNP today.  5.  New onset atrial fibrillation -he is back in normal sinus rhythm by EKG on exam today.    He is not on any rate slowing agents at this time.  It was recommended that he not be started on Eliquis until he completed his 2-week course of nonsteroidal anti-inflammatory therapy.  I do not want to start him on anticoagulation until we can document that effusion is stable.  He is having a cardiac MRI next week.  6.  Hyperlipidemia -he will continue on pravastatin 40 mg daily.  LDL was at goal at 65 on 11/05/2016.   Medication Adjustments/Labs and Tests Ordered: Current medicines are reviewed at length with the patient today.  Concerns regarding medicines are outlined above.  Orders Placed This Encounter  Procedures  . MR Card Morphology Wo/W  Cm  . Basic metabolic panel  . TSH  . Pro b natriuretic peptide (BNP)  . EKG 12-Lead   Meds ordered this encounter  Medications  . furosemide (LASIX) 20 MG tablet    Sig: Take 1 tablet (20 mg total) by mouth daily.    Dispense:  30 tablet    Refill:  3    Signed, Fransico Him, MD  05/16/2017 10:16 AM    [place]

## 2017-05-16 NOTE — Patient Instructions (Addendum)
Medication Instructions:  Your physician has recommended you make the following change in your medication:  START: Lasix 20 mg (1 tablet) in the morning   If you need a refill on your cardiac medications, please contact your pharmacy first.  Labwork: Today for kidney function test, thyroid, and BNP  Testing/Procedures: Your physician has requested that you have a cardiac MRI on Monday. Cardiac MRI uses a computer to create images of your heart as its beating, producing both still and moving pictures of your heart and major blood vessels. For further information please visit [email or web address] Please follow the instruction sheet given to you today for more information.  A chest x-ray takes a picture of the organs and structures inside the chest, including the heart, lungs, and blood vessels. This test can show several things, including, whether the heart is enlarges; whether fluid is building up in the lungs; and whether pacemaker / defibrillator leads are still in place.  Follow-Up: Keep you follow up appointment with Melina Copa, PA on 05/28/17 at 11:30AM  Patient scheduled with Dr. Chase Caller, pulmonologist on 06/17/17 at 9AM   Any Other Special Instructions Will Be Listed Below (If Applicable).   Thank you for choosing [facility], RN  [phone]  If you need a refill on your cardiac medications before your next appointment, please call your pharmacy.

## 2017-05-16 NOTE — Telephone Encounter (Signed)
Pt's pharmacy is requesting a refill on famotidine. Would Dr. Turner like to refill this medication? Please address 

## 2017-05-16 NOTE — Progress Notes (Signed)
Per Dr. Radford Pax patient should continue taking pepcid 20 mg once a day while taking NSAIDS. Patient made aware and verbalized understanding.

## 2017-05-17 LAB — BASIC METABOLIC PANEL
BUN / CREAT RATIO: 23 (ref 10–24)
BUN: 16 mg/dL (ref 8–27)
CO2: 27 mmol/L (ref 20–29)
Calcium: 8.9 mg/dL (ref 8.6–10.2)
Chloride: 101 mmol/L (ref 96–106)
Creatinine, Ser: 0.71 mg/dL — ABNORMAL LOW (ref 0.76–1.27)
GFR calc Af Amer: 103 mL/min/{1.73_m2} (ref >59)
GFR calc non Af Amer: 89 mL/min/{1.73_m2} (ref >59)
GLUCOSE: 153 mg/dL — AB (ref 65–99)
Potassium: 4.7 mmol/L (ref 3.5–5.2)
SODIUM: 141 mmol/L (ref 134–144)

## 2017-05-17 LAB — TSH: TSH: 2.74 u[IU]/mL (ref 0.450–4.500)

## 2017-05-17 LAB — PRO B NATRIURETIC PEPTIDE: NT-Pro BNP: 4363 pg/mL — ABNORMAL HIGH (ref 0–486)

## 2017-05-19 ENCOUNTER — Telehealth: Payer: Self-pay | Admitting: Cardiology

## 2017-05-19 NOTE — Telephone Encounter (Signed)
Spoke with pt who needed clarification regarding his Cardiac MRI and ECHO. Both need to be scheduled. High priority message sent to scheduling. Pt also asked about beginning Eliquis. I advised him to hold taking it until his f/up tests have been evaluated by Dr Radford Pax. Pt verbalized understanding and will await scheduling for his ECHO and MRI.

## 2017-05-19 NOTE — Telephone Encounter (Signed)
New Message:      Pt returning a call to discuss Eliquis.

## 2017-05-20 ENCOUNTER — Ambulatory Visit (HOSPITAL_COMMUNITY): Admission: RE | Admit: 2017-05-20 | Payer: BC Managed Care – PPO | Source: Ambulatory Visit

## 2017-05-20 ENCOUNTER — Ambulatory Visit (HOSPITAL_COMMUNITY): Payer: BC Managed Care – PPO

## 2017-05-20 ENCOUNTER — Telehealth: Payer: Self-pay

## 2017-05-20 MED ORDER — FUROSEMIDE 20 MG PO TABS: 40.0000 mg | ORAL_TABLET | Freq: Two times a day (BID) | ORAL | 3 refills | Status: DC

## 2017-05-20 NOTE — Telephone Encounter (Signed)
Follow Up:; ° ° °Returning your call. °

## 2017-05-20 NOTE — Telephone Encounter (Signed)
Notes recorded by Teressa Senter, RN on 05/20/2017 at 3:33 PM EDT Patient aware of lab results. Instructed patient to increase lasix to 40 mg(2 tablets) two times a day. Patient verbalized understanding and thankful for the call  Patient scheduled with Melina Copa, PA on 05/28/17. Will confirm with Dr. Radford Pax if she wants patient moved up.   Notes recorded by Sueanne Margarita, MD on 05/19/2017 at 6:10 PM EDT BMET and TSH are normal but pro - BNP elevated at 4,363. Started on Lasix 20mg  daily at OV - please increase to 40mg  PO BID and have him seen by extender on Wednesday. ------

## 2017-05-21 ENCOUNTER — Encounter: Payer: Self-pay | Admitting: Physician Assistant

## 2017-05-21 NOTE — Telephone Encounter (Signed)
See phone note from 05/20/17.

## 2017-05-21 NOTE — Progress Notes (Deleted)
Cardiology Office Note    Date:  05/21/2017  ID:  ABB GOBERT, DOB [birth year], MRN [id number] PCP:  [name] Dus, MD  Cardiologist:  Dr. Radford Pax   Chief Complaint: f/u CHF  History of Present Illness:  [name] [name] is a 82 y.o. male with history of dyslipidemia, CAD s/p DES to RCA 05/2016 (residual nonobstructive disease in LAD, ramus) 05/2016, bicuspid aortic valve/severe AS s/p TAVR 06/2016, PAF, pericardial effusion, unintentional weight loss, pulmonary nodules, BPH, recent right heart failure who presents for follow-up.  He subsequently underwent TAVR 07/02/2016 (with PCI one month prior as above). He has a chronic pericardial effusion that was moderate on the day of the TAVR with no tamponade and had been present prior to the TAVR. He was admitted 04/2017 with chest pain and syncope and was found to be in new onset atrial fibrillation. He apparently had also complained of losing 40 pounds over the past year that was unintentional.  Pulmonary nodules were noted on chest CT back in February 2019.  It was felt that his chest pain is atypical and likely musculoskeletal as well as a pleuritic component. He was started on ibuprofen 600 mg 3 times daily along with Pepcid for 10 days. His Plavix was stopped. It was recommended that before starting anticoagulation with Eliquis he be treated for 2 weeks with anti-inflammatories due to the pleuritic condition of his chest pain as well as his pericardial effusion. It was also recommended to have a follow-up chest CT in 3 to 6 months for his multiple pulmonary nodules. Echo 05/06/17 showed mild LVH, mild MR, normal wall motion of LV, reduced RV function, mild TR, moderate pericardial effusion not c/w tamponade. Labs showed elevated CRP 48.6, ESR 58, ANA and RF normal. At f/u 5/3 he had 18lb weight gain, LEE, abdominal fullness and SOB. BMET showed Cr 0.71, K 4.7, normal TSH. Prior HIV nonreactive. Dr. Radford Pax recommended Lasix 46m daily initially then 423m BID with follow-up. She ordered a cMRI and recommended no anticoagulation for PAF until we can document that effusion is stable. It was felt that he might need a R/LHC and/or pericardiocentesis vs window.  repeat imaging     Right heart failure Pericardial effusion PAF CAD    Past Medical History:  Diagnosis Date  . Actinic keratoses    Dr HaGlee Arvin. Allergic rhinitis   . Aortic stenosis    s/p TAVR 06/2016  . Arthritis   . Benign essential HTN   . Bicuspid aortic valve   . Bilateral cataracts   . BPH (benign prostatic hypertrophy)   . Coronary artery disease involving native coronary artery of native heart without angina pectoris    PCI 06/06/16 LAD proximal 60 (FFR 0.85-negative); RI 50; LCx calcified without significant stenosis; RCA mid 80 (FFR 0.68-hemodynamically significant); PCI: 2.25 x 16 mm Synergy DES to the mid RCA  . [place] ([place]   4349my echo 08/2015  . Glaucoma   . H/O seasonal allergies   . Hx of adenomatous colonic polyps 1991 on  . Hx of cardiovascular stress test    Myoview (10/15):  normal  . Hyperlipidemia   . Mitral valve prolapse    mild MR by echo 08/2015  . [place] ([place]) [place] . Paresthesia    Chronic left lateral thigh  . Pericardial effusion 11/05/2016  . Pulmonary nodules   . [place] heart [place] . S/P TAVR (transcatheter aortic valve  replacement)     Past Surgical History:  Procedure Laterality Date  . COLONOSCOPY  multiple  . CORONARY STENT INTERVENTION N/A 06/06/2016   Procedure: Coronary Stent Intervention;  Surgeon: [name] Mocha, MD;  Location: [place] [facility];  Service: Cardiovascular;  Laterality: N/A;  . CYSTOSCOPY  08/12/2016   per patient; showed a cyst on bladder , liver , and kidney , sees [facility]rologist Matilde Bash [facility] at [facility]  . EYE SURGERY     Bilateral cataract removal  . INTRAVASCULAR PRESSURE WIRE/FFR STUDY N/A 05/29/2016   Procedure: Intravascular Pressure  Wire/FFR Study;  Surgeon: [name] Mocha, MD;  Location: [place] [facility];  Service: Cardiovascular;  Laterality: N/A;  . RIGHT/LEFT HEART CATH AND CORONARY ANGIOGRAPHY N/A 05/29/2016   Procedure: Right/Left Heart Cath and Coronary Angiography;  Surgeon: [name] Mocha, MD;  Location: [place] [facility];  Service: Cardiovascular;  Laterality: N/A;  . TEE WITHOUT CARDIOVERSION N/A 07/02/2016   Procedure: TRANSESOPHAGEAL ECHOCARDIOGRAM (TEE);  Surgeon: [name] Mocha, MD;  Location: [place];  Service: Open Heart Surgery;  Laterality: N/A;  . TONSILLECTOMY  1949  . TOTAL HIP ARTHROPLASTY Left 10/16/2016   Procedure: LEFT TOTAL HIP ARTHROPLASTY ANTERIOR APPROACH;  Surgeon: [name] Arabian, MD;  [facility];  Service: Orthopedics;  Laterality: Left;  . TRANSCATHETER AORTIC VALVE REPLACEMENT, TRANSFEMORAL N/A 07/02/2016   Procedure: TRANSCATHETER AORTIC VALVE REPLACEMENT, TRANSFEMORAL;  Surgeon: [name] Mocha, MD;  Location: [place];  Service: Open Heart Surgery;  Laterality: N/A;    Current Medications: No outpatient medications have been marked as taking for the 05/22/17 encounter (Appointment) with [name] Pitter, PA-C.   ***   Allergies:   Patient has no known allergies.   Social History   Socioeconomic History  . Marital status: Widowed    Spouse name: Not on file  . Number of children: Not on file  . Years of education: Not on file  . Highest education level: Not on file  Occupational History  . Not on file  Social Needs  . Financial resource strain: Not on file  . Food insecurity:    Worry: Not on file    Inability: Not on file  . Transportation needs:    Medical: Not on file    Non-medical: Not on file  Tobacco Use  . Smoking status: Former Smoker    Last attempt to quit: 07/14/1960    Years since quitting: 56.8  . Smokeless tobacco: Never Used  Substance and Sexual Activity  . Alcohol use: No    Frequency: Never  . Drug use: No  . Sexual activity: Not on file    Lifestyle  . Physical activity:    Days per week: Not on file    Minutes per session: Not on file  . Stress: Not on file  Relationships  . Social connections:    Talks on phone: Not on file    Gets together: Not on file    Attends religious service: Not on file    Active member of club or organization: Not on file    Attends meetings of clubs or organizations: Not on file    Relationship status: Not on file  Other Topics Concern  . Not on file  Social History Narrative  . Not on file     Family History:  Family History  Problem Relation Age of Onset  . Emphysema Mother   . Pancreatic cancer Father   . Heart attack Brother   . Heart disease Brother   .  Colon cancer Neg Hx   . Stomach cancer Neg Hx    ***  ROS:   Please see the history of present illness. Otherwise, review of systems is positive for ***.  All other systems are reviewed and otherwise negative.    PHYSICAL EXAM:   VS:  There were no vitals taken for this visit.  BMI: There is no height or weight on file to calculate BMI. GEN: Well nourished, well developed, in no acute distress  HEENT: normocephalic, atraumatic Neck: no JVD, carotid bruits, or masses Cardiac: ***RRR; no murmurs, rubs, or gallops, no edema  Respiratory:  clear to auscultation bilaterally, normal work of breathing GI: soft, nontender, nondistended, + BS MS: no deformity or atrophy  Skin: warm and dry, no rash Neuro:  Alert and Oriented x 3, Strength and sensation are intact, follows commands Psych: euthymic mood, full affect  Wt Readings from Last 3 Encounters:  05/16/17 150 lb (68 kg)  04/25/17 132 lb 6.4 oz (60.1 kg)  02/20/17 121 lb (54.9 kg)      Studies/Labs Reviewed:   EKG:  EKG was ordered today and personally reviewed by me and demonstrates *** EKG was not ordered today.***  Recent Labs: 11/05/2016: ALT 9 04/23/2017: Magnesium 2.0 04/24/2017: Hemoglobin 11.9; Platelets 238 05/16/2017: BUN 16; Creatinine, Ser 0.71;  NT-Pro BNP 4,363; Potassium 4.7; Sodium 141; TSH 2.740   Lipid Panel    Component Value Date/Time   CHOL 120 11/05/2016 0936   TRIG 81 11/05/2016 0936   HDL 39 (L) 11/05/2016 0936   CHOLHDL 3.1 11/05/2016 0936   LDLCALC 65 11/05/2016 0936    Additional studies/ records that were reviewed today include: Summarized above.***    ASSESSMENT & PLAN:   1. ***  Disposition: F/u with ***   Medication Adjustments/Labs and Tests Ordered: Current medicines are reviewed at length with the patient today.  Concerns regarding medicines are outlined above. Medication changes, Labs and Tests ordered today are summarized above and listed in the Patient Instructions accessible in Encounters.   Signed, [name] Pitter, PA-C  05/21/2017 10:38 AM    [facility] [place], [place], [place]  [address] Phone: [phone]; Fax: [phone]

## 2017-05-22 ENCOUNTER — Ambulatory Visit: Payer: BC Managed Care – PPO | Admitting: Physician Assistant

## 2017-05-22 ENCOUNTER — Ambulatory Visit (HOSPITAL_COMMUNITY)
Admission: RE | Admit: 2017-05-22 | Discharge: 2017-05-22 | Disposition: A | Discharge status: Home / Self Care | Payer: BC Managed Care – PPO | Source: Ambulatory Visit | Attending: Cardiology | Admitting: Cardiology

## 2017-05-22 ENCOUNTER — Ambulatory Visit
Admission: RE | Admit: 2017-05-22 | Discharge: 2017-05-22 | Disposition: A | Discharge status: Home / Self Care | Payer: BC Managed Care – PPO | Source: Ambulatory Visit | Attending: Family Medicine | Admitting: Family Medicine

## 2017-05-22 DIAGNOSIS — I313 Pericardial effusion (noninflammatory): Secondary | ICD-10-CM | POA: Diagnosis not present

## 2017-05-22 DIAGNOSIS — I517 Cardiomegaly: Secondary | ICD-10-CM | POA: Diagnosis not present

## 2017-05-22 DIAGNOSIS — R918 Other nonspecific abnormal finding of lung field: Secondary | ICD-10-CM

## 2017-05-22 DIAGNOSIS — R0989 Other specified symptoms and signs involving the circulatory and respiratory systems: Secondary | ICD-10-CM

## 2017-05-22 DIAGNOSIS — J90 Pleural effusion, not elsewhere classified: Secondary | ICD-10-CM | POA: Diagnosis not present

## 2017-05-22 DIAGNOSIS — I3139 Other pericardial effusion (noninflammatory): Secondary | ICD-10-CM

## 2017-05-22 MED ORDER — GADOBENATE DIMEGLUMINE 529 MG/ML IV SOLN
40.0000 mL | Freq: Once | INTRAVENOUS | Status: AC
  Administered 2017-05-22: 28 mL via INTRAVENOUS

## 2017-05-23 ENCOUNTER — Telehealth: Payer: Self-pay

## 2017-05-23 ENCOUNTER — Other Ambulatory Visit: Payer: Self-pay

## 2017-05-23 ENCOUNTER — Encounter: Payer: Self-pay | Admitting: Cardiology

## 2017-05-23 ENCOUNTER — Ambulatory Visit (HOSPITAL_COMMUNITY): Payer: BC Managed Care – PPO | Attending: Cardiology

## 2017-05-23 DIAGNOSIS — I119 Hypertensive heart disease without heart failure: Secondary | ICD-10-CM | POA: Insufficient documentation

## 2017-05-23 DIAGNOSIS — I4891 Unspecified atrial fibrillation: Secondary | ICD-10-CM | POA: Insufficient documentation

## 2017-05-23 DIAGNOSIS — I341 Nonrheumatic mitral (valve) prolapse: Secondary | ICD-10-CM | POA: Diagnosis not present

## 2017-05-23 DIAGNOSIS — I2510 Atherosclerotic heart disease of native coronary artery without angina pectoris: Secondary | ICD-10-CM | POA: Insufficient documentation

## 2017-05-23 DIAGNOSIS — Z8249 Family history of ischemic heart disease and other diseases of the circulatory system: Secondary | ICD-10-CM | POA: Insufficient documentation

## 2017-05-23 DIAGNOSIS — I3139 Other pericardial effusion (noninflammatory): Secondary | ICD-10-CM

## 2017-05-23 DIAGNOSIS — I272 Pulmonary hypertension, unspecified: Secondary | ICD-10-CM | POA: Insufficient documentation

## 2017-05-23 DIAGNOSIS — Z87891 Personal history of nicotine dependence: Secondary | ICD-10-CM | POA: Diagnosis not present

## 2017-05-23 DIAGNOSIS — J90 Pleural effusion, not elsewhere classified: Secondary | ICD-10-CM | POA: Insufficient documentation

## 2017-05-23 DIAGNOSIS — Z953 Presence of xenogenic heart valve: Secondary | ICD-10-CM | POA: Insufficient documentation

## 2017-05-23 DIAGNOSIS — I313 Pericardial effusion (noninflammatory): Secondary | ICD-10-CM | POA: Diagnosis not present

## 2017-05-23 DIAGNOSIS — E785 Hyperlipidemia, unspecified: Secondary | ICD-10-CM | POA: Diagnosis not present

## 2017-05-23 NOTE — Telephone Encounter (Signed)
Left a message to call back in regards to cancelled OV with Melina Copa, PA on 05/22/17.

## 2017-05-24 NOTE — Telephone Encounter (Signed)
This encounter was created in error - please disregard.

## 2017-05-26 ENCOUNTER — Telehealth: Payer: Self-pay

## 2017-05-26 DIAGNOSIS — J90 Pleural effusion, not elsewhere classified: Secondary | ICD-10-CM

## 2017-05-26 DIAGNOSIS — I313 Pericardial effusion (noninflammatory): Secondary | ICD-10-CM

## 2017-05-26 DIAGNOSIS — I3139 Other pericardial effusion (noninflammatory): Secondary | ICD-10-CM

## 2017-05-26 NOTE — Telephone Encounter (Signed)
Notes recorded by Teressa Senter, RN on 05/26/2017 at 2:01 PM EDT Patient made aware of echo results and Dr.Turner's recommendation for right and left heart cath to assess constructive pericarditis. Patient in agreement with plan and thankful for the call. Patient aware he will receive a call to schedule appt.= Referral sent for Dr. 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 Aundra Dubin for right and left heart cath   Patient informed of Dr. Theodosia Blender recommendation for thoracentesis for pleural effusion. Patient set up with IR for procedure on 05/28/17 at [place]. Patient made aware to be at [facility] at 8:45AM 1st floor radiology. Patient verbalized understanding and thankful for the call   Notes recorded by Sueanne Margarita, MD on 05/24/2017 at 4:17 PM EDT Please let patient know that MRI showed moderate pericardial effusion mainly posterior and I am concerned about constrictive pericarditis. He also has a large pleural effusion that may be related to same thing causing pericardial effusion. Please set him up for thoracentesis by interventional radiology of which ever pleural effusion is bigger (right or left) and send for cytology, gram stain and culture, LDH, glucose and protein. I would like him referred to Dr. Aundra Dubin for right and left heart cath to rule out constrictive pericarditis

## 2017-05-28 ENCOUNTER — Ambulatory Visit: Payer: BC Managed Care – PPO | Admitting: Physician Assistant

## 2017-05-28 ENCOUNTER — Telehealth: Payer: Self-pay | Admitting: Cardiology

## 2017-05-28 ENCOUNTER — Encounter (HOSPITAL_COMMUNITY): Payer: Self-pay | Admitting: Radiology

## 2017-05-28 ENCOUNTER — Ambulatory Visit (HOSPITAL_COMMUNITY)
Admission: RE | Admit: 2017-05-28 | Discharge: 2017-05-28 | Disposition: A | Discharge status: Home / Self Care | Payer: BC Managed Care – PPO | Source: Ambulatory Visit | Attending: Cardiology | Admitting: Cardiology

## 2017-05-28 ENCOUNTER — Ambulatory Visit (HOSPITAL_COMMUNITY)
Admission: RE | Admit: 2017-05-28 | Discharge: 2017-05-28 | Disposition: A | Discharge status: Home / Self Care | Payer: BC Managed Care – PPO | Source: Ambulatory Visit | Attending: Radiology | Admitting: Radiology

## 2017-05-28 DIAGNOSIS — J90 Pleural effusion, not elsewhere classified: Secondary | ICD-10-CM

## 2017-05-28 DIAGNOSIS — Z952 Presence of prosthetic heart valve: Secondary | ICD-10-CM | POA: Insufficient documentation

## 2017-05-28 DIAGNOSIS — I2510 Atherosclerotic heart disease of native coronary artery without angina pectoris: Secondary | ICD-10-CM | POA: Diagnosis not present

## 2017-05-28 DIAGNOSIS — I313 Pericardial effusion (noninflammatory): Secondary | ICD-10-CM | POA: Insufficient documentation

## 2017-05-28 DIAGNOSIS — Z9889 Other specified postprocedural states: Secondary | ICD-10-CM

## 2017-05-28 HISTORY — PX: IR THORACENTESIS ASP PLEURAL SPACE W/IMG GUIDE: IMG5380

## 2017-05-28 LAB — LACTATE DEHYDROGENASE, PLEURAL OR PERITONEAL FLUID: LD FL: 76 U/L — AB (ref 3–23)

## 2017-05-28 LAB — PROTEIN, PLEURAL OR PERITONEAL FLUID: Total protein, fluid: 3.6 g/dL

## 2017-05-28 LAB — GRAM STAIN

## 2017-05-28 LAB — GLUCOSE, PLEURAL OR PERITONEAL FLUID: Glucose, Fluid: 116 mg/dL

## 2017-05-28 MED ORDER — LIDOCAINE HCL (PF) 2 % IJ SOLN
INTRAMUSCULAR | Status: AC
  Filled 2017-05-28: qty 20

## 2017-05-28 MED ORDER — LIDOCAINE HCL (PF) 2 % IJ SOLN
INTRAMUSCULAR | Status: DC | PRN
  Administered 2017-05-28: 10 mL

## 2017-05-28 NOTE — Procedures (Signed)
Ultrasound-guided diagnostic and therapeutic left thoracentesis performed yielding 1 liter of yellow fluid. No immediate complications. Follow-up chest x-ray pending.The fluid was sent to the lab for preordered studies.

## 2017-05-28 NOTE — Telephone Encounter (Signed)
New message    Abby from [place] requesting to speak with Dr Radford Pax Please call [phone]

## 2017-05-30 ENCOUNTER — Telehealth: Payer: Self-pay | Admitting: *Deleted

## 2017-05-30 NOTE — Telephone Encounter (Signed)
I s/w pt and advised PA will be out of the office on 07/25/17, could we rsc appt. Pt is agreeable to [facility] and has now been scheduled to Richardson Dopp, [place] 07/29/17 @ 10:45. Pt thanked me for the call.

## 2017-06-02 LAB — CULTURE, BODY FLUID W GRAM STAIN -BOTTLE: Culture: NO GROWTH

## 2017-06-02 LAB — CULTURE, BODY FLUID-BOTTLE

## 2017-06-03 ENCOUNTER — Telehealth: Payer: Self-pay

## 2017-06-03 DIAGNOSIS — Z01810 Encounter for preprocedural cardiovascular examination: Secondary | ICD-10-CM

## 2017-06-03 NOTE — Telephone Encounter (Signed)
Left message for patient to schedule lab appt for pre-cath labs and to inform patient of right and left heart cath per Dr. Radford Pax due to pericardial effusion. Patient is scheduled for cath on 06/06/17 at [facility] with Dr. Aundra Dubin.

## 2017-06-04 ENCOUNTER — Other Ambulatory Visit: Payer: BC Managed Care – PPO | Admitting: *Deleted

## 2017-06-04 DIAGNOSIS — Z01810 Encounter for preprocedural cardiovascular examination: Secondary | ICD-10-CM

## 2017-06-04 LAB — BASIC METABOLIC PANEL
BUN/Creatinine Ratio: 31 — ABNORMAL HIGH (ref 10–24)
BUN: 25 mg/dL (ref 8–27)
CO2: 29 mmol/L (ref 20–29)
CREATININE: 0.81 mg/dL (ref 0.76–1.27)
Calcium: 9 mg/dL (ref 8.6–10.2)
Chloride: 99 mmol/L (ref 96–106)
GFR, EST AFRICAN AMERICAN: 98 mL/min/{1.73_m2} (ref >59)
GFR, EST NON AFRICAN AMERICAN: 85 mL/min/{1.73_m2} (ref >59)
Glucose: 113 mg/dL — ABNORMAL HIGH (ref 65–99)
POTASSIUM: 4.5 mmol/L (ref 3.5–5.2)
SODIUM: 142 mmol/L (ref 134–144)

## 2017-06-04 LAB — PROTIME-INR
INR: 1.2 (ref 0.8–1.2)
Prothrombin Time: 11.9 s (ref 9.1–12.0)

## 2017-06-04 LAB — CBC WITH DIFFERENTIAL/PLATELET
BASOS: 1 %
Basophils Absolute: 0 10*3/uL (ref 0.0–0.2)
EOS (ABSOLUTE): 0.2 10*3/uL (ref 0.0–0.4)
EOS: 3 %
HEMATOCRIT: 41.1 % (ref 37.5–51.0)
Hemoglobin: 13.3 g/dL (ref 13.0–17.7)
Immature Grans (Abs): 0.1 10*3/uL (ref 0.0–0.1)
Immature Granulocytes: 1 %
Lymphocytes Absolute: 0.9 10*3/uL (ref 0.7–3.1)
Lymphs: 14 %
MCH: 28.3 pg (ref 26.6–33.0)
MCHC: 32.4 g/dL (ref 31.5–35.7)
MCV: 87 fL (ref 79–97)
MONOS ABS: 0.7 10*3/uL (ref 0.1–0.9)
Monocytes: 11 %
NEUTROS ABS: 4.5 10*3/uL (ref 1.4–7.0)
Neutrophils: 70 %
PLATELETS: 313 10*3/uL (ref 150–450)
RBC: 4.7 x10E6/uL (ref 4.14–5.80)
RDW: 14.2 % (ref 12.3–15.4)
WBC: 6.4 10*3/uL (ref 3.4–10.8)

## 2017-06-04 NOTE — Telephone Encounter (Signed)
Patient is notified of scheduled cardiac cath for Friday 06/06/17 with Dr. Aundra Dubin. Instructions reviewed with patient and informed patient that instructions will be available for pick up when he comes in for pre cath labs today. Patient in agreement with plan and thankful for call.

## 2017-06-06 ENCOUNTER — Ambulatory Visit (HOSPITAL_COMMUNITY)
Admission: RE | Admit: 2017-06-06 | Discharge: 2017-06-06 | Disposition: A | Discharge status: Home / Self Care | Payer: BC Managed Care – PPO | Source: Ambulatory Visit | Attending: Cardiology | Admitting: Cardiology

## 2017-06-06 ENCOUNTER — Ambulatory Visit (HOSPITAL_COMMUNITY)
Admission: RE | Disposition: A | Discharge status: Home / Self Care | Payer: Self-pay | Source: Ambulatory Visit | Attending: Cardiology

## 2017-06-06 DIAGNOSIS — Z7982 Long term (current) use of aspirin: Secondary | ICD-10-CM | POA: Insufficient documentation

## 2017-06-06 DIAGNOSIS — I2721 Secondary pulmonary arterial hypertension: Secondary | ICD-10-CM | POA: Diagnosis not present

## 2017-06-06 DIAGNOSIS — N400 Enlarged prostate without lower urinary tract symptoms: Secondary | ICD-10-CM | POA: Insufficient documentation

## 2017-06-06 DIAGNOSIS — I2510 Atherosclerotic heart disease of native coronary artery without angina pectoris: Secondary | ICD-10-CM | POA: Insufficient documentation

## 2017-06-06 DIAGNOSIS — Z87891 Personal history of nicotine dependence: Secondary | ICD-10-CM | POA: Diagnosis not present

## 2017-06-06 DIAGNOSIS — Z952 Presence of prosthetic heart valve: Secondary | ICD-10-CM | POA: Insufficient documentation

## 2017-06-06 DIAGNOSIS — R918 Other nonspecific abnormal finding of lung field: Secondary | ICD-10-CM | POA: Diagnosis not present

## 2017-06-06 DIAGNOSIS — I341 Nonrheumatic mitral (valve) prolapse: Secondary | ICD-10-CM | POA: Diagnosis not present

## 2017-06-06 DIAGNOSIS — Z8249 Family history of ischemic heart disease and other diseases of the circulatory system: Secondary | ICD-10-CM | POA: Diagnosis not present

## 2017-06-06 DIAGNOSIS — I4891 Unspecified atrial fibrillation: Secondary | ICD-10-CM | POA: Insufficient documentation

## 2017-06-06 DIAGNOSIS — I10 Essential (primary) hypertension: Secondary | ICD-10-CM | POA: Diagnosis not present

## 2017-06-06 DIAGNOSIS — Z7951 Long term (current) use of inhaled steroids: Secondary | ICD-10-CM | POA: Insufficient documentation

## 2017-06-06 DIAGNOSIS — H409 Unspecified glaucoma: Secondary | ICD-10-CM | POA: Insufficient documentation

## 2017-06-06 DIAGNOSIS — I313 Pericardial effusion (noninflammatory): Secondary | ICD-10-CM | POA: Insufficient documentation

## 2017-06-06 DIAGNOSIS — E7800 Pure hypercholesterolemia, unspecified: Secondary | ICD-10-CM | POA: Insufficient documentation

## 2017-06-06 DIAGNOSIS — R0609 Other forms of dyspnea: Secondary | ICD-10-CM | POA: Diagnosis not present

## 2017-06-06 HISTORY — PX: RIGHT/LEFT HEART CATH AND CORONARY ANGIOGRAPHY: CATH118266

## 2017-06-06 LAB — POCT I-STAT 3, ART BLOOD GAS (G3+)
ACID-BASE EXCESS: 6 mmol/L — AB (ref 0.0–2.0)
Acid-Base Excess: 9 mmol/L — ABNORMAL HIGH (ref 0.0–2.0)
BICARBONATE: 32.6 mmol/L — AB (ref 20.0–28.0)
BICARBONATE: 35.5 mmol/L — AB (ref 20.0–28.0)
O2 SAT: 60 %
O2 Saturation: 64 %
PH ART: 7.395 (ref 7.350–7.450)
PO2 ART: 35 mmHg — AB (ref 83.0–108.0)
TCO2: 34 mmol/L — AB (ref 22–32)
TCO2: 37 mmol/L — ABNORMAL HIGH (ref 22–32)
pCO2 arterial: 54.4 mmHg — ABNORMAL HIGH (ref 32.0–48.0)
pCO2 arterial: 58 mmHg — ABNORMAL HIGH (ref 32.0–48.0)
pH, Arterial: 7.385 (ref 7.350–7.450)
pO2, Arterial: 33 mmHg — CL (ref 83.0–108.0)

## 2017-06-06 SURGERY — RIGHT/LEFT HEART CATH AND CORONARY ANGIOGRAPHY
Anesthesia: LOCAL

## 2017-06-06 MED ORDER — IOHEXOL 350 MG/ML SOLN
INTRAVENOUS | Status: DC | PRN
  Administered 2017-06-06: 65 mL via INTRAVENOUS

## 2017-06-06 MED ORDER — MIDAZOLAM HCL 2 MG/2ML IJ SOLN
INTRAMUSCULAR | Status: AC
  Filled 2017-06-06: qty 2

## 2017-06-06 MED ORDER — HEPARIN (PORCINE) IN NACL 2-0.9 UNITS/ML
INTRAMUSCULAR | Status: DC | PRN
  Administered 2017-06-06: 10 mL via INTRA_ARTERIAL

## 2017-06-06 MED ORDER — SODIUM CHLORIDE 0.9% FLUSH: 3.0000 mL | INTRAVENOUS | Status: DC | PRN

## 2017-06-06 MED ORDER — SODIUM CHLORIDE 0.9 % IV SOLN: INTRAVENOUS | Status: AC

## 2017-06-06 MED ORDER — ACETAMINOPHEN 325 MG PO TABS: 650.0000 mg | ORAL_TABLET | ORAL | Status: DC | PRN

## 2017-06-06 MED ORDER — HEPARIN SODIUM (PORCINE) 1000 UNIT/ML IJ SOLN
INTRAMUSCULAR | Status: AC
  Filled 2017-06-06: qty 1

## 2017-06-06 MED ORDER — FENTANYL CITRATE (PF) 100 MCG/2ML IJ SOLN
INTRAMUSCULAR | Status: DC | PRN
  Administered 2017-06-06: 25 ug via INTRAVENOUS

## 2017-06-06 MED ORDER — HEPARIN (PORCINE) IN NACL 2-0.9 UNITS/ML
INTRAMUSCULAR | Status: AC | PRN
  Administered 2017-06-06 (×2): 500 mL

## 2017-06-06 MED ORDER — SODIUM CHLORIDE 0.9% FLUSH: 3.0000 mL | Freq: Two times a day (BID) | INTRAVENOUS | Status: DC

## 2017-06-06 MED ORDER — VERAPAMIL HCL 2.5 MG/ML IV SOLN
INTRAVENOUS | Status: AC
  Filled 2017-06-06: qty 2

## 2017-06-06 MED ORDER — FUROSEMIDE 20 MG PO TABS: 40.0000 mg | ORAL_TABLET | Freq: Two times a day (BID) | ORAL | 6 refills | Status: DC

## 2017-06-06 MED ORDER — ONDANSETRON HCL 4 MG/2ML IJ SOLN: 4.0000 mg | Freq: Four times a day (QID) | INTRAMUSCULAR | Status: DC | PRN

## 2017-06-06 MED ORDER — COLCHICINE 0.6 MG PO TABS: 0.6000 mg | ORAL_TABLET | Freq: Every day | ORAL | 0 refills | Status: DC

## 2017-06-06 MED ORDER — LIDOCAINE HCL (PF) 1 % IJ SOLN
INTRAMUSCULAR | Status: AC
  Filled 2017-06-06: qty 30

## 2017-06-06 MED ORDER — SODIUM CHLORIDE 0.9 % IV SOLN: 250.0000 mL | INTRAVENOUS | Status: DC | PRN

## 2017-06-06 MED ORDER — LIDOCAINE HCL (PF) 1 % IJ SOLN
INTRAMUSCULAR | Status: DC | PRN
  Administered 2017-06-06 (×2): 2 mL

## 2017-06-06 MED ORDER — FENTANYL CITRATE (PF) 100 MCG/2ML IJ SOLN
INTRAMUSCULAR | Status: AC
  Filled 2017-06-06: qty 2

## 2017-06-06 MED ORDER — MIDAZOLAM HCL 2 MG/2ML IJ SOLN
INTRAMUSCULAR | Status: DC | PRN
  Administered 2017-06-06: 0.5 mg via INTRAVENOUS

## 2017-06-06 MED ORDER — HEPARIN (PORCINE) IN NACL 1000-0.9 UT/500ML-% IV SOLN
INTRAVENOUS | Status: AC
  Filled 2017-06-06: qty 1000

## 2017-06-06 MED ORDER — ASPIRIN 81 MG PO CHEW: 81.0000 mg | CHEWABLE_TABLET | ORAL | Status: DC

## 2017-06-06 MED ORDER — SODIUM CHLORIDE 0.9 % IV SOLN: INTRAVENOUS | Status: DC

## 2017-06-06 SURGICAL SUPPLY — 12 items
CATH BALLN WEDGE 5F 110CM (CATHETERS) ×2 IMPLANT
CATH IMPULSE 5F ANG/FL3.5 (CATHETERS) ×2 IMPLANT
DEVICE RAD COMP TR BAND LRG (VASCULAR PRODUCTS) ×2 IMPLANT
GUIDEWIRE INQWIRE 1.5J.035X260 (WIRE) ×1 IMPLANT
INQWIRE 1.5J .035X260CM (WIRE) ×2
KIT HEART LEFT (KITS) ×2 IMPLANT
NEEDLE PERC 21GX4CM (NEEDLE) ×2 IMPLANT
PACK CARDIAC CATHETERIZATION (CUSTOM PROCEDURE TRAY) ×2 IMPLANT
SHEATH RAIN 4/5FR (SHEATH) ×2 IMPLANT
SHEATH RAIN RADIAL 21G 6FR (SHEATH) ×2 IMPLANT
TRANSDUCER W/STOPCOCK (MISCELLANEOUS) ×2 IMPLANT
TUBING CIL FLEX 10 FLL-RA (TUBING) ×2 IMPLANT

## 2017-06-06 NOTE — Discharge Instructions (Signed)
Patient instructions:  1. Stop ibuprofen. 2. Decrease colchicine to once a day.  3. Increase furosemide to 40 mg twice a day.       Radial Site Care Refer to this sheet in the next few weeks. These instructions provide you with information about caring for yourself after your procedure. Your health care provider may also give you more specific instructions. Your treatment has been planned according to current medical practices, but problems sometimes occur. Call your health care provider if you have any problems or questions after your procedure. What can I expect after the procedure? After your procedure, it is typical to have the following:  Bruising at the radial site that usually fades within 1-2 weeks.  Blood collecting in the tissue (hematoma) that may be painful to the touch. It should usually decrease in size and tenderness within 1-2 weeks.  Follow these instructions at home:  Take medicines only as directed by your health care provider.  You may shower 24-48 hours after the procedure or as directed by your health care provider. Remove the bandage (dressing) and gently wash the site with plain soap and water. Pat the area dry with a clean towel. Do not rub the site, because this may cause bleeding.  Do not take baths, swim, or use a hot tub until your health care provider approves.  Check your insertion site every day for redness, swelling, or drainage.  Do not apply powder or lotion to the site.  Do not flex or bend the affected arm for 24 hours or as directed by your health care provider.  Do not push or pull heavy objects with the affected arm for 24 hours or as directed by your health care provider.  Do not lift over 10 lb (4.5 kg) for 5 days after your procedure or as directed by your health care provider.  Ask your health care provider when it is okay to: ? Return to work or school. ? Resume usual physical activities or sports. ? Resume sexual activity.  Do not  drive home if you are discharged the same day as the procedure. Have someone else drive you.  You may drive 24 hours after the procedure unless otherwise instructed by your health care provider.  Do not operate machinery or power tools for 24 hours after the procedure.  If your procedure was done as an outpatient procedure, which means that you went home the same day as your procedure, a responsible adult should be with you for the first 24 hours after you arrive home.  Keep all follow-up visits as directed by your health care provider. This is important. Contact a health care provider if:  You have a fever.  You have chills.  You have increased bleeding from the radial site. Hold pressure on the site. Get help right away if:  You have unusual pain at the radial site.  You have redness, warmth, or swelling at the radial site.  You have drainage (other than a small amount of blood on the dressing) from the radial site.  The radial site is bleeding, and the bleeding does not stop after 30 minutes of holding steady pressure on the site.  Your arm or hand becomes pale, cool, tingly, or numb. This information is not intended to replace advice given to you by your health care provider. Make sure you discuss any questions you have with your health care provider. Document Released: 02/02/2010 Document Revised: 06/08/2015 Document Reviewed: 07/19/2013 Elsevier Interactive Patient Education  2018 [employer].

## 2017-06-06 NOTE — Progress Notes (Signed)
I discussed the patient's situation with Dr. Radford Pax.   I am going to have Mr Sitzman stop ibuprofen, decrease colchicine to once a day.  We will increase Lasix to 40 mg bid.    At followup with Dr. Radford Pax, will start anticoagulation for flutter/fibrillation and plan DCCV if still out of rhythm.   [name] [name] 06/06/2017 11:15 AM

## 2017-06-06 NOTE — Interval H&P Note (Signed)
History and Physical Interval Note:  06/06/2017 9:47 AM  [name] [name]  has presented today for surgery, with the diagnosis of pericardial effusion  The various methods of treatment have been discussed with the patient and family. After consideration of risks, benefits and other options for treatment, the patient has consented to  Procedure(s): RIGHT/LEFT HEART CATH AND CORONARY ANGIOGRAPHY (N/A) as a surgical intervention .  The patient's history has been reviewed, patient examined, no change in status, stable for surgery.  I have reviewed the patient's chart and labs.  Questions were answered to the patient's satisfaction.     Chiante Peden [employer]

## 2017-06-10 ENCOUNTER — Encounter (HOSPITAL_COMMUNITY): Payer: Self-pay | Admitting: Cardiology

## 2017-06-10 MED FILL — Heparin Sod (Porcine)-NaCl IV Soln 1000 Unit/500ML-0.9%: INTRAVENOUS | Qty: 1000 | Status: AC

## 2017-06-10 MED FILL — Heparin Sodium (Porcine) Inj 1000 Unit/ML: INTRAMUSCULAR | Qty: 10 | Status: AC

## 2017-06-17 ENCOUNTER — Institutional Professional Consult (permissible substitution): Payer: BC Managed Care – PPO | Admitting: Internal Medicine

## 2017-06-30 ENCOUNTER — Encounter: Payer: Self-pay | Admitting: Cardiology

## 2017-06-30 ENCOUNTER — Encounter (INDEPENDENT_AMBULATORY_CARE_PROVIDER_SITE_OTHER): Payer: Self-pay

## 2017-06-30 ENCOUNTER — Ambulatory Visit: Payer: BC Managed Care – PPO | Admitting: Cardiology

## 2017-06-30 VITALS — BP 108/52 | HR 59 | Ht 69.5 in | Wt 125.8 lb

## 2017-06-30 DIAGNOSIS — J90 Pleural effusion, not elsewhere classified: Secondary | ICD-10-CM

## 2017-06-30 DIAGNOSIS — I2510 Atherosclerotic heart disease of native coronary artery without angina pectoris: Secondary | ICD-10-CM | POA: Diagnosis not present

## 2017-06-30 DIAGNOSIS — I350 Nonrheumatic aortic (valve) stenosis: Secondary | ICD-10-CM | POA: Diagnosis not present

## 2017-06-30 DIAGNOSIS — I3139 Other pericardial effusion (noninflammatory): Secondary | ICD-10-CM

## 2017-06-30 DIAGNOSIS — I10 Essential (primary) hypertension: Secondary | ICD-10-CM

## 2017-06-30 DIAGNOSIS — I480 Paroxysmal atrial fibrillation: Secondary | ICD-10-CM

## 2017-06-30 DIAGNOSIS — I313 Pericardial effusion (noninflammatory): Secondary | ICD-10-CM

## 2017-06-30 DIAGNOSIS — R0602 Shortness of breath: Secondary | ICD-10-CM

## 2017-06-30 MED ORDER — APIXABAN 5 MG PO TABS: 5.0000 mg | ORAL_TABLET | Freq: Two times a day (BID) | ORAL | 3 refills | Status: DC

## 2017-06-30 NOTE — Patient Instructions (Signed)
Medication Instructions:  Your physician has recommended you make the following change in your medication:  STOP: aspirin  START: Eliquis 5 mg two times a day   Labwork: Today for kidney function test and BNP   Testing/Procedures: Your physician has requested that you have an echocardiogram in July 2019. Echocardiography is a painless test that uses sound waves to create images of your heart. It provides your doctor with information about the size and shape of your heart and how well your heart's chambers and valves are working. This procedure takes approximately one hour. There are no restrictions for this procedure.  A chest x-ray takes a picture of the organs and structures inside the chest, including the heart, lungs, and blood vessels. This test can show several things, including, whether the heart is enlarges; whether fluid is building up in the lungs; and whether pacemaker / defibrillator leads are still in place.   Follow-Up: Your physician recommends that you schedule a follow-up appointment in: 3 months with Dr. Radford Pax    Any Other Special Instructions Will Be Listed Below (If Applicable).    Thank you for choosing [facility], RN  [phone]  If you need a refill on your cardiac medications before your next appointment, please call your pharmacy.

## 2017-06-30 NOTE — Progress Notes (Signed)
Cardiology Office Note:    Date:  07/03/2017   ID:  [name] [name], DOB Jun 12, 1935, MRN [id number]  PCP:  Maury Dus, MD  Cardiologist:  No primary care provider on file.    Referring MD: Maury Dus, MD   Chief Complaint  Patient presents with  . Coronary Artery Disease  . Hypertension  . Aortic Stenosis  . Hyperlipidemia    History of Present Illness:    [name] [name] is a 82 y.o. male with a hx of dyslipidemia and family history of CAD, bicuspid aortic valve with moderate AS and mildly dilated aortic root and mild MVP.His AS progressed over the past year and was referred to Dr. Burt Knack for evaluation of severe AS. LHC showed nonobstructive ASCAD in the LAD and ramus with hemodynamically significant RCA stenosis by FFR and underwent PCI of the RCA with DES. He subsequently underwent TAVR 07/02/2016.   He has a chronic pericardial effusion that was moderate on the day of the TAVR with no tamponade and had been present prior to the TAVR.  He was then admitted with chest pain and syncope and was found to be in new onset atrial fibrillation.  He apparently had also complained of losing 40 pounds over the past year that was unintentional.  Pulmonary nodules are noted on chest CT back in February 2019.  It was felt that his chest pain is atypical and likely musculoskeletal as well as a pleuritic component.  He was started on ibuprofen 600 mg 3 times daily along with Pepcid for 10 days.  His Plavix was stopped.  It was recommended that before starting anticoagulation with Eliquis he be treated for 2 weeks with anti-inflammatories due to the pleuritic condition of his chest pain as well as his pericardial effusion.   He was seen back by me on 05/16/2017 with complaint of a 40 pound unintentional weight loss over several months as well as pulmonary nodules that were noted on chest CT.  CRP and sedimentation rate were elevated at 48.  Given his findings of pulmonary nodules there was concern  of possible malignancy.  He was referred to [facility].  2D echocardiogram showed persistence of his pericardial effusion.  Cardiac MRI was done which showed moderate bilateral pleural effusions and a moderate loculated pericardial effusion more prominent laterally and inferiorly with no evidence of Tamponade.  EF was 54% with mildly dilated RV.  He he had evidence of right-sided heart failure when I saw him last.  I referred him to interventional radiology for diagnostic thoracentesis.  1 L of pleural fluid was removed with no evidence of malignancy on cytology.    He was referred to Dr. Algernon Huxley for right and left heart cath to rule out effusive/constrictive pericarditis. This revealed mildly elevated right and left heart filling pressures, moderate pulmonary venous hypertension, marginal cardiac output and no heme anemic evidence of constrictive pericarditis with concordance of LV and RV pressure tracings.  There was a patent RCA stent, 70% mid LAD stenosis which was unchanged from cardiac cath a year ago.  FFR to was negative at the time of cath in 2018.  His Lasix was increased to 40 mg twice daily.  It was recommended to keep him off anticoagulation for 2 additional weeks and then restart his Eliquis.  He is here today for followup and is doing quite well and feeling much better.  Marland Kitchen  He denies any chest pain or pressure, SOB, DOE, PND, orthopnea, LE edema, dizziness, palpitations or syncope. He  is compliant with his meds and is tolerating meds with no SE.     Past Medical History:  Diagnosis Date  . Actinic keratoses    Dr Glee Arvin  . Allergic rhinitis   . Aortic stenosis    s/p TAVR 06/2016  . Arthritis   . Arthritis of left hip 11/03/2014  . Benign essential HTN   . Bicuspid aortic valve   . Bilateral cataracts   . 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 BPH (benign prostatic hypertrophy)   . Coronary artery disease involving native coronary artery of native heart without angina pectoris      PCI 06/06/16 LAD proximal 60 (FFR 0.85-negative); RI 50; LCx calcified without significant stenosis; RCA mid 80 (FFR 0.68-hemodynamically significant); PCI: 2.25 x 16 mm Synergy DES to the mid RCA  . Dilated aortic root ([place])    61mm by echo 08/2015  . Glaucoma   . H/O seasonal allergies   . Hx of adenomatous colonic polyps 1991 on  . Hx of cardiovascular stress test    Myoview (10/15):  normal  . Hyperlipidemia   . Mitral valve prolapse    mild MR by echo 08/2015  . Multifocal pneumonia 12/01/2016  . New onset atrial fibrillation ([place]) 04/23/2017  . OA (osteoarthritis) of hip 10/16/2016  . PAF (paroxysmal atrial fibrillation) ([place])   . Paresthesia    Chronic left lateral thigh  . Pericardial effusion 11/05/2016  . Postural dizziness with presyncope 04/23/2017  . Pulmonary nodules   . Right heart failure ([place])   . S/P TAVR (transcatheter aortic valve replacement)     Past Surgical History:  Procedure Laterality Date  . COLONOSCOPY  multiple  . CORONARY STENT INTERVENTION N/A 06/06/2016   Procedure: Coronary Stent Intervention;  Surgeon: [name], MD;  Location: [place] [facility];  Service: Cardiovascular;  Laterality: N/A;  . CYSTOSCOPY  08/12/2016   per patient; showed a cyst on bladder , liver , and kidney , sees urologist Matilde Bash III at [facility]  . EYE SURGERY     Bilateral cataract removal  . INTRAVASCULAR PRESSURE WIRE/FFR STUDY N/A 05/29/2016   Procedure: Intravascular Pressure Wire/FFR Study;  Surgeon: [name], MD;  Location: [place] [facility];  Service: Cardiovascular;  Laterality: N/A;  . IR THORACENTESIS ASP PLEURAL SPACE W/IMG GUIDE  05/28/2017  . RIGHT/LEFT HEART CATH AND CORONARY ANGIOGRAPHY N/A 05/29/2016   Procedure: Right/Left Heart Cath and Coronary Angiography;  Surgeon: [name], MD;  Location: [place] [facility];  Service: Cardiovascular;  Laterality: N/A;  . RIGHT/LEFT HEART CATH AND CORONARY ANGIOGRAPHY N/A 06/06/2017   Procedure:  RIGHT/LEFT HEART CATH AND CORONARY ANGIOGRAPHY;  Surgeon: Larey Dresser, MD;  Location: [place] [facility];  Service: Cardiovascular;  Laterality: N/A;  . TEE WITHOUT CARDIOVERSION N/A 07/02/2016   Procedure: TRANSESOPHAGEAL ECHOCARDIOGRAM (TEE);  Surgeon: [name], MD;  Location: [place];  Service: Open Heart Surgery;  Laterality: N/A;  . TONSILLECTOMY  1949  . TOTAL HIP ARTHROPLASTY Left 10/16/2016   Procedure: LEFT TOTAL HIP ARTHROPLASTY ANTERIOR APPROACH;  Surgeon: Gaynelle Arabian, MD;  Location: [facility];  Service: Orthopedics;  Laterality: Left;  . TRANSCATHETER AORTIC VALVE REPLACEMENT, TRANSFEMORAL N/A 07/02/2016   Procedure: TRANSCATHETER AORTIC VALVE REPLACEMENT, TRANSFEMORAL;  Surgeon: [name], MD;  Location: [place];  Service: Open Heart Surgery;  Laterality: N/A;    Current Medications: Current Meds  Medication Sig  . cetaphil (CETAPHIL) cream Apply 1 application topically every other day.  . colchicine 0.6 MG tablet  Take 1 tablet (0.6 mg total) by mouth daily.  . feeding supplement, ENSURE ENLIVE, (ENSURE ENLIVE) LIQD Take 237 mLs by mouth daily.  . fluticasone (FLONASE) 50 MCG/ACT nasal spray Place 1 spray into both nostrils daily.  . furosemide (LASIX) 20 MG tablet Take 2 tablets (40 mg total) by mouth 2 (two) times daily.  Marland Kitchen latanoprost (XALATAN) 0.005 % ophthalmic solution Place 1 drop into both eyes at bedtime.  . pravastatin (PRAVACHOL) 40 MG tablet Take 40 mg by mouth at bedtime.   . timolol (BETIMOL) 0.5 % ophthalmic solution Place 1 drop into both eyes 2 (two) times daily.  . [DISCONTINUED] acetaminophen (TYLENOL) 500 MG tablet Take 1 tablet (500 mg total) by mouth daily.  . [DISCONTINUED] aspirin EC 81 MG tablet Take 81 mg daily by mouth.     Allergies:   Patient has no known allergies.   Social History   Socioeconomic History  . Marital status: Widowed    Spouse name: Not on file  . Number of children: Not on file  . Years of education: Not on file    . Highest education level: Not on file  Occupational History  . Not on file  Social Needs  . Financial resource strain: Not on file  . Food insecurity:    Worry: Not on file    Inability: Not on file  . Transportation needs:    Medical: Not on file    Non-medical: Not on file  Tobacco Use  . Smoking status: Former Smoker    Last attempt to quit: 07/14/1960    Years since quitting: 57.0  . Smokeless tobacco: Never Used  Substance and Sexual Activity  . Alcohol use: No    Frequency: Never  . Drug use: No  . Sexual activity: Not on file  Lifestyle  . Physical activity:    Days per week: Not on file    Minutes per session: Not on file  . Stress: Not on file  Relationships  . Social connections:    Talks on phone: Not on file    Gets together: Not on file    Attends religious service: Not on file    Active member of club or organization: Not on file    Attends meetings of clubs or organizations: Not on file    Relationship status: Not on file  Other Topics Concern  . Not on file  Social History Narrative  . Not on file     Family History: The patient's family history includes Emphysema in his mother; Heart attack in his brother; Heart disease in his brother; Pancreatic cancer in his father. There is no history of Colon cancer or Stomach cancer.  ROS:   Please see the history of present illness.    ROS  All other systems reviewed and negative.   EKGs/Labs/Other Studies Reviewed:    The following studies were reviewed today: Cardiac cath, thoracentesis report  EKG:  EKG is not ordered today.    Recent Labs: 11/05/2016: ALT 9 04/23/2017: Magnesium 2.0 05/16/2017: TSH 2.740 06/04/2017: Hemoglobin 13.3; Platelets 313 07/01/2017: BUN 30; Creatinine, Ser 0.91; NT-Pro BNP 2,182; Potassium 4.6; Sodium 141   Recent Lipid Panel    Component Value Date/Time   CHOL 120 11/05/2016 0936   TRIG 81 11/05/2016 0936   HDL 39 (L) 11/05/2016 0936   CHOLHDL 3.1 11/05/2016 0936    LDLCALC 65 11/05/2016 0936    Physical Exam:    VS:  BP (!) 108/52   Pulse Marland Kitchen)  59   Ht 5' 9.5" (1.765 m)   Wt 125 lb 12.8 oz (57.1 kg)   SpO2 97%   BMI 18.31 kg/m     Wt Readings from Last 3 Encounters:  06/30/17 125 lb 12.8 oz (57.1 kg)  06/06/17 145 lb (65.8 kg)  05/16/17 150 lb (68 kg)     GEN:  Well nourished, well developed in no acute distress HEENT: Normal NECK: No JVD; No carotid bruits LYMPHATICS: No lymphadenopathy CARDIAC: RRR, no murmurs, rubs, gallops RESPIRATORY:  Decreased BS at bases ABDOMEN: Soft, non-tender, non-distended MUSCULOSKELETAL:  No edema; No deformity  SKIN: Warm and dry NEUROLOGIC:  Alert and oriented x 3 PSYCHIATRIC:  Normal affect   ASSESSMENT:    1. Nonrheumatic aortic valve stenosis   2. Benign essential HTN   3. Coronary artery disease involving native coronary artery of native heart without angina pectoris   4. Pericardial effusion   5. PAF (paroxysmal atrial fibrillation) ([place])   6. SOB (shortness of breath)   7. Pleural effusion    PLAN:    In order of problems listed above:  1.  Severe AS -  s/p TAVR with stable AVR on echo 05/23/2017.  Mean AV gradient 31mmHg.    2.  HTN - BP is well controlled on exam today.  He is currently not on any antihypertensive meds.   3.  ASCAD - LHC showed nonobstructive ASCAD in the LAD and ramus with hemodynamically significant RCA stenosis by FFR and underwent PCI of the RCA with DES.  He has not had any anginal sx.  He will continue on statin.  He is not on ASA due to apixaban.    4.  Moderate pericardial effusion - he has had an extensive workup with cardiac MRI, echo and subsequently right and left heart cath.  He has also had pleural effusions and s/p thoracentesis with transudate and no malignant cells.  MRI and cath with no evidence of effusive-constrictive pericarditis.  No evidence of tamponade and has been stable since TAVR.  No indication for pericardiocentesis at this time as pleural  effusion showed transudate an likely same process as pleural effusions.  Continue Colchicine 0.6mg  daily.   5.  PAF - he is maintaining NSR on exam.  I will restart Eliquis today at 5mg  BID.  BP too soft for addition of BB.  Repeat echo in 2 weeks to reassess pericardial effusion since we are starting anticoagulation.    6.  Pleural effusions - he had a thoracentesis recently but now has decreased breath sounds again.  I will check a PA and lat cxray.  I will also check a BNP.  He has pulmonary nodules on chest CT and is to see Dr. Chase Caller tomorrow.     Medication Adjustments/Labs and Tests Ordered: Current medicines are reviewed at length with the patient today.  Concerns regarding medicines are outlined above.  Orders Placed This Encounter  Procedures  . DG Chest 2 View  . Basic metabolic panel  . Pro b natriuretic peptide (BNP)  . ECHOCARDIOGRAM COMPLETE   Meds ordered this encounter  Medications  . apixaban (ELIQUIS) 5 MG TABS tablet    Sig: Take 1 tablet (5 mg total) by mouth 2 (two) times daily.    Dispense:  180 tablet    Refill:  3    Signed, Fransico Him, MD  07/03/2017 9:31 PM    [place]

## 2017-07-01 ENCOUNTER — Other Ambulatory Visit: Payer: BC Managed Care – PPO

## 2017-07-01 DIAGNOSIS — R0602 Shortness of breath: Secondary | ICD-10-CM

## 2017-07-01 DIAGNOSIS — I480 Paroxysmal atrial fibrillation: Secondary | ICD-10-CM

## 2017-07-01 LAB — BASIC METABOLIC PANEL
BUN/Creatinine Ratio: 33 — ABNORMAL HIGH (ref 10–24)
BUN: 30 mg/dL — ABNORMAL HIGH (ref 8–27)
CALCIUM: 9.8 mg/dL (ref 8.6–10.2)
CHLORIDE: 98 mmol/L (ref 96–106)
CO2: 28 mmol/L (ref 20–29)
Creatinine, Ser: 0.91 mg/dL (ref 0.76–1.27)
GFR calc Af Amer: 92 mL/min/{1.73_m2} (ref >59)
GFR calc non Af Amer: 80 mL/min/{1.73_m2} (ref >59)
Glucose: 72 mg/dL (ref 65–99)
POTASSIUM: 4.6 mmol/L (ref 3.5–5.2)
Sodium: 141 mmol/L (ref 134–144)

## 2017-07-01 LAB — PRO B NATRIURETIC PEPTIDE: NT-PRO BNP: 2182 pg/mL — AB (ref 0–486)

## 2017-07-03 DIAGNOSIS — J90 Pleural effusion, not elsewhere classified: Secondary | ICD-10-CM | POA: Insufficient documentation

## 2017-07-07 ENCOUNTER — Telehealth: Payer: Self-pay | Admitting: Cardiology

## 2017-07-07 NOTE — Telephone Encounter (Signed)
New Message   Pt is returning call for nurse about labs. Please call

## 2017-07-07 NOTE — Telephone Encounter (Signed)
Notes recorded by Teressa Senter, RN on 07/07/2017 at 11:26 AM EDT Pt made aware of lab results. He states he feels much better and is thankful for the call  Notes recorded by Sueanne Margarita, MD on 07/03/2017 at 1:24 PM EDT Find out how he is feeling  Notes recorded by Sueanne Margarita, MD on 07/03/2017 at 10:36 AM EDT BNP much improved and labs are stable for renal function. Continue current dose of diuretics.

## 2017-07-11 ENCOUNTER — Other Ambulatory Visit: Payer: Self-pay | Admitting: Cardiology

## 2017-07-14 ENCOUNTER — Telehealth: Payer: Self-pay | Admitting: Cardiology

## 2017-07-14 NOTE — Telephone Encounter (Signed)
New Message     Patient is wanting to speak with a nurse to discuss the medications that he takes. Please call.

## 2017-07-14 NOTE — Telephone Encounter (Signed)
Pt called anting clarification on if he should be taking colchicine 0.6 mg daily, per OV with Dr.Turner on 06/30/17  continue Colchicine 0.6mg  daily. Pt stated understanding and thankful for the call

## 2017-07-25 ENCOUNTER — Ambulatory Visit: Payer: BC Managed Care – PPO | Admitting: Physician Assistant

## 2017-07-28 ENCOUNTER — Ambulatory Visit (HOSPITAL_COMMUNITY): Payer: BC Managed Care – PPO | Attending: Cardiovascular Disease

## 2017-07-28 ENCOUNTER — Other Ambulatory Visit: Payer: Self-pay

## 2017-07-28 DIAGNOSIS — E785 Hyperlipidemia, unspecified: Secondary | ICD-10-CM | POA: Diagnosis not present

## 2017-07-28 DIAGNOSIS — I10 Essential (primary) hypertension: Secondary | ICD-10-CM | POA: Diagnosis not present

## 2017-07-28 DIAGNOSIS — I313 Pericardial effusion (noninflammatory): Secondary | ICD-10-CM | POA: Diagnosis not present

## 2017-07-28 DIAGNOSIS — Z8249 Family history of ischemic heart disease and other diseases of the circulatory system: Secondary | ICD-10-CM | POA: Diagnosis not present

## 2017-07-28 DIAGNOSIS — I2510 Atherosclerotic heart disease of native coronary artery without angina pectoris: Secondary | ICD-10-CM | POA: Insufficient documentation

## 2017-07-28 DIAGNOSIS — I350 Nonrheumatic aortic (valve) stenosis: Secondary | ICD-10-CM | POA: Insufficient documentation

## 2017-07-28 DIAGNOSIS — I361 Nonrheumatic tricuspid (valve) insufficiency: Secondary | ICD-10-CM | POA: Insufficient documentation

## 2017-07-28 DIAGNOSIS — I3139 Other pericardial effusion (noninflammatory): Secondary | ICD-10-CM

## 2017-07-29 ENCOUNTER — Ambulatory Visit: Payer: BC Managed Care – PPO | Admitting: Physician Assistant

## 2017-07-29 ENCOUNTER — Telehealth: Payer: Self-pay | Admitting: Cardiology

## 2017-07-29 NOTE — Telephone Encounter (Signed)
Result Notes for ECHOCARDIOGRAM COMPLETE   Notes recorded by Frederik Schmidt, RN on 07/29/2017 at 3:22 PM EDT Spoke to patient and informed him of Echo results. He verbalized understanding.

## 2017-07-29 NOTE — Telephone Encounter (Signed)
New Message   Pt returning call for results. Please call

## 2017-07-31 ENCOUNTER — Ambulatory Visit: Payer: BC Managed Care – PPO | Admitting: Internal Medicine

## 2017-07-31 ENCOUNTER — Other Ambulatory Visit (INDEPENDENT_AMBULATORY_CARE_PROVIDER_SITE_OTHER): Payer: BC Managed Care – PPO

## 2017-07-31 ENCOUNTER — Encounter: Payer: Self-pay | Admitting: Internal Medicine

## 2017-07-31 ENCOUNTER — Ambulatory Visit (INDEPENDENT_AMBULATORY_CARE_PROVIDER_SITE_OTHER)
Admission: RE | Admit: 2017-07-31 | Discharge: 2017-07-31 | Disposition: A | Discharge status: Home / Self Care | Payer: BC Managed Care – PPO | Source: Ambulatory Visit | Attending: Internal Medicine | Admitting: Internal Medicine

## 2017-07-31 VITALS — BP 102/70 | HR 65 | Ht 69.5 in | Wt 122.2 lb

## 2017-07-31 DIAGNOSIS — R634 Abnormal weight loss: Secondary | ICD-10-CM

## 2017-07-31 DIAGNOSIS — J90 Pleural effusion, not elsewhere classified: Secondary | ICD-10-CM

## 2017-07-31 DIAGNOSIS — I3139 Other pericardial effusion (noninflammatory): Secondary | ICD-10-CM

## 2017-07-31 DIAGNOSIS — R5382 Chronic fatigue, unspecified: Secondary | ICD-10-CM | POA: Diagnosis not present

## 2017-07-31 DIAGNOSIS — I313 Pericardial effusion (noninflammatory): Secondary | ICD-10-CM | POA: Diagnosis not present

## 2017-07-31 LAB — SEDIMENTATION RATE: SED RATE: 34 mm/h — AB (ref 0–20)

## 2017-07-31 NOTE — Progress Notes (Signed)
Subjective:    Patient ID: [name] [name], male    DOB: 1935/02/02, 82 y.o.   MRN: [id number]  PCP Maury Dus, MD   HPI  IOV 07/31/2017   Chief Complaint  Patient presents with  . Consult    Referred by Dr. Radford Pax due to pericardial effusion.  Pt has had a cardiac cath performed 5/24 and also a thoracentesis performed 5/15. Pt states he has a lot of phlegm that is white in color.    [name] [name] , 82 y.o. , with dob 1935-10-24 and male ,Not Hispanic or Latino from [place] [id number] - presents to lung  clinic for evaluation of pleuropericardial effusion associated with weight loss.history is gained from talking to him and review of the referring physician notes and old chart. It is a very complicated complex history. As best as I can tell in May 2018 he underwent coronary artery stent RCA and in June 2018 and underwent TAVR rom severe aortic stenosis. He was known to have had chronic pericardial effusion even at the time off TAVR in June 2018. He tells me starting fall 2018he started noticing weight loss and cough and was? Admitted for pneumonia. After that he lost30-40 pounds. These problems persisted through the spring of 2019 when they settled. In the midst of this in February 2019 he underwent a CT scan of the chestthat I personally visualized and shows groundglass opacities with lung nodules but no pleural effusion at this time. He had a follow-up CT chest in May 2019 at which time the cough had resolved. Concomitant with the resolution of the cough is lung nodules and ground glass opacities have resolved but he had new onset bilateral pleural effusion. He then had left thoracentesis which she says was 1 L middle May 2019which I review the results and shows a transudate. He had a follow-up chest x-ray that showed resolution of the  Pleural effusion. After that he has not had any further radiologic imaging of the chest. 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 He underwent echocardiogram a few days ago July 2019 as follow-up and the pericardial effusion has resolved. Since May 2019 he does not have the cough the weight loss has stabilized. However he has not been able to gain weight. He is now able to work 20 minutes in the yard but then after that feels extremely exhausted that he has to go to the bedhe is not happy with this outcome he wants to be able to do more but he does admit that this is better than 8 months ago and even a few months ago.He has been referred here because of the constellation of weight loss in the pericardial and pleural effusions.  He denies any other stigmata of autoimmune vasculitis disease including malar rash or photophobia or oral ulcers. He does have easy bruising but he is on antiplatelet therapy   Results for KARAS, PICKERILL (MRN [id number]) as of 07/31/2017 09:25  Ref. Range 05/08/2017 10:55  Anti Nuclear Antibody(ANA) Latest Ref Range: Negative  Negative  RA Latex Turbid. Latest Ref Range: 0.0 - 13.9 IU/mL <10.0     Simple office walk 185 feet x  3 laps goal with forehead probe 07/31/2017   O2 used Room air  Number laps completed 3  Comments about pace normal  Resting Pulse Ox/HR 97% and 69/min  Final Pulse Ox/HR 100% and  64/min  Desaturated </= 88% no  Desaturated <= 3% points no  Got Tachycardic >/= 90/min no  Symptoms at end of test Not reported  Miscellaneous comments none   Most recent blood labs in 11.3 g percent May 2019, sedimentation rate 58 and April 2019nd creatinine 0.9 mg percent June 2019    has a past medical history of Actinic keratoses, Allergic rhinitis, Aortic stenosis, Arthritis, Arthritis of left hip (11/03/2014), Benign essential HTN, Bicuspid aortic valve, Bilateral cataracts, 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), BPH (benign prostatic hypertrophy), Coronary artery disease involving native  coronary artery of native heart without angina pectoris, Dilated aortic root ([place]), Glaucoma, H/O seasonal allergies, adenomatous colonic polyps (1991 on), cardiovascular stress test, Hyperlipidemia, Mitral valve prolapse, Multifocal pneumonia (12/01/2016), New onset atrial fibrillation ([place]) (04/23/2017), OA (osteoarthritis) of hip (10/16/2016), PAF (paroxysmal atrial fibrillation) ([place]), Paresthesia, Pericardial effusion (11/05/2016), Postural dizziness with presyncope (04/23/2017), Pulmonary nodules, Right heart failure ([place]), and S/P TAVR (transcatheter aortic valve replacement).   reports that he quit smoking about 57 years ago. His smoking use included cigarettes. He has a 1.00 pack-year smoking history. He has never used smokeless tobacco.  Past Surgical History:  Procedure Laterality Date  . COLONOSCOPY  multiple  . CORONARY STENT INTERVENTION N/A 06/06/2016   Procedure: Coronary Stent Intervention;  Surgeon: [name], MD;  Location: [facility];  Service: Cardiovascular;  Laterality: N/A;  . CYSTOSCOPY  08/12/2016   per patient; showed a cyst on bladder , liver , and kidney , sees urologist Matilde Bash III at [facility]  . EYE SURGERY     Bilateral cataract removal  . INTRAVASCULAR PRESSURE WIRE/FFR STUDY N/A 05/29/2016   Procedure: Intravascular Pressure Wire/FFR Study;  Surgeon: [name], MD;  Location: [facility];  Service: Cardiovascular;  Laterality: N/A;  . IR THORACENTESIS ASP PLEURAL SPACE W/IMG GUIDE  05/28/2017  . RIGHT/LEFT HEART CATH AND CORONARY ANGIOGRAPHY N/A 05/29/2016   Procedure: Right/Left Heart Cath and Coronary Angiography;  Surgeon: [name], MD;  Location: [facility];  Service: Cardiovascular;  Laterality: N/A;  . RIGHT/LEFT HEART CATH AND CORONARY ANGIOGRAPHY N/A 06/06/2017   Procedure: RIGHT/LEFT HEART CATH AND CORONARY ANGIOGRAPHY;  Surgeon: Larey Dresser, MD;  Location: [facility];  Service: Cardiovascular;   Laterality: N/A;  . TEE WITHOUT CARDIOVERSION N/A 07/02/2016   Procedure: TRANSESOPHAGEAL ECHOCARDIOGRAM (TEE);  Surgeon: [name], MD;  Location: [facility];  Service: Open Heart Surgery;  Laterality: N/A;  . TONSILLECTOMY  1949  . TOTAL HIP ARTHROPLASTY Left 10/16/2016   Procedure: LEFT TOTAL HIP ARTHROPLASTY ANTERIOR APPROACH;  Surgeon: Gaynelle Arabian, MD;  Location: [facility];  Service: Orthopedics;  Laterality: Left;  . TRANSCATHETER AORTIC VALVE REPLACEMENT, TRANSFEMORAL N/A 07/02/2016   Procedure: TRANSCATHETER AORTIC VALVE REPLACEMENT, TRANSFEMORAL;  Surgeon: [name], MD;  Location: [facility];  Service: Open Heart Surgery;  Laterality: N/A;    No Known Allergies  Immunization History  Administered Date(s) Administered  . Influenza, High Dose Seasonal PF 10/17/2016    Family History  Problem Relation Age of Onset  . Emphysema Mother   . Pancreatic cancer Father   . Heart attack Brother   . Heart disease Brother   . Colon cancer Neg Hx   . Stomach cancer Neg Hx      Current Outpatient Medications:  .  apixaban (ELIQUIS) 5 MG TABS tablet, Take 1 tablet (5 mg total) by mouth 2 (two) times daily., Disp: 180 tablet, Rfl: 3 .  colchicine 0.6 MG tablet,  Take 1 tablet (0.6 mg total) by mouth daily., Disp: 180 tablet, Rfl: 0 .  fluticasone (FLONASE) 50 MCG/ACT nasal spray, Place 1 spray into both nostrils daily., Disp: , Rfl:  .  furosemide (LASIX) 20 MG tablet, Take 2 tablets (40 mg total) by mouth 2 (two) times daily., Disp: 120 tablet, Rfl: 6 .  latanoprost (XALATAN) 0.005 % ophthalmic solution, Place 1 drop into both eyes at bedtime., Disp: , Rfl:  .  pravastatin (PRAVACHOL) 40 MG tablet, Take 40 mg by mouth at bedtime. , Disp: , Rfl:  .  timolol (BETIMOL) 0.5 % ophthalmic solution, Place 1 drop into both eyes 2 (two) times daily., Disp: , Rfl:  .  feeding supplement, ENSURE ENLIVE, (ENSURE ENLIVE) LIQD, Take 237 mLs by mouth daily. (Patient not taking: Reported on 07/31/2017),  Disp: 30 Bottle, Rfl: 0    Review of Systems  Constitutional: Negative for fever and unexpected weight change.  HENT: Positive for congestion. Negative for dental problem, ear pain, nosebleeds, postnasal drip, rhinorrhea, sinus pressure, sneezing, sore throat and trouble swallowing.   Eyes: Negative for redness and itching.  Respiratory: Negative for cough, chest tightness, shortness of breath and wheezing.   Cardiovascular: Negative for palpitations and leg swelling.  Gastrointestinal: Negative for nausea and vomiting.  Genitourinary: Negative for dysuria.  Musculoskeletal: Negative for joint swelling.  Skin: Negative for rash.  Allergic/Immunologic: Positive for environmental allergies. Negative for food allergies and immunocompromised state.  Neurological: Negative for headaches.  Hematological: Bruises/bleeds easily.  Psychiatric/Behavioral: Negative for dysphoric mood. The patient is not nervous/anxious.        Objective:   Physical Exam  Constitutional: He is oriented to person, place, and time. No distress.  thin  HENT:  Head: Normocephalic and atraumatic.  Right Ear: External ear normal.  Left Ear: External ear normal.  Mouth/Throat: Oropharynx is clear and moist. No oropharyngeal exudate.  Mild postnasal drip present  Eyes: Pupils are equal, round, and reactive to light. Conjunctivae and EOM are normal. Right eye exhibits no discharge. Left eye exhibits no discharge. No scleral icterus.  Neck: Normal range of motion. Neck supple. No JVD present. No tracheal deviation present. No thyromegaly present.  Cardiovascular: Normal rate, regular rhythm and intact distal pulses. Exam reveals no gallop and no friction rub.  No murmur heard. Pulmonary/Chest: Effort normal and breath sounds normal. No respiratory distress. He has no wheezes. He has no rales. He exhibits no tenderness.  Abdominal: Soft. Bowel sounds are normal. He exhibits no distension and no mass. There is no  tenderness. There is no rebound and no guarding.  Musculoskeletal: Normal range of motion. He exhibits no edema or tenderness.  Lymphadenopathy:    He has no cervical adenopathy.  Neurological: He is alert and oriented to person, place, and time. He has normal reflexes. No cranial nerve deficit. Coordination normal.  Skin: Skin is warm and dry. No rash noted. He is not diaphoretic. No erythema. No pallor.  Psychiatric: He has a normal mood and affect. His behavior is normal. Judgment and thought content normal.  Nursing note and vitals reviewed.   Today's Vitals   07/31/17 0921  BP: 102/70  Pulse: 65  SpO2: 100%  Weight: 122 lb 3.2 oz (55.4 kg)  Height: 5' 9.5" (1.765 m)    Estimated body mass index is 17.79 kg/m as calculated from the following:   Height as of this encounter: 5' 9.5" (1.765 m).   Weight as of this encounter: 122 lb 3.2 oz (55.4 kg).  Assessment & Plan:     ICD-10-CM   1. Pleural effusion J90   2. Chronic fatigue R53.82   3. Pericardial effusion I31.3   4. Weight loss, non-intentional R63.4     I wonder if he had some  viral prodrome in fall of 2018 that then resolved by spring of 2019 that correlated with resolution of pulmonary nodules and groundglass opacities. He had associated 40 pound weight loss. Subsequently developed pleural effusions in the background of chronic pericardial effusion. Therefore this constellation raises the question of autoimmune/vasculitis. Noted that April 2019 ANA and rheumatoid factor were negative. At this point in time he is feeling better and clinically does not seem to have pleural effusion either. The best thing to do is to repeat vasculitis profile and autoimmune profile and a chest x-ray and I will see him back in 1 month. At one month if he isstill stable then he can have expectant follow-up but if he still continues to have persistent exertional fatigueand he feels that he wants to be able to do more then we'll do  pulmonary stress test  He is agreeable with this plan   Dr. Brand Males, M.D., [facility].C.P Pulmonary and Critical Care Medicine Staff Physician, [place] Director - Interstitial Lung Disease  Program  [facility] [place] [place], [place], [address]  Pager: [phone], If no answer or between  15:00h - 7:00h: call [phone] Telephone: [phone]

## 2017-07-31 NOTE — Patient Instructions (Addendum)
ICD-10-CM   1. Pleural effusion J90   2. Chronic fatigue R53.82   3. Pericardial effusion I31.3   4. Weight loss, non-intentional R63.4      Need to see if autoimmune vasculitis disease is present to link all these Glad no pericardial effusion on echo 07/28/2017 Need to see if pleural effusion has come back  Plan Do chest x-ray 2 view 07/31/2017 Do  Serum: ESR, ACE, ANA, DS-DNA, RF, anti-CCP, ssA, ssB, scl-70, ANCA screen, MPO, PR-3, Total CK,  RNP, Aldolase,  - will call with results  Followup 1 month - if above tests are negative and fatigue has not improved in the next one month will discuss with Dr. Radford Pax and request a pulmonary stress test

## 2017-08-03 LAB — ANTIPROTEINASE 3 (PR-3) ABS

## 2017-08-03 LAB — ANTIMYELOPEROXIDASE (MPO) ABS

## 2017-08-05 LAB — ANCA SCREEN W REFLEX TITER: ANCA SCREEN: NEGATIVE

## 2017-08-05 LAB — ANGIOTENSIN CONVERTING ENZYME: Angiotensin-Converting Enzyme: 24 U/L (ref 9–67)

## 2017-08-05 LAB — CK TOTAL AND CKMB (NOT AT ARMC)
CK TOTAL: 63 U/L (ref 44–196)
CK, MB: 2 ng/mL (ref 0–5.0)
Relative Index: 3.2 (ref 0–4.0)

## 2017-08-05 LAB — SJOGREN'S SYNDROME ANTIBODS(SSA + SSB)
SSA (RO) (ENA) ANTIBODY, IGG: NEGATIVE AI
SSB (La) (ENA) Antibody, IgG: <1 AI

## 2017-08-05 LAB — CYCLIC CITRUL PEPTIDE ANTIBODY, IGG: Cyclic Citrullin Peptide Ab: <16 UNITS

## 2017-08-05 LAB — ANA: ANA: NEGATIVE

## 2017-08-05 LAB — ALDOLASE: Aldolase: 5.9 U/L (ref <=8.1)

## 2017-08-05 LAB — ANTI-DNA ANTIBODY, DOUBLE-STRANDED: ds DNA Ab: <1 IU/mL

## 2017-08-05 LAB — ANTI-SCLERODERMA ANTIBODY: SCLERODERMA (SCL-70) (ENA) ANTIBODY, IGG: NEGATIVE AI

## 2017-08-05 LAB — RNP ANTIBODY: Ribonucleic Protein(ENA) Antibody, IgG: <1 AI

## 2017-08-05 LAB — RHEUMATOID FACTOR: Rhuematoid fact SerPl-aCnc: <14 IU/mL (ref <14)

## 2017-08-05 NOTE — Progress Notes (Signed)
[facility]                                       Cardiology Office Note    Date:  08/06/2017   ID:  [name] [name], DOB 1935-04-30, MRN [id number]  PCP:  Maury Dus, MD  Cardiologist: Dr. Radford Pax / Dr. Burt Knack & Dr. Roxy Manns ( TAVR )   CC: 1 year s/p TAVR   History of Present Illness:  [name] [name] is a 82 y.o. male with a history of CAD s/p DES to RCA, chronic pleural effusion and pericardial effusion, HLD, PAF on Eliquis, bicuspid AV with severe AS s/p TAVR (07/02/16) who presents to clinic for follow up.   The patient developed severe symptomatic aortic stenosis and was treated with TAVR using a 29 mm transcatheter heart valve via a percutaneous transfemoral approach on 07/02/2016. The patient was also noted to have a moderate pericardial effusion at the time of the procedure without significant change on the postoperative day #1 echo. His post operative course was uncomplicated.   Admitted in 04/2017 for new onset afib and started on Eliquis. Dr. Radford Pax has also conducted an extensive work up of his pericardial effusion and pleural effusions. Thoracocentesis yielded transudative fluid and there has been no findings of malignancy.   Repeat 2D ECHO 07/28/17 showed EF 60-65%, normally functioning TAVR valve with mean gradient of 3 mm Hg and no PVL and no pericardial effusion.  Today he presents to clinic for follow up. He does feel better. He gets occasional dizziness but no syncope. Mostly gets dizziness with sitting to standing. He also complains of fatigue. He is not as active as he used to be 2/2 fatigue. His breathing seems to be good. He only gets dyspnea with moderate exertion such as a long walk. He can go up a flight of stairs with no issues. No chest pain. No LE edema, orthopnea or PND.   Past Medical History:  Diagnosis Date  . Actinic keratoses    Dr Glee Arvin  . Allergic rhinitis   . Aortic stenosis    s/p TAVR  06/2016  . Arthritis   . Arthritis of left hip 11/03/2014  . Benign essential HTN   . Bicuspid aortic valve   . Bilateral cataracts   . 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 BPH (benign prostatic hypertrophy)   . Coronary artery disease involving native coronary artery of native heart without angina pectoris    PCI 06/06/16 LAD proximal 60 (FFR 0.85-negative); RI 50; LCx calcified without significant stenosis; RCA mid 80 (FFR 0.68-hemodynamically significant); PCI: 2.25 x 16 mm Synergy DES to the mid RCA  . Dilated aortic root ([place])    17mm by echo 08/2015  . Glaucoma   . H/O seasonal allergies   . Hx of adenomatous colonic polyps 1991 on  . Hx of cardiovascular stress test    Myoview (10/15):  normal  . Hyperlipidemia   . Mitral valve prolapse    mild MR by echo 08/2015  . Multifocal pneumonia 12/01/2016  . New onset atrial fibrillation ([place]) 04/23/2017  . OA (osteoarthritis) of hip 10/16/2016  . PAF (paroxysmal atrial fibrillation) ([place])   . Paresthesia    Chronic left lateral thigh  . Pericardial effusion 11/05/2016  . Postural dizziness with presyncope 04/23/2017  . Pulmonary nodules   . Right heart failure ([place])   .  S/P TAVR (transcatheter aortic valve replacement)     Past Surgical History:  Procedure Laterality Date  . COLONOSCOPY  multiple  . CORONARY STENT INTERVENTION N/A 06/06/2016   Procedure: Coronary Stent Intervention;  Surgeon: [name], MD;  Location: [facility];  Service: Cardiovascular;  Laterality: N/A;  . CYSTOSCOPY  08/12/2016   per patient; showed a cyst on bladder , liver , and kidney , sees urologist Matilde Bash III at [facility]  . EYE SURGERY     Bilateral cataract removal  . INTRAVASCULAR PRESSURE WIRE/FFR STUDY N/A 05/29/2016   Procedure: Intravascular Pressure Wire/FFR Study;  Surgeon: [name], MD;  Location: [facility];  Service: Cardiovascular;  Laterality: N/A;  . IR THORACENTESIS ASP PLEURAL SPACE W/IMG GUIDE  05/28/2017  .  RIGHT/LEFT HEART CATH AND CORONARY ANGIOGRAPHY N/A 05/29/2016   Procedure: Right/Left Heart Cath and Coronary Angiography;  Surgeon: [name], MD;  Location: [facility];  Service: Cardiovascular;  Laterality: N/A;  . RIGHT/LEFT HEART CATH AND CORONARY ANGIOGRAPHY N/A 06/06/2017   Procedure: RIGHT/LEFT HEART CATH AND CORONARY ANGIOGRAPHY;  Surgeon: Larey Dresser, MD;  Location: [facility];  Service: Cardiovascular;  Laterality: N/A;  . TEE WITHOUT CARDIOVERSION N/A 07/02/2016   Procedure: TRANSESOPHAGEAL ECHOCARDIOGRAM (TEE);  Surgeon: [name], MD;  Location: [facility];  Service: Open Heart Surgery;  Laterality: N/A;  . TONSILLECTOMY  1949  . TOTAL HIP ARTHROPLASTY Left 10/16/2016   Procedure: LEFT TOTAL HIP ARTHROPLASTY ANTERIOR APPROACH;  Surgeon: Gaynelle Arabian, MD;  Location: [facility];  Service: Orthopedics;  Laterality: Left;  . TRANSCATHETER AORTIC VALVE REPLACEMENT, TRANSFEMORAL N/A 07/02/2016   Procedure: TRANSCATHETER AORTIC VALVE REPLACEMENT, TRANSFEMORAL;  Surgeon: [name], MD;  Location: [facility];  Service: Open Heart Surgery;  Laterality: N/A;    Current Medications: Outpatient Medications Prior to Visit  Medication Sig Dispense Refill  . apixaban (ELIQUIS) 5 MG TABS tablet Take 1 tablet (5 mg total) by mouth 2 (two) times daily. 180 tablet 3  . colchicine 0.6 MG tablet Take 1 tablet (0.6 mg total) by mouth daily. 180 tablet 0  . feeding supplement, ENSURE ENLIVE, (ENSURE ENLIVE) LIQD Take 237 mLs by mouth daily. 30 Bottle 0  . fluticasone (FLONASE) 50 MCG/ACT nasal spray Place 1 spray into both nostrils daily.    . furosemide (LASIX) 20 MG tablet Take 2 tablets (40 mg total) by mouth 2 (two) times daily. 120 tablet 6  . latanoprost (XALATAN) 0.005 % ophthalmic solution Place 1 drop into both eyes at bedtime.    . pravastatin (PRAVACHOL) 40 MG tablet Take 40 mg by mouth at bedtime.     . timolol (BETIMOL) 0.5 % ophthalmic solution Place 1 drop into both  eyes 2 (two) times daily.     No facility-administered medications prior to visit.      Allergies:   Patient has no known allergies.   Social History   Socioeconomic History  . Marital status: Widowed    Spouse name: Not on file  . Number of children: Not on file  . Years of education: Not on file  . Highest education level: Not on file  Occupational History  . Not on file  Social Needs  . Financial resource strain: Not on file  . Food insecurity:    Worry: Not on file    Inability: Not on file  . Transportation needs:    Medical: Not on file    Non-medical: Not on file  Tobacco Use  .  Smoking status: Former Smoker    Packs/day: 1.00    Years: 1.00    Pack years: 1.00    Types: Cigarettes    Last attempt to quit: 07/14/1960    Years since quitting: 57.1  . Smokeless tobacco: Never Used  Substance and Sexual Activity  . Alcohol use: No    Frequency: Never  . Drug use: No  . Sexual activity: Not on file  Lifestyle  . Physical activity:    Days per week: Not on file    Minutes per session: Not on file  . Stress: Not on file  Relationships  . Social connections:    Talks on phone: Not on file    Gets together: Not on file    Attends religious service: Not on file    Active member of club or organization: Not on file    Attends meetings of clubs or organizations: Not on file    Relationship status: Not on file  Other Topics Concern  . Not on file  Social History Narrative  . Not on file     Family History:  The patient's family history includes Emphysema in his mother; Heart attack in his brother; Heart disease in his brother; Pancreatic cancer in his father.      ROS:   Please see the history of present illness.    ROS All other systems reviewed and are negative.   PHYSICAL EXAM:   VS:  BP 116/60   Pulse 70   Ht 5\' 9"  (1.753 m)   Wt 122 lb (55.3 kg)   SpO2 (!) 87%   BMI 18.02 kg/m    GEN: thin white male. HEENT: normal  Neck: no JVD, carotid  bruits, or masses Cardiac: RRR; no murmurs, rubs, or gallops,no edema  Respiratory:  clear to auscultation bilaterally, normal work of breathing GI: soft, nontender, nondistended, + BS MS: no deformity or atrophy  Skin: warm and dry, no rash Neuro:  Alert and Oriented x 3, Strength and sensation are intact Psych: euthymic mood, full affec   Wt Readings from Last 3 Encounters:  08/06/17 122 lb (55.3 kg)  07/31/17 122 lb 3.2 oz (55.4 kg)  06/30/17 125 lb 12.8 oz (57.1 kg)      Studies/Labs Reviewed:   EKG:  EKG is NOT ordered today.    Recent Labs: 11/05/2016: ALT 9 04/23/2017: Magnesium 2.0 05/16/2017: TSH 2.740 06/04/2017: Hemoglobin 13.3; Platelets 313 07/01/2017: BUN 30; Creatinine, Ser 0.91; NT-Pro BNP 2,182; Potassium 4.6; Sodium 141   Lipid Panel    Component Value Date/Time   CHOL 120 11/05/2016 0936   TRIG 81 11/05/2016 0936   HDL 39 (L) 11/05/2016 0936   CHOLHDL 3.1 11/05/2016 0936   LDLCALC 65 11/05/2016 0936    Additional studies/ records that were reviewed today include:  TAVR OPERATIVE NOTE   Date of Procedure:                07/02/2016  Preoperative Diagnosis:      Severe Aortic Stenosis   Postoperative Diagnosis:    Same   Procedure:        Transcatheter Aortic Valve Replacement - Percutaneous  Transfemoral Approach             Edwards Sapien 3 THV (size 29 mm, model # 9600TFX, serial # [id number])              Co-Surgeons:  Valentina Gu. Roxy Manns, MD and [name], MD   Anesthesiologist:                  Suann Larry, MD  Echocardiographer:              Loralie Champagne, MD  Pre-operative Echo Findings: ? Severe aortic stenosis ? Normal left ventricular systolic function ? Moderate pericardial effusion  Post-operative Echo Findings: ? Trace paravalvular leak ? Normal left ventricular systolic function ? No change in effusion  ________________   2D ECHO 07/28/17 ( 1 year s/p TAVR) Study Conclusions -  Left ventricle: The cavity size was normal. Wall thickness was   increased in a pattern of moderate LVH. Systolic function was   normal. The estimated ejection fraction was in the range of 60%   to 65%. Wall motion was normal; there were no regional wall   motion abnormalities. - Aortic valve: A bioprosthesis was present. Valve area (VTI): 1.36   cm^2. Valve area (Vmax): 1.25 cm^2. Valve area (Vmean): 1.54   cm^2. - Left atrium: The atrium was moderately dilated. - Right atrium: The atrium was moderately to severely dilated. - Pulmonary arteries: Systolic pressure was mildly increased. PA   peak pressure: 34 mm Hg (S).   ASSESSMENT & PLAN:   Severe AS s/p TAVR: 2D ECHO 07/28/17 showed EF 60-65%, normally functioning TAVR valve with mean gradient of 13mm Hg and no PVL.  He has NYHA class II symptoms, mainly of fatigue. SBE prophylaxis discussed; he thinks he has Abx at home, but will check and call us if he doesn't and I will call in Amoxil.   CAD: recent cath showed patent RCA stent and 70% mLAD stenosis w/ negative FFR. Continue medical therapy. He is not on ASA given Eliquis use. Continue statin   Pericardial effusion: resolved by most recent echo.   PAF: he sounds regular on exam today. Continue Eliquis.   Medication Adjustments/Labs and Tests Ordered: Current medicines are reviewed at length with the patient today.  Concerns regarding medicines are outlined above.  Medication changes, Labs and Tests ordered today are listed in the Patient Instructions below. Patient Instructions  Medication Instructions:  Your physician recommends that you continue on your current medications as directed. Please refer to the Current Medication list given to you today.   Labwork: none  Testing/Procedures: none  Follow-Up: Follow up with Dr. Radford Pax as planned on October 3,2019  Any Other Special Instructions Will Be Listed Below (If Applicable).  Your physician discussed the importance of  taking an antibiotic prior to any dental, gastrointestinal, genitourinary procedures to prevent damage to the heart valves from infection. If you need a prescription call us and we will send to your pharmacy.  Phone number is [phone]      If you need a refill on your cardiac medications before your next appointment, please call your pharmacy.      Signed, Angelena Form, PA-C  08/06/2017 1:52 PM    [facility] [address], [place], [place]  [address] Phone: [phone]; Fax: [phone]

## 2017-08-06 ENCOUNTER — Encounter: Payer: Self-pay | Admitting: Physician Assistant

## 2017-08-06 ENCOUNTER — Ambulatory Visit: Payer: BC Managed Care – PPO | Admitting: Physician Assistant

## 2017-08-06 VITALS — BP 116/60 | HR 70 | Ht 69.0 in | Wt 122.0 lb

## 2017-08-06 DIAGNOSIS — I480 Paroxysmal atrial fibrillation: Secondary | ICD-10-CM | POA: Diagnosis not present

## 2017-08-06 DIAGNOSIS — Z952 Presence of prosthetic heart valve: Secondary | ICD-10-CM | POA: Diagnosis not present

## 2017-08-06 DIAGNOSIS — I2510 Atherosclerotic heart disease of native coronary artery without angina pectoris: Secondary | ICD-10-CM | POA: Diagnosis not present

## 2017-08-06 DIAGNOSIS — I313 Pericardial effusion (noninflammatory): Secondary | ICD-10-CM

## 2017-08-06 DIAGNOSIS — I3139 Other pericardial effusion (noninflammatory): Secondary | ICD-10-CM

## 2017-08-06 NOTE — Patient Instructions (Signed)
Medication Instructions:  Your physician recommends that you continue on your current medications as directed. Please refer to the Current Medication list given to you today.   Labwork: none  Testing/Procedures: none  Follow-Up: Follow up with Dr. Radford Pax as planned on October 3,2019  Any Other Special Instructions Will Be Listed Below (If Applicable).  Your physician discussed the importance of taking an antibiotic prior to any dental, gastrointestinal, genitourinary procedures to prevent damage to the heart valves from infection. If you need a prescription call us and we will send to your pharmacy.  Phone number is [phone]      If you need a refill on your cardiac medications before your next appointment, please call your pharmacy.

## 2017-08-07 ENCOUNTER — Other Ambulatory Visit: Payer: Self-pay | Admitting: Cardiovascular Disease

## 2017-08-07 NOTE — Telephone Encounter (Signed)
Ok to fill 

## 2017-08-07 NOTE — Telephone Encounter (Signed)
Pt's pharmacy is requesting a refill on Amoxicillin for a dental procedure. Please address

## 2017-08-07 NOTE — Telephone Encounter (Signed)
Ok to refill Amoxicillin

## 2017-08-18 ENCOUNTER — Encounter: Payer: Self-pay | Admitting: Thoracic Surgery (Cardiothoracic Vascular Surgery)

## 2017-08-19 ENCOUNTER — Other Ambulatory Visit: Payer: Self-pay | Admitting: Cardiology

## 2017-08-19 DIAGNOSIS — J3089 Other allergic rhinitis: Secondary | ICD-10-CM | POA: Diagnosis not present

## 2017-08-19 DIAGNOSIS — J3081 Allergic rhinitis due to animal (cat) (dog) hair and dander: Secondary | ICD-10-CM | POA: Diagnosis not present

## 2017-08-19 DIAGNOSIS — J301 Allergic rhinitis due to pollen: Secondary | ICD-10-CM | POA: Diagnosis not present

## 2017-08-19 MED ORDER — COLCHICINE 0.6 MG PO TABS: 0.6000 mg | ORAL_TABLET | Freq: Two times a day (BID) | ORAL | 3 refills | Status: DC

## 2017-08-19 NOTE — Telephone Encounter (Signed)
Pt's pharmacy is requesting a refill on colchicine. Would Dr. Turner like to refill this medication? Please address 

## 2017-08-19 NOTE — Telephone Encounter (Signed)
Per Dr Theodosia Blender last OV note, pt was to remain on Colchicine 0.6mg  tablet.

## 2017-08-28 DIAGNOSIS — Z471 Aftercare following joint replacement surgery: Secondary | ICD-10-CM | POA: Diagnosis not present

## 2017-08-28 DIAGNOSIS — Z96642 Presence of left artificial hip joint: Secondary | ICD-10-CM | POA: Diagnosis not present

## 2017-09-08 DIAGNOSIS — M25552 Pain in left hip: Secondary | ICD-10-CM | POA: Diagnosis not present

## 2017-09-11 DIAGNOSIS — M25552 Pain in left hip: Secondary | ICD-10-CM | POA: Diagnosis not present

## 2017-09-16 DIAGNOSIS — J301 Allergic rhinitis due to pollen: Secondary | ICD-10-CM | POA: Diagnosis not present

## 2017-09-16 DIAGNOSIS — J3089 Other allergic rhinitis: Secondary | ICD-10-CM | POA: Diagnosis not present

## 2017-09-16 DIAGNOSIS — J3081 Allergic rhinitis due to animal (cat) (dog) hair and dander: Secondary | ICD-10-CM | POA: Diagnosis not present

## 2017-09-18 DIAGNOSIS — M25552 Pain in left hip: Secondary | ICD-10-CM | POA: Diagnosis not present

## 2017-09-19 ENCOUNTER — Encounter: Payer: Self-pay | Admitting: Internal Medicine

## 2017-09-19 ENCOUNTER — Ambulatory Visit (INDEPENDENT_AMBULATORY_CARE_PROVIDER_SITE_OTHER): Payer: Medicare Other | Admitting: Internal Medicine

## 2017-09-19 VITALS — BP 100/62 | HR 76 | Ht 69.5 in | Wt 122.2 lb

## 2017-09-19 DIAGNOSIS — J90 Pleural effusion, not elsewhere classified: Secondary | ICD-10-CM

## 2017-09-19 DIAGNOSIS — R634 Abnormal weight loss: Secondary | ICD-10-CM

## 2017-09-19 DIAGNOSIS — R0609 Other forms of dyspnea: Secondary | ICD-10-CM

## 2017-09-19 DIAGNOSIS — R5382 Chronic fatigue, unspecified: Secondary | ICD-10-CM

## 2017-09-19 DIAGNOSIS — I2510 Atherosclerotic heart disease of native coronary artery without angina pectoris: Secondary | ICD-10-CM | POA: Diagnosis not present

## 2017-09-19 DIAGNOSIS — Z8709 Personal history of other diseases of the respiratory system: Secondary | ICD-10-CM

## 2017-09-19 DIAGNOSIS — R5381 Other malaise: Secondary | ICD-10-CM | POA: Diagnosis not present

## 2017-09-19 NOTE — Patient Instructions (Addendum)
Chronic fatigue Weight loss, non-intentional Dyspnea on exertion Physical deconditioning History of pleural effusion  -repeat HRCT next few to several week = check CK again, ESR again and acetylcholine receptor antibody  Followup - based on CT scan decide next step that migh involve neuro referral or bike pulmonary stress test

## 2017-09-19 NOTE — Progress Notes (Signed)
HPI  IOV 07/31/2017   Chief Complaint  Patient presents with  . Consult    Referred by Dr. Radford Pax due to pericardial effusion.  Pt has had a cardiac cath performed 5/24 and also a thoracentesis performed 5/15. Pt states he has a lot of phlegm that is white in color.    [name] , 82 y.o. , with dob 12-Apr-1935 and male ,Not Hispanic or Latino from [place] [address] - presents to lung  clinic for evaluation of pleuropericardial effusion associated with weight loss.history is gained from talking to him and review of the referring physician notes and old chart. It is a very complicated complex history. As best as I can tell in May 2018 he underwent coronary artery stent RCA and in June 2018 and underwent TAVR rom severe aortic stenosis. He was known to have had chronic pericardial effusion even at the time off TAVR in June 2018. He tells me starting fall 2018he started noticing weight loss and cough and was? Admitted for pneumonia. After that he lost30-40 pounds. These problems persisted through the spring of 2019 when they settled. In the midst of this in February 2019 he underwent a CT scan of the chestthat I personally visualized and shows groundglass opacities with lung nodules but no pleural effusion at this time. He had a follow-up CT chest in May 2019 at which time the cough had resolved. Concomitant with the resolution of the cough is lung nodules and ground glass opacities have resolved but he had new onset bilateral pleural effusion. He then had left thoracentesis which she says was 1 L middle May 2019which I review the results and shows a transudate. He had a follow-up chest x-ray that showed resolution of the  Pleural effusion. After that he has not had any further radiologic imaging of the chest. 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 He underwent  echocardiogram a few days ago July 2019 as follow-up and the pericardial effusion has resolved. Since May 2019 he does not have the cough the weight loss has stabilized. However he has not been able to gain weight. He is now able to work 20 minutes in the yard but then after that feels extremely exhausted that he has to go to the bedhe is not happy with this outcome he wants to be able to do more but he does admit that this is better than 8 months ago and even a few months ago.He has been referred here because of the constellation of weight loss in the pericardial and pleural effusions.  He denies any other stigmata of autoimmune vasculitis disease including malar rash or photophobia or oral ulcers. He does have easy bruising but he is on antiplatelet therapy   Results for EDUIN, FRIEDEL (MRN [id number]) as of 07/31/2017 09:25  Ref. Range 05/08/2017 10:55  Anti Nuclear Antibody(ANA) Latest Ref Range: Negative  Negative  RA Latex Turbid. Latest Ref Range: 0.0 - 13.9 IU/mL <10.0   Most recent blood labs in 11.3 g percent May 2019, sedimentation rate 58 and April 2019nd creatinine 0.9 mg percent June 2019     OV 09/19/2017  Subjective:  Patient ID: [name], male , DOB: 10-May-1935 , age 82 y.o. , MRN: [id number] , ADDRESS: [id number] [place] [address]   09/19/2017 -   Chief Complaint  Patient presents with  . Follow-up  breathing still the same, cough with white/gray color      HPI HORTON ELLITHORPE 82 y.o. -followup for unusual constellation of    ICD-10-CM   1. Chronic fatigue R53.82   2. Weight loss, non-intentional R63.4   3. Dyspnea on exertion R06.09   4. Physical deconditioning R53.81   5. History of pleural effusion Z87.09   6. Hx peridcardial effsion  - He says his last follow-up visit weight loss. But he has not gained any weight. He works with physical therapy and he does bike exercise and he stopped because of proximal muscle cramps and pain but his CK was  normal in July 2029 he says he lost all his weight after the pneumonia including mostly muscle mass. He says he is trying to regain it but not able to. There no history of muscular fasciculations. He also get short of breath although I'm unable to differentiate if this is fatigue or shortness of breath particularly with exertion. Chest x-ray July 2019 did not show any obvious recurrence of pericardial or pleural effusion.His last CT chest was in February 2019       Simple office walk 185 feet x  3 laps goal with forehead probe 07/31/2017   O2 used Room air  Number laps completed 3  Comments about pace normal  Resting Pulse Ox/HR 97% and 69/min  Final Pulse Ox/HR 100% and 64/min  Desaturated </= 88% no  Desaturated <= 3% points no  Got Tachycardic >/= 90/min no  Symptoms at end of test Not reported  Miscellaneous comments none     ROS - per HPI     has a past medical history of Actinic keratoses, Allergic rhinitis, Aortic stenosis, Arthritis, Arthritis of left hip (11/03/2014), Benign essential HTN, Bicuspid aortic valve, Bilateral cataracts, 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), BPH (benign prostatic hypertrophy), Coronary artery disease involving native coronary artery of native heart without angina pectoris, Dilated aortic root ([facility]), Glaucoma, H/O seasonal allergies, adenomatous colonic polyps (1991 on), cardiovascular stress test, Hyperlipidemia, Mitral valve prolapse, Multifocal pneumonia (12/01/2016), New onset atrial fibrillation ([facility]) (04/23/2017), OA (osteoarthritis) of hip (10/16/2016), PAF (paroxysmal atrial fibrillation) ([facility]), Paresthesia, Pericardial effusion (11/05/2016), Postural dizziness with presyncope (04/23/2017), Pulmonary nodules, Right heart failure ([facility]), and S/P TAVR (transcatheter aortic valve replacement).   reports that he quit smoking about 57 years ago. His smoking use included cigarettes. He has a 1.00 pack-year smoking history. He has never used smokeless  tobacco.  Past Surgical History:  Procedure Laterality Date  . COLONOSCOPY  multiple  . CORONARY STENT INTERVENTION N/A 06/06/2016   Procedure: Coronary Stent Intervention;  Surgeon: [name], MD;  Location: [facility];  Service: Cardiovascular;  Laterality: N/A;  . CYSTOSCOPY  08/12/2016   per patient; showed a cyst on bladder , liver , and kidney , sees urologist [facility]ilde [facility] at [facility]  . EYE SURGERY     Bilateral cataract removal  . INTRAVASCULAR PRESSURE WIRE/FFR STUDY N/A 05/29/2016   Procedure: Intravascular Pressure Wire/FFR Study;  Surgeon: [name], MD;  Location: [facility];  Service: Cardiovascular;  Laterality: N/A;  . IR THORACENTESIS ASP PLEURAL SPACE W/IMG GUIDE  05/28/2017  . RIGHT/LEFT HEART CATH AND CORONARY ANGIOGRAPHY N/A 05/29/2016   Procedure: Right/Left Heart Cath and Coronary Angiography;  Surgeon: [name], MD;  Location: [facility];  Service: Cardiovascular;  Laterality: N/A;  . RIGHT/LEFT HEART CATH AND CORONARY ANGIOGRAPHY N/A 06/06/2017   Procedure: RIGHT/LEFT HEART CATH AND CORONARY ANGIOGRAPHY;  Surgeon: Aundra Dubin,  Elby Showers, MD;  Location: [facility];  Service: Cardiovascular;  Laterality: N/A;  . TEE WITHOUT CARDIOVERSION N/A 07/02/2016   Procedure: TRANSESOPHAGEAL ECHOCARDIOGRAM (TEE);  Surgeon: [name], MD;  Location: [facility];  Service: Open Heart Surgery;  Laterality: N/A;  . TONSILLECTOMY  1949  . TOTAL HIP ARTHROPLASTY Left 10/16/2016   Procedure: LEFT TOTAL HIP ARTHROPLASTY ANTERIOR APPROACH;  Surgeon: Gaynelle Arabian, MD;  Location: [facility];  Service: Orthopedics;  Laterality: Left;  . TRANSCATHETER AORTIC VALVE REPLACEMENT, TRANSFEMORAL N/A 07/02/2016   Procedure: TRANSCATHETER AORTIC VALVE REPLACEMENT, TRANSFEMORAL;  Surgeon: [name], MD;  Location: [facility];  Service: Open Heart Surgery;  Laterality: N/A;    No Known Allergies  Immunization History  Administered Date(s) Administered  .  Influenza, High Dose Seasonal PF 10/17/2016    Family History  Problem Relation Age of Onset  . Emphysema Mother   . Pancreatic cancer Father   . Heart attack Brother   . Heart disease Brother   . Colon cancer Neg Hx   . Stomach cancer Neg Hx      Current Outpatient Medications:  .  amoxicillin (AMOXIL) 500 MG capsule, TAKE 4 CAPSULES ONE HOUR PRIOR TO DENTAL PROCEDURE., Disp: 8 capsule, Rfl: 0 .  apixaban (ELIQUIS) 5 MG TABS tablet, Take 1 tablet (5 mg total) by mouth 2 (two) times daily., Disp: 180 tablet, Rfl: 3 .  colchicine 0.6 MG tablet, Take 1 tablet (0.6 mg total) by mouth 2 (two) times daily., Disp: 180 tablet, Rfl: 3 .  feeding supplement, ENSURE ENLIVE, (ENSURE ENLIVE) LIQD, Take 237 mLs by mouth daily., Disp: 30 Bottle, Rfl: 0 .  fluticasone (FLONASE) 50 MCG/ACT nasal spray, Place 1 spray into both nostrils daily., Disp: , Rfl:  .  furosemide (LASIX) 20 MG tablet, Take 2 tablets (40 mg total) by mouth 2 (two) times daily., Disp: 120 tablet, Rfl: 6 .  latanoprost (XALATAN) 0.005 % ophthalmic solution, Place 1 drop into both eyes at bedtime., Disp: , Rfl:  .  pravastatin (PRAVACHOL) 40 MG tablet, Take 40 mg by mouth at bedtime. , Disp: , Rfl:  .  timolol (BETIMOL) 0.5 % ophthalmic solution, Place 1 drop into both eyes 2 (two) times daily., Disp: , Rfl:       Objective:   Vitals:   09/19/17 1038  BP: 100/62  Pulse: 76  SpO2: 98%  Weight: 122 lb 3.2 oz (55.4 kg)  Height: 5' 9.5" (1.765 m)    Estimated body mass index is 17.79 kg/m as calculated from the following:   Height as of this encounter: 5' 9.5" (1.765 m).   Weight as of this encounter: 122 lb 3.2 oz (55.4 kg).  @WEIGHTCHANGE @  [employer]   09/19/17 1038  Weight: 122 lb 3.2 oz (55.4 kg)     Physical Exam  General Appearance:    Alert, cooperative, no distress, frail male , sitting on - char  Head:    Normocephalic, without obvious abnormality, atraumatic  Eyes:    PERRL, conjunctiva/corneas  clear,  Ears:    Normal TM's and external ear canals, both ears  Nose:   Nares normal, septum midline, mucosa normal, no drainage    or sinus tenderness. OXYGEN ON  - no . Patient is @ no   Throat:   Lips, mucosa, and tongue normal; teeth and gums normal. Cyanosis on lips - no  Neck:   Supple, symmetrical, trachea midline, no adenopathy;    thyroid:  no enlargement/tenderness/nodules; no carotid  bruit or JVD  Back:     Symmetric, no curvature, ROM normal, no CVA tenderness  Lungs:     Distress - no , Wheeze no, Barrell Chest - no, Purse lip breathing - no, Crackles - no   Chest Wall:    No tenderness or deformity.    Heart:    Regular rate and rhythm, S1 and S2 normal, no murmur, rub   or gallop  Breast Exam:    NOT DONE  Abdomen:     Soft, non-tender, bowel sounds active all four quadrants,    no masses, no organomegaly  Genitalia:   NOT DONE  Rectal:   NOT DONE  Extremities:   Extremities normal, atraumatic, Clubbing - no, Edema - no  Pulses:   2+ and symmetric all extremities  Skin:   Stigmata of Connective Tissue Disease - no  Lymph nodes:   Cervical, supraclavicular, and axillary nodes normal  Psychiatric:  Neurologic:   pleaant CNII-XII intact, normal strength, sensation  Throughout Diffuse muscle wasting +. No fascicultions. No focal weakness           Assessment:       ICD-10-CM   1. Chronic fatigue R53.82   2. Weight loss, non-intentional R63.4   3. Dyspnea on exertion R06.09   4. Physical deconditioning R53.81   5. History of pleural effusion Z87.09        Plan:     Chronic fatigue Weight loss, non-intentional Dyspnea on exertion Physical deconditioning History of pleural effusion  -repeat HRCT next few to several week = check CK again, ESR again and acetylcholine receptor antibody  Followup - based on CT scan decide next step that migh involve neuro referral or bike pulmonary stress test          SIGNATURE    Dr. Brand Males, M.D.,  F.C.C.P,  Pulmonary and Critical Care Medicine Staff Physician, [place] Director - Interstitial Lung Disease  Program  [facility] at [place], [place], [address]  Pager: [phone], If no answer or between  15:00h - 7:00h: call [phone] Telephone: [phone]  11:22 AM 09/19/2017

## 2017-09-23 DIAGNOSIS — M25552 Pain in left hip: Secondary | ICD-10-CM | POA: Diagnosis not present

## 2017-09-25 DIAGNOSIS — M25552 Pain in left hip: Secondary | ICD-10-CM | POA: Diagnosis not present

## 2017-09-29 ENCOUNTER — Ambulatory Visit (INDEPENDENT_AMBULATORY_CARE_PROVIDER_SITE_OTHER)
Admission: RE | Admit: 2017-09-29 | Discharge: 2017-09-29 | Disposition: A | Discharge status: Home / Self Care | Payer: Medicare Other | Source: Ambulatory Visit | Attending: Internal Medicine | Admitting: Internal Medicine

## 2017-09-29 DIAGNOSIS — Z8709 Personal history of other diseases of the respiratory system: Secondary | ICD-10-CM

## 2017-09-29 DIAGNOSIS — R0609 Other forms of dyspnea: Secondary | ICD-10-CM | POA: Diagnosis not present

## 2017-09-29 DIAGNOSIS — R5381 Other malaise: Secondary | ICD-10-CM

## 2017-09-29 DIAGNOSIS — R5382 Chronic fatigue, unspecified: Secondary | ICD-10-CM

## 2017-09-29 DIAGNOSIS — R918 Other nonspecific abnormal finding of lung field: Secondary | ICD-10-CM | POA: Diagnosis not present

## 2017-09-29 DIAGNOSIS — R634 Abnormal weight loss: Secondary | ICD-10-CM

## 2017-09-30 DIAGNOSIS — M25552 Pain in left hip: Secondary | ICD-10-CM | POA: Diagnosis not present

## 2017-10-02 DIAGNOSIS — M25552 Pain in left hip: Secondary | ICD-10-CM | POA: Diagnosis not present

## 2017-10-09 DIAGNOSIS — Z96642 Presence of left artificial hip joint: Secondary | ICD-10-CM | POA: Diagnosis not present

## 2017-10-10 ENCOUNTER — Other Ambulatory Visit (INDEPENDENT_AMBULATORY_CARE_PROVIDER_SITE_OTHER): Payer: Medicare Other

## 2017-10-10 ENCOUNTER — Ambulatory Visit (INDEPENDENT_AMBULATORY_CARE_PROVIDER_SITE_OTHER): Payer: Medicare Other | Admitting: Internal Medicine

## 2017-10-10 ENCOUNTER — Encounter: Payer: Self-pay | Admitting: Internal Medicine

## 2017-10-10 VITALS — BP 116/64 | HR 68 | Ht 69.5 in | Wt 120.4 lb

## 2017-10-10 DIAGNOSIS — R634 Abnormal weight loss: Secondary | ICD-10-CM

## 2017-10-10 DIAGNOSIS — R053 Chronic cough: Secondary | ICD-10-CM

## 2017-10-10 DIAGNOSIS — R5382 Chronic fatigue, unspecified: Secondary | ICD-10-CM

## 2017-10-10 DIAGNOSIS — J45991 Cough variant asthma: Secondary | ICD-10-CM

## 2017-10-10 DIAGNOSIS — R5381 Other malaise: Secondary | ICD-10-CM

## 2017-10-10 DIAGNOSIS — R05 Cough: Secondary | ICD-10-CM | POA: Diagnosis not present

## 2017-10-10 DIAGNOSIS — I2510 Atherosclerotic heart disease of native coronary artery without angina pectoris: Secondary | ICD-10-CM

## 2017-10-10 DIAGNOSIS — R0609 Other forms of dyspnea: Secondary | ICD-10-CM | POA: Diagnosis not present

## 2017-10-10 LAB — CBC WITH DIFFERENTIAL/PLATELET
BASOS ABS: 0 10*3/uL (ref 0.0–0.1)
BASOS PCT: 0.3 % (ref 0.0–3.0)
EOS ABS: 0.9 10*3/uL — AB (ref 0.0–0.7)
Eosinophils Relative: 10 % — ABNORMAL HIGH (ref 0.0–5.0)
HCT: 44.7 % (ref 39.0–52.0)
Hemoglobin: 14.9 g/dL (ref 13.0–17.0)
LYMPHS ABS: 1.5 10*3/uL (ref 0.7–4.0)
LYMPHS PCT: 16.8 % (ref 12.0–46.0)
MCHC: 33.3 g/dL (ref 30.0–36.0)
MCV: 88.7 fl (ref 78.0–100.0)
MONO ABS: 0.9 10*3/uL (ref 0.1–1.0)
Monocytes Relative: 9.9 % (ref 3.0–12.0)
NEUTROS ABS: 5.5 10*3/uL (ref 1.4–7.7)
NEUTROS PCT: 63 % (ref 43.0–77.0)
PLATELETS: 214 10*3/uL (ref 150.0–400.0)
RBC: 5.04 Mil/uL (ref 4.22–5.81)
RDW: 18.2 % — ABNORMAL HIGH (ref 11.5–15.5)
WBC: 8.7 10*3/uL (ref 4.0–10.5)

## 2017-10-10 LAB — NITRIC OXIDE: Nitric Oxide: 101

## 2017-10-10 LAB — CK: Total CK: 72 U/L (ref 7–232)

## 2017-10-10 LAB — SEDIMENTATION RATE: Sed Rate: 60 mm/hr — ABNORMAL HIGH (ref 0–20)

## 2017-10-10 MED ORDER — PREDNISONE 10 MG PO TABS: ORAL_TABLET | ORAL | 0 refills | Status: DC

## 2017-10-10 MED ORDER — FLUTICASONE FUROATE-VILANTEROL 100-25 MCG/INH IN AEPB: 1.0000 | INHALATION_SPRAY | Freq: Every day | RESPIRATORY_TRACT | 0 refills | Status: DC

## 2017-10-10 MED ORDER — FLUTICASONE FUROATE-VILANTEROL 100-25 MCG/INH IN AEPB: 1.0000 | INHALATION_SPRAY | Freq: Every day | RESPIRATORY_TRACT | 11 refills | Status: DC

## 2017-10-10 MED ORDER — ALBUTEROL SULFATE HFA 108 (90 BASE) MCG/ACT IN AERS: 2.0000 | INHALATION_SPRAY | Freq: Four times a day (QID) | RESPIRATORY_TRACT | 6 refills | Status: DC | PRN

## 2017-10-10 NOTE — Progress Notes (Signed)
Patient seen in the office today and instructed on use of breo 100.  Patient expressed understanding and demonstrated technique.

## 2017-10-10 NOTE — Progress Notes (Signed)
IOV 07/31/2017   Chief Complaint  Patient presents with  . Consult    Referred by Dr. Radford Pax due to pericardial effusion.  Pt has had a cardiac cath performed 5/24 and also a thoracentesis performed 5/15. Pt states he has a lot of phlegm that is white in color.    [name] [name] , 82 y.o. , with dob Jun 13, 1935 and male ,Not Hispanic or Latino from [place] [address] - presents to lung  clinic for evaluation of pleuropericardial effusion associated with weight loss.history is gained from talking to him and review of the referring physician notes and old chart. It is a very complicated complex history. As best as I can tell in May 2018 he underwent coronary artery stent RCA and in June 2018 and underwent TAVR rom severe aortic stenosis. He was known to have had chronic pericardial effusion even at the time off TAVR in June 2018. He tells me starting fall 2018he started noticing weight loss and cough and was? Admitted for pneumonia. After that he lost30-40 pounds. These problems persisted through the spring of 2019 when they settled. In the midst of this in February 2019 he underwent a CT scan of the chestthat I personally visualized and shows groundglass opacities with lung nodules but no pleural effusion at this time. He had a follow-up CT chest in May 2019 at which time the cough had resolved. Concomitant with the resolution of the cough is lung nodules and ground glass opacities have resolved but he had new onset bilateral pleural effusion. He then had left thoracentesis which she says was 1 L middle May 2019which I review the results and shows a transudate. He had a follow-up chest x-ray that showed resolution of the  Pleural effusion. After that he has not had any further radiologic imaging of the chest. 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 He underwent  echocardiogram a few days ago July 2019 as follow-up and the pericardial effusion has resolved. Since May 2019 he does not have the cough the weight loss has stabilized. However he has not been able to gain weight. He is now able to work 20 minutes in the yard but then after that feels extremely exhausted that he has to go to the bedhe is not happy with this outcome he wants to be able to do more but he does admit that this is better than 8 months ago and even a few months ago.He has been referred here because of the constellation of weight loss in the pericardial and pleural effusions.  He denies any other stigmata of autoimmune vasculitis disease including malar rash or photophobia or oral ulcers. He does have easy bruising but he is on antiplatelet therapy   Results for [name] [name] (MRN [id number]) as of 07/31/2017 09:25  Ref. Range 05/08/2017 10:55  Anti Nuclear Antibody(ANA) Latest Ref Range: Negative  Negative  RA Latex Turbid. Latest Ref Range: 0.0 - 13.9 IU/mL <10.0   Most recent blood labs in 11.3 g percent May 2019, sedimentation rate 58 and April 2019nd creatinine 0.9 mg percent June 2019     OV 09/19/2017  Subjective:  Patient ID: [name] [name], male , DOB: November 22, 1935 , age 26 y.o. , MRN: [id number] , ADDRESS: [address] [name] [name] [address]   09/19/2017 -   Chief Complaint  Patient presents with  . Follow-up  breathing still the same, cough with white/gray color      HPI [name] [name] 82 y.o. -followup for unusual constellation of    ICD-10-CM   1. Chronic fatigue R53.82   2. Weight loss, non-intentional R63.4   3. Dyspnea on exertion R06.09   4. Physical deconditioning R53.81   5. History of pleural effusion Z87.09   6. Hx peridcardial effsion  - He says his last follow-up visit weight loss. But he has not gained any weight. He works with physical therapy and he does bike exercise and he stopped because of proximal muscle cramps and pain but his CK was  normal in July 2029 he says he lost all his weight after the pneumonia including mostly muscle mass. He says he is trying to regain it but not able to. There no history of muscular fasciculations. He also get short of breath although I'm unable to differentiate if this is fatigue or shortness of breath particularly with exertion. Chest x-ray July 2019 did not show any obvious recurrence of pericardial or pleural effusion.His last CT chest was in February 2019       Simple office walk 185 feet x  3 laps goal with forehead probe 07/31/2017   O2 used Room air  Number laps completed 3  Comments about pace normal  Resting Pulse Ox/HR 97% and 69/min  Final Pulse Ox/HR 100% and 64/min  Desaturated </= 88% no  Desaturated <= 3% points no  Got Tachycardic >/= 90/min no  Symptoms at end of test Not reported  Miscellaneous comments none     ROS OV 10/10/2017  Subjective:  Patient ID: [name] [name], male , DOB: [birth year] , age 45 y.o. , MRN: [id number] , ADDRESS: [address] [place] [address]   10/10/2017 -   Chief Complaint  Patient presents with  . Follow-up    HRCT performed 9/16.  Pt states he feels like he has become worse since last visit. States the cough has become worse and he still has SOB. Pt wonders if the pna is coming back.      HPI [name] [name] 82 y.o. -presents for follow-up of his symptoms of constellation of weight loss physical deconditioning shortness of breath and cough.  After last visit we had him undergo a high-resolution CT chest that he did September 29, 2017.  Previous issues of pericardial effusion or thickening and pleural effusion have resolved on the CT chest that I personally visualized.  His lung parenchyma looks pretty healthy.  There is no evidence of interstitial lung disease.  There might be some mild bronchiectasis and some small nodules per radiology.  None of this explain his symptomatology.  He tells me he continues to have shortness of  breath with exertion although this could also be fatigue and deconditioning.     He also reports a chronic cough for the last several months.  He is reporting this to me for the first time.  Therefore it is a new complaint.  Happens mostly at night and when he lies down.  He has some white sputum.  He thinks it is sinus at drainage and allergy related.  He has been advised to do sinus cleansing by his allergist.  He is not on inhalers.  We also did autoimmune and vasculitis panel and it was negative.  This documented below.  His CK in June 2019 was normal.  His exhaled nitric oxide -today in our office- significantly elevated at 101 ppb  Results for ALEKSEI, GOODLIN (MRN [id number]) as of 10/10/2017 10:43  Ref. Range 07/31/2017 10:07  Anti Nuclear Antibody(ANA) Latest Ref Range: NEGATIVE  NEGATIVE  ANCA Proteinase 3 Latest Ref Range: 0.0 - 3.5 U/mL <3.5  Angiotensin-Converting Enzyme Latest Ref Range: 9 - 67 U/L 24  Cyclic Citrullin Peptide Ab Latest Units: UNITS <16  ds DNA Ab Latest Units: IU/mL <1  Myeloperoxidase Ab Latest Ref Range: 0.0 - 9.0 U/mL <9.0  RA Latex Turbid. 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), BPH (benign prostatic hypertrophy), Coronary artery disease involving native coronary artery of native heart without angina pectoris, Dilated aortic root (HCC), Glaucoma, H/O seasonal allergies, adenomatous colonic polyps (1991 on), cardiovascular  stress test, Hyperlipidemia, Mitral valve prolapse, Multifocal pneumonia (12/01/2016), New onset atrial fibrillation (HCC) (04/23/2017), OA (osteoarthritis) of hip (10/16/2016), PAF (paroxysmal atrial fibrillation) (HCC), Paresthesia, Pericardial effusion (11/05/2016), Postural dizziness with presyncope (04/23/2017), Pulmonary nodules, Right heart failure (HCC), and S/P TAVR (transcatheter aortic valve replacement).   reports that he quit smoking about 57 years ago. His smoking use included cigarettes. He has a 1.00 pack-year smoking history. He has never used smokeless tobacco.  Past Surgical History:  Procedure Laterality Date  . COLONOSCOPY  multiple  . CORONARY STENT INTERVENTION N/A 06/06/2016   Procedure: Coronary Stent Intervention;  Surgeon: [name], MD;  Location: [facility];  Service: Cardiovascular;  Laterality: N/A;  . CYSTOSCOPY  08/12/2016   per patient; showed a cyst on bladder , liver , and kidney , sees urologist Matilde Bash III at [facility]  . EYE SURGERY     Bilateral cataract removal  . INTRAVASCULAR PRESSURE WIRE/FFR STUDY N/A 05/29/2016   Procedure: Intravascular Pressure Wire/FFR Study;  Surgeon: [name], MD;  Location: [facility];  Service: Cardiovascular;  Laterality: N/A;  . IR THORACENTESIS ASP PLEURAL SPACE W/IMG GUIDE  05/28/2017  . RIGHT/LEFT HEART CATH AND CORONARY ANGIOGRAPHY N/A 05/29/2016   Procedure: Right/Left Heart Cath and Coronary Angiography;  Surgeon: [name], MD;  Location: [facility];  Service: Cardiovascular;  Laterality: N/A;  . RIGHT/LEFT HEART CATH AND CORONARY ANGIOGRAPHY N/A 06/06/2017   Procedure: RIGHT/LEFT HEART CATH AND CORONARY ANGIOGRAPHY;  Surgeon: Larey Dresser, MD;  Location: [facility];  Service: Cardiovascular;  Laterality: N/A;  . TEE WITHOUT CARDIOVERSION N/A 07/02/2016   Procedure: TRANSESOPHAGEAL ECHOCARDIOGRAM (TEE);  Surgeon: [name], MD;  Location: [facility];  Service: Open  Heart Surgery;  Laterality: N/A;  . TONSILLECTOMY  1949  . TOTAL HIP ARTHROPLASTY Left 10/16/2016   Procedure: LEFT TOTAL HIP ARTHROPLASTY ANTERIOR APPROACH;  Surgeon: Gaynelle Arabian, MD;  Location: [facility];  Service: Orthopedics;  Laterality: Left;  . TRANSCATHETER AORTIC VALVE REPLACEMENT, TRANSFEMORAL N/A 07/02/2016   Procedure: TRANSCATHETER AORTIC VALVE REPLACEMENT, TRANSFEMORAL;  Surgeon: [name], MD;  Location: [facility];  Service: Open Heart Surgery;  Laterality: N/A;    No Known Allergies  Immunization History  Administered Date(s)  Administered  . Influenza, High Dose Seasonal PF 10/17/2016    Family History  Problem Relation Age of Onset  . Emphysema Mother   . Pancreatic cancer Father   . Heart attack Brother   . Heart disease Brother   . Colon cancer Neg Hx   . Stomach cancer Neg Hx      Current Outpatient Medications:  .  apixaban (ELIQUIS) 5 MG TABS tablet, Take 1 tablet (5 mg total) by mouth 2 (two) times daily., Disp: 180 tablet, Rfl: 3 .  colchicine 0.6 MG tablet, Take 1 tablet (0.6 mg total) by mouth 2 (two) times daily., Disp: 180 tablet, Rfl: 3 .  feeding supplement, ENSURE ENLIVE, (ENSURE ENLIVE) LIQD, Take 237 mLs by mouth daily., Disp: 30 Bottle, Rfl: 0 .  furosemide (LASIX) 20 MG tablet, Take 2 tablets (40 mg total) by mouth 2 (two) times daily., Disp: 120 tablet, Rfl: 6 .  latanoprost (XALATAN) 0.005 % ophthalmic solution, Place 1 drop into both eyes at bedtime., Disp: , Rfl:  .  pravastatin (PRAVACHOL) 40 MG tablet, Take 40 mg by mouth at bedtime. , Disp: , Rfl:  .  timolol (BETIMOL) 0.5 % ophthalmic solution, Place 1 drop into both eyes 2 (two) times daily., Disp: , Rfl:  .  amoxicillin (AMOXIL) 500 MG capsule, TAKE 4 CAPSULES ONE HOUR PRIOR TO DENTAL PROCEDURE. (Patient not taking: Reported on 10/10/2017), Disp: 8 capsule, Rfl: 0 .  fluticasone (FLONASE) 50 MCG/ACT nasal spray, Place 1 spray into both nostrils daily., Disp: , Rfl:       Objective:    Vitals:   10/10/17 1013  BP: 116/64  Pulse: 68  SpO2: 98%  Weight: 120 lb 6.4 oz (54.6 kg)  Height: 5' 9.5" (1.765 m)    Estimated body mass index is 17.53 kg/m as calculated from the following:   Height as of this encounter: 5' 9.5" (1.765 m).   Weight as of this encounter: 120 lb 6.4 oz (54.6 kg).  _0 @  Filed Weights   10/10/17 1013  Weight: 120 lb 6.4 oz (54.6 kg)     Physical Exam  General Appearance:    Alert, cooperative, no distress, appears stated age - older , Deconditioned looking - yes , OBESE  - no, lean, Sitting on Wheelchair -  no  Head:    Normocephalic, without obvious abnormality, atraumatic  Eyes:    PERRL, conjunctiva/corneas clear,  Ears:    Normal TM's and external ear canals, both ears  Nose:   Nares normal, septum midline, mucosa normal, no drainage    or sinus tenderness. OXYGEN ON  - no . Patient is @ ra   Throat:   Lips, mucosa, and tongue normal; teeth and gums normal. Cyanosis on lips - no  Neck:   Supple, symmetrical, trachea midline, no adenopathy;    thyroid:  no enlargement/tenderness/nodules; no carotid   bruit or JVD  Back:     Symmetric, no curvature, ROM normal, no CVA tenderness  Lungs:     Distress - no , Wheeze no, Barrell Chest - no, Purse lip breathing - no, Crackles - no   Chest Wall:    No tenderness or deformity.    Heart:    Regular rate and rhythm, S1 and S2 normal, no rub   or gallop, Murmur - no  Breast Exam:    NOT DONE  Abdomen:     Soft, non-tender, bowel sounds active all four quadrants,    no masses, no organomegaly. Visceral  obesity - no  Genitalia:   NOT DONE  Rectal:   NOT DONE  Extremities:   Extremities - normal, Has Cane - no, Clubbing - no, Edema - no  Pulses:   2+ and symmetric all extremities  Skin:   Stigmata of Connective Tissue Disease - no  Lymph nodes:   Cervical, supraclavicular, and axillary nodes normal  Psychiatric:  Neurologic:   Pleasant - yes, Anxious - no, Flat affect - yes   CAm-ICU - neg, Alert and Oriented x 3 - yes, Moves all 4s - yes, Speech - normal, Cognition - intact           Assessment:       ICD-10-CM   1. Chronic cough R05   2. Cough variant asthma J45.991   3. Chronic fatigue R53.82   4. Weight loss, non-intentional R63.4   5. Dyspnea on exertion R06.09   6. Physical deconditioning R53.81        Plan:     Patient Instructions     ICD-10-CM   1. Chronic cough R05   2. Cough variant asthma J45.991   3. Chronic fatigue R53.82   4. Weight loss, non-intentional R63.4   5. Dyspnea on exertion R06.09   6. Physical deconditioning R53.81    Chronic cough and cough variant asthma - new dx 10/10/2017 - you have cough variant asthma related to your allergies =- check blood IgE and CBC with diff 10/10/2017 - Please take prednisone 40 mg x1 day, then 30 mg x1 day, then 20 mg x1 day, then 10 mg x1 day, and then 5 mg x1 day and stop - start breo low dose daily - albuterol as needed - rechck feno in 4 weeks  Chronic fatigue Weight loss, non-intentional Dyspnea on exertion Physical deconditioning  -I still do not know what is contributing to the constellation of symptoms. --You do not have pleural effusion or pericardial effusion anymore -Autoimmune and vasculitis antibodies are negative. -There is no evidence of emphysema or pulmonary fibrosis - check ESR again, CK again, and AchRab blood test again 10/10/2017 - we will review this at followup - when we see how you are responding to asthma treatment - might consider rehab v CPST v neuro referral depending on results    Folllowup 4-6 weeks to re-eval above -      SIGNATURE    Dr. Brand Males, M.D., F.C.C.P,  Pulmonary and Critical Care Medicine Staff Physician, [place] Director - Interstitial Lung Disease  Program  Pulmonary [place] at [place], [place], [address]  Pager: [phone], If no answer or  between  15:00h - 7:00h: call [phone] Telephone: [phone]  11:18 AM 10/10/2017

## 2017-10-10 NOTE — Addendum Note (Signed)
Addended by: Lorretta Harp on: 10/10/2017 12:03 PM   Modules accepted: Orders

## 2017-10-10 NOTE — Addendum Note (Signed)
Addended by: Lorretta Harp on: 10/10/2017 11:23 AM   Modules accepted: Orders

## 2017-10-10 NOTE — Patient Instructions (Addendum)
ICD-10-CM   1. Chronic cough R05   2. Cough variant asthma J45.991   3. Chronic fatigue R53.82   4. Weight loss, non-intentional R63.4   5. Dyspnea on exertion R06.09   6. Physical deconditioning R53.81    Chronic cough and cough variant asthma - new dx 10/10/2017 - you have cough variant asthma related to your allergies =- check blood IgE and CBC with diff 10/10/2017 - Please take prednisone 40 mg x1 day, then 30 mg x1 day, then 20 mg x1 day, then 10 mg x1 day, and then 5 mg x1 day and stop - start breo low dose daily - albuterol as needed - rechck feno in 4 weeks  Chronic fatigue Weight loss, non-intentional Dyspnea on exertion Physical deconditioning  -I still do not know what is contributing to the constellation of symptoms. --You do not have pleural effusion or pericardial effusion anymore -Autoimmune and vasculitis antibodies are negative. -There is no evidence of emphysema or pulmonary fibrosis - check ESR again, CK again, and AchRab blood test again 10/10/2017 - we will review this at followup - when we see how you are responding to asthma treatment - might consider rehab v CPST v neuro referral depending on results    Folllowup 4-6 weeks to re-eval above -

## 2017-10-13 LAB — IGE: IgE (Immunoglobulin E), Serum: 4042 kU/L — ABNORMAL HIGH (ref <=114)

## 2017-10-14 ENCOUNTER — Telehealth: Payer: Self-pay | Admitting: Internal Medicine

## 2017-10-14 DIAGNOSIS — J45991 Cough variant asthma: Secondary | ICD-10-CM

## 2017-10-14 NOTE — Telephone Encounter (Signed)
Raquel Sarna see below

## 2017-10-14 NOTE — Telephone Encounter (Signed)
Let [name] [name] know that his IgE blood is crazy high at 4K (normal is 100). This makes me worried he has an asthma associted condition called ABPA (CT with mild bronchiectasis and eo [id number]). 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 fumigatus - Serum Aspergillus IgG by immunoassay  Ensure ROV next few to several weeks but need blood work first few weeks to several days before visit

## 2017-10-15 ENCOUNTER — Telehealth: Payer: Self-pay | Admitting: Internal Medicine

## 2017-10-15 ENCOUNTER — Other Ambulatory Visit: Payer: BC Managed Care – PPO

## 2017-10-15 DIAGNOSIS — J45991 Cough variant asthma: Secondary | ICD-10-CM

## 2017-10-15 NOTE — Telephone Encounter (Signed)
Called and spoke with patient, he is aware of MR result note. Patient will be in today 10/15/17 for the additional blood work, patient is scheduled for 10/24/17 for the follow up appointment MR requested. Blood work ordered, nothing further needed.

## 2017-10-15 NOTE — Telephone Encounter (Signed)
Order has been changed with Clayborne Dana, CMA's help. Nothing further was needed.

## 2017-10-16 ENCOUNTER — Encounter: Payer: Self-pay | Admitting: Cardiology

## 2017-10-16 ENCOUNTER — Ambulatory Visit (INDEPENDENT_AMBULATORY_CARE_PROVIDER_SITE_OTHER): Payer: Medicare Other | Admitting: Cardiology

## 2017-10-16 VITALS — BP 102/68 | HR 69 | Ht 69.5 in | Wt 124.0 lb

## 2017-10-16 DIAGNOSIS — I350 Nonrheumatic aortic (valve) stenosis: Secondary | ICD-10-CM

## 2017-10-16 DIAGNOSIS — I313 Pericardial effusion (noninflammatory): Secondary | ICD-10-CM

## 2017-10-16 DIAGNOSIS — E7800 Pure hypercholesterolemia, unspecified: Secondary | ICD-10-CM

## 2017-10-16 DIAGNOSIS — I5032 Chronic diastolic (congestive) heart failure: Secondary | ICD-10-CM

## 2017-10-16 DIAGNOSIS — I10 Essential (primary) hypertension: Secondary | ICD-10-CM

## 2017-10-16 DIAGNOSIS — I4892 Unspecified atrial flutter: Secondary | ICD-10-CM | POA: Diagnosis not present

## 2017-10-16 DIAGNOSIS — I2510 Atherosclerotic heart disease of native coronary artery without angina pectoris: Secondary | ICD-10-CM

## 2017-10-16 DIAGNOSIS — I3139 Other pericardial effusion (noninflammatory): Secondary | ICD-10-CM

## 2017-10-16 HISTORY — DX: Chronic diastolic (congestive) heart failure: I50.32

## 2017-10-16 LAB — INTERPRETATION:

## 2017-10-16 LAB — ALLERGEN A FUMIGATUS M3
Aspergillus fumigatus, m3: 2.81 kU/L — ABNORMAL HIGH
CLASS: 2

## 2017-10-16 NOTE — Progress Notes (Signed)
Cardiology Office Note:    Date:  10/16/2017   ID:  [name] [name], DOB 10/25/1935, MRN [id number]  PCP:  Maury Dus, MD  Cardiologist:  Fransico Him, MD    Referring MD: Maury Dus, MD   Chief Complaint  Patient presents with  . Coronary Artery Disease  . Hypertension  . Hyperlipidemia  . Atrial Fibrillation  . Aortic Stenosis    History of Present Illness:    [name] [name] is a 82 y.o. male with a hx of dyslipidemia and family history of CAD, bicuspid aortic valve with moderate AS and mildly dilated aortic root and mild MVP.His AS progressed over the past year and was referred to Dr. Burt Knack for evaluation of severe AS. LHC showed nonobstructive ASCAD in the LAD and ramus with hemodynamically significant RCA stenosis by FFR and underwent PCI of the RCA with DES. He subsequently underwent TAVR 07/02/2016.   He has a chronic pericardial effusion that was moderate on the day of the TAVR with no tamponade and had been present prior to the TAVR.He was then admitted with chest pain and syncope and was found to be in new onset atrial fibrillation. He apparently had also complained of losing 40 pounds over the past year that was unintentional. Pulmonary nodules are noted on chest CT back in February 2019. It was felt that his chest pain is atypical and likely musculoskeletal as well as a pleuritic component. He was started on ibuprofen 600 mg 3 times daily along with Pepcid for 10 days. His Plavix was stopped. It was recommended that before starting anticoagulation with Eliquis he be treated for 2 weeks with anti-inflammatories due to the pleuritic condition of his chest pain as well as his pericardial effusion.   He was seen back by me on 05/16/2017 with complaint of a 40 pound unintentional weight loss over several months as well as pulmonary nodules that were noted on chest CT.  CRP and sedimentation rate were elevated at 48.  Given his findings of pulmonary nodules there was  concern of possible malignancy.  He was referred to [facility].  2D echocardiogram showed persistence of his pericardial effusion.  Cardiac MRI was done which showed moderate bilateral pleural effusions and a moderate loculated pericardial effusion more prominent laterally and inferiorly with no evidence of Tamponade.  EF was 54% with mildly dilated RV.  He he had evidence of right-sided heart failure when I saw him last.  I referred him to interventional radiology for diagnostic thoracentesis.  1 L of pleural fluid was removed with no evidence of malignancy on cytology.    He was referred to Dr. Aundra Dubin for right and left heart cath to rule out effusive/constrictive pericarditis. This revealed mildly elevated right and left heart filling pressures, moderate pulmonary venous hypertension, marginal cardiac output and no heme anemic evidence of constrictive pericarditis with concordance of LV and RV pressure tracings.  There was a patent RCA stent, 70% mid LAD stenosis which was unchanged from cardiac cath a year ago.  FFR to was negative at the time of cath in 2018.  His Lasix was increased to 40 mg twice daily.  It was recommended to keep him off anticoagulation for 2 additional weeks and then restart his Eliquis.  Past Medical History:  Diagnosis Date  . Actinic keratoses    Dr Glee Arvin  . Allergic rhinitis   . Aortic stenosis    s/p TAVR 06/2016  . Arthritis   . Arthritis of left hip 11/03/2014  .  Benign essential HTN   . Bicuspid aortic valve   . Bilateral cataracts   . 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 BPH (benign prostatic hypertrophy)   . Chronic diastolic CHF (congestive heart failure) ([place]) 10/16/2017  . Coronary artery disease involving native coronary artery of native heart without angina pectoris    PCI 06/06/16 LAD proximal 60 (FFR 0.85-negative); RI 50; LCx calcified without significant stenosis; RCA mid 80 (FFR 0.68-hemodynamically significant); PCI: 2.25 x 16 mm Synergy DES to  the mid RCA  . Dilated aortic root ([place])    62mm by echo 08/2015  . Glaucoma   . H/O seasonal allergies   . Hx of adenomatous colonic polyps 1991 on  . Hx of cardiovascular stress test    Myoview (10/15):  normal  . Hyperlipidemia   . Mitral valve prolapse    mild MR by echo 08/2015  . Multifocal pneumonia 12/01/2016  . New onset atrial fibrillation ([place]) 04/23/2017  . OA (osteoarthritis) of hip 10/16/2016  . PAF (paroxysmal atrial fibrillation) ([place])   . Paresthesia    Chronic left lateral thigh  . Pericardial effusion 11/05/2016  . Postural dizziness with presyncope 04/23/2017  . Pulmonary nodules   . Right heart failure ([place])   . S/P TAVR (transcatheter aortic valve replacement)     Past Surgical History:  Procedure Laterality Date  . COLONOSCOPY  multiple  . CORONARY STENT INTERVENTION N/A 06/06/2016   Procedure: Coronary Stent Intervention;  Surgeon: [name], MD;  Location: [place] [facility];  Service: Cardiovascular;  Laterality: N/A;  . CYSTOSCOPY  08/12/2016   per patient; showed a cyst on bladder , liver , and kidney , sees urologist Matilde Bash III at [facility]  . EYE SURGERY     Bilateral cataract removal  . INTRAVASCULAR PRESSURE WIRE/FFR STUDY N/A 05/29/2016   Procedure: Intravascular Pressure Wire/FFR Study;  Surgeon: [name], MD;  Location: [place] [facility];  Service: Cardiovascular;  Laterality: N/A;  . IR THORACENTESIS ASP PLEURAL SPACE W/IMG GUIDE  05/28/2017  . RIGHT/LEFT HEART CATH AND CORONARY ANGIOGRAPHY N/A 05/29/2016   Procedure: Right/Left Heart Cath and Coronary Angiography;  Surgeon: [name], MD;  Location: [place] [facility];  Service: Cardiovascular;  Laterality: N/A;  . RIGHT/LEFT HEART CATH AND CORONARY ANGIOGRAPHY N/A 06/06/2017   Procedure: RIGHT/LEFT HEART CATH AND CORONARY ANGIOGRAPHY;  Surgeon: Larey Dresser, MD;  Location: [place] [facility];  Service: Cardiovascular;  Laterality: N/A;  . TEE WITHOUT CARDIOVERSION N/A  07/02/2016   Procedure: TRANSESOPHAGEAL ECHOCARDIOGRAM (TEE);  Surgeon: [name], MD;  Location: [place];  Service: Open Heart Surgery;  Laterality: N/A;  . TONSILLECTOMY  1949  . TOTAL HIP ARTHROPLASTY Left 10/16/2016   Procedure: LEFT TOTAL HIP ARTHROPLASTY ANTERIOR APPROACH;  Surgeon: Gaynelle Arabian, MD;  Location: [facility];  Service: Orthopedics;  Laterality: Left;  . TRANSCATHETER AORTIC VALVE REPLACEMENT, TRANSFEMORAL N/A 07/02/2016   Procedure: TRANSCATHETER AORTIC VALVE REPLACEMENT, TRANSFEMORAL;  Surgeon: [name], MD;  Location: [place];  Service: Open Heart Surgery;  Laterality: N/A;    Current Medications: Current Meds  Medication Sig  . albuterol (PROVENTIL HFA;VENTOLIN HFA) 108 (90 Base) MCG/ACT inhaler Inhale 2 puffs into the lungs every 6 (six) hours as needed for wheezing or shortness of breath.  Marland Kitchen amoxicillin (AMOXIL) 500 MG capsule TAKE 4 CAPSULES ONE HOUR PRIOR TO DENTAL PROCEDURE.  Marland Kitchen apixaban (ELIQUIS) 5 MG TABS tablet Take 1 tablet (5 mg total) by mouth 2 (two) times daily.  . colchicine 0.6  MG tablet Take 1 tablet (0.6 mg total) by mouth 2 (two) times daily.  . feeding supplement, ENSURE ENLIVE, (ENSURE ENLIVE) LIQD Take 237 mLs by mouth daily.  . fluticasone (FLONASE) 50 MCG/ACT nasal spray Place 1 spray into both nostrils daily.  . fluticasone furoate-vilanterol (BREO ELLIPTA) 100-25 MCG/INH AEPB Inhale 1 puff into the lungs daily.  . furosemide (LASIX) 20 MG tablet Take 2 tablets (40 mg total) by mouth 2 (two) times daily.  Marland Kitchen latanoprost (XALATAN) 0.005 % ophthalmic solution Place 1 drop into both eyes at bedtime.  . pravastatin (PRAVACHOL) 40 MG tablet Take 40 mg by mouth at bedtime.   . predniSONE (DELTASONE) 10 MG tablet Take 40mg x1day,30mg x1day,20mg x1day,10mg x1day,5mg x1day,then stop  . timolol (BETIMOL) 0.5 % ophthalmic solution Place 1 drop into both eyes 2 (two) times daily.     Allergies:   Patient has no known allergies.   Social History    Socioeconomic History  . Marital status: Widowed    Spouse name: Not on file  . Number of children: Not on file  . Years of education: Not on file  . Highest education level: Not on file  Occupational History  . Not on file  Social Needs  . Financial resource strain: Not on file  . Food insecurity:    Worry: Not on file    Inability: Not on file  . Transportation needs:    Medical: Not on file    Non-medical: Not on file  Tobacco Use  . Smoking status: Former Smoker    Packs/day: 1.00    Years: 1.00    Pack years: 1.00    Types: Cigarettes    Last attempt to quit: 07/14/1960    Years since quitting: 57.2  . Smokeless tobacco: Never Used  Substance and Sexual Activity  . Alcohol use: No    Frequency: Never  . Drug use: No  . Sexual activity: Not on file  Lifestyle  . Physical activity:    Days per week: Not on file    Minutes per session: Not on file  . Stress: Not on file  Relationships  . Social connections:    Talks on phone: Not on file    Gets together: Not on file    Attends religious service: Not on file    Active member of club or organization: Not on file    Attends meetings of clubs or organizations: Not on file    Relationship status: Not on file  Other Topics Concern  . Not on file  Social History Narrative  . Not on file     Family History: The patient's family history includes Emphysema in his mother; Heart attack in his brother; Heart disease in his brother; Pancreatic cancer in his father. There is no history of Colon cancer or Stomach cancer.  ROS:   Please see the history of present illness.    ROS  All other systems reviewed and negative.   EKGs/Labs/Other Studies Reviewed:    The following studies were reviewed today: none  EKG:  EKG is  ordered today.  The ekg ordered today demonstrates atrial flutter with CVR  Recent Labs: 11/05/2016: ALT 9 04/23/2017: Magnesium 2.0 05/16/2017: TSH 2.740 07/01/2017: BUN 30; Creatinine, Ser 0.91;  NT-Pro BNP 2,182; Potassium 4.6; Sodium 141 10/10/2017: Hemoglobin 14.9; Platelets 214.0   Recent Lipid Panel    Component Value Date/Time   CHOL 120 11/05/2016 0936   TRIG 81 11/05/2016 0936   HDL 39 (L) 11/05/2016 0936   CHOLHDL  3.1 11/05/2016 0936   LDLCALC 65 11/05/2016 0936    Physical Exam:    VS:  BP 102/68   Pulse 69   Ht 5' 9.5" (1.765 m)   Wt 124 lb (56.2 kg)   SpO2 96%   BMI 18.05 kg/m     Wt Readings from Last 3 Encounters:  10/16/17 124 lb (56.2 kg)  10/10/17 120 lb 6.4 oz (54.6 kg)  09/19/17 122 lb 3.2 oz (55.4 kg)     GEN:  Well nourished, well developed in no acute distress HEENT: Normal NECK: No JVD; No carotid bruits LYMPHATICS: No lymphadenopathy CARDIAC: RRR, no murmurs, rubs, gallops RESPIRATORY:  Clear to auscultation without rales, wheezing or rhonchi  ABDOMEN: Soft, non-tender, non-distended MUSCULOSKELETAL:  No edema; No deformity  SKIN: Warm and dry NEUROLOGIC:  Alert and oriented x 3 PSYCHIATRIC:  Normal affect   ASSESSMENT:    1. Nonrheumatic aortic valve stenosis   2. Benign essential HTN   3. Coronary artery disease involving native coronary artery of native heart without angina pectoris   4. Pericardial effusion   5. PAF (paroxysmal atrial fibrillation) ([facility])   6. Pure hypercholesterolemia   7. Atrial flutter, unspecified type ([place])   8. Chronic diastolic CHF (congestive heart failure) ([facility])    PLAN:    In order of problems listed above:  1.  Severe AS s/p TAVR -  He is doing well.  Recent echo 07/2017 showed normal LVF with stable TAVR with AVA 1.36cm2 and mean AVG 28mmHg.  He will continue on ASA 81mg  daily.    2.  HTN - BP is controlled on exam today.  He has not required any antihypertensive meds recently.    3.  ASCAD - nonobstructive ASCAD in the LAD and ramus with hemodynamically significant RCA stenosis by FFR s/p PCI of the RCA with DES.  He will continue on statin.  He is not on ASA due to DOAC.  4.  Pericardial  effusion - resolved on echo 07/2017.  Decrease colchicine to 0.6mg  daily for 3 months and then repeat echo and if no recurrent pericardial effusion then will wean off.  5.  PAF/atrial flutter - he is in atrial flutter today with controlled VR.  He was in NSR in May and appeared to be in atrial flutter at time of echo 07/2017.  He is on Eliquis and has not missed any doses in the past 4 weeks.  I have recommended that we proceed with DCCV in hopes that he will get some of his energy back.    6.  Hyperlipidemia - his LDL goal is < 70.  He will continue on Pravastatin 40mg  daily.    7.  Chronic diastolic CHF - He appears euvolemic on exam today.  He will continue on Lasix 40mg  BID.       Medication Adjustments/Labs and Tests Ordered: Current medicines are reviewed at length with the patient today.  Concerns regarding medicines are outlined above.  Orders Placed This Encounter  Procedures  . Basic metabolic panel  . CBC w/Diff  . EKG 12-Lead   No orders of the defined types were placed in this encounter.   Signed, Fransico Him, MD  10/16/2017 10:56 PM    [place]

## 2017-10-16 NOTE — Patient Instructions (Addendum)
Medication Instructions:  Your physician recommends that you continue on your current medications as directed. Please refer to the Current Medication list given to you today.  Labwork: Future: PT/INR, BMET and CBC w/diff on 10/8  Testing/Procedures: Your physician has recommended that you have a Cardioversion (DCCV). Electrical Cardioversion uses a jolt of electricity to your heart either through paddles or wired patches attached to your chest. This is a controlled, usually prescheduled, procedure. Defibrillation is done under light anesthesia in the hospital, and you usually go home the day of the procedure. This is done to get your heart back into a normal rhythm. You are not awake for the procedure. Please see the instruction sheet given to you today.  You are scheduled on 10/14 at 7:30.  Follow-Up: Your physician wants you to follow-up in: 3 months with Dr. Radford Pax. You will receive a reminder letter in the mail two months in advance. If you don't receive a letter, please call our office to schedule the follow-up appointment.  Any Other Special Instructions Will Be Listed Below (If Applicable).  Dear [name] [name]  You are scheduled for a Cardioversion on 10/14 with Dr. Johnsie Cancel.  Please arrive at the [facility] (Main Entrance A) at [facility]: [address] [place], [place] [address] at 6:30 am/pm. (1 hour prior to procedure unless lab work is needed; if lab work is needed arrive 1.5 hours ahead)  DIET: Nothing to eat or drink after midnight except a sip of water with medications (see medication instructions below)  Medication Instructions: Hold Lasix the morning of the procedure  Continue your anticoagulant: Eliqius  Labs: If patient is on Coumadin, patient needs pt/INR, CBC, BMET within 3 days (No pt/INR needed for patients taking Xarelto, Eliquis, Pradaxa) For patients receiving anesthesia for TEE and all Cardioversion patients: BMET, CBC within 1 week  You must have a  responsible person to drive you home and stay in the waiting area during your procedure. Failure to do so could result in cancellation.  Bring your insurance cards.  *Special Note: Every effort is made to have your procedure done on time. Occasionally there are emergencies that occur at the hospital that may cause delays. Please be patient if a delay does occur.      If you need a refill on your cardiac medications before your next appointment, please call your pharmacy.

## 2017-10-16 NOTE — H&P (View-Only) (Signed)
Cardiology Office Note:    Date:  10/16/2017   ID:  [name] [name], DOB 05/31/1935, MRN [id number]  PCP:  Maury Dus, MD  Cardiologist:  Fransico Him, MD    Referring MD: Maury Dus, MD   Chief Complaint  Patient presents with  . Coronary Artery Disease  . Hypertension  . Hyperlipidemia  . Atrial Fibrillation  . Aortic Stenosis    History of Present Illness:    [name] [name] is a 82 y.o. male with a hx of dyslipidemia and family history of CAD, bicuspid aortic valve with moderate AS and mildly dilated aortic root and mild MVP.His AS progressed over the past year and was referred to Dr. Burt Knack for evaluation of severe AS. LHC showed nonobstructive ASCAD in the LAD and ramus with hemodynamically significant RCA stenosis by FFR and underwent PCI of the RCA with DES. He subsequently underwent TAVR 07/02/2016.   He has a chronic pericardial effusion that was moderate on the day of the TAVR with no tamponade and had been present prior to the TAVR.He was then admitted with chest pain and syncope and was found to be in new onset atrial fibrillation. He apparently had also complained of losing 40 pounds over the past year that was unintentional. Pulmonary nodules are noted on chest CT back in February 2019. It was felt that his chest pain is atypical and likely musculoskeletal as well as a pleuritic component. He was started on ibuprofen 600 mg 3 times daily along with Pepcid for 10 days. His Plavix was stopped. It was recommended that before starting anticoagulation with Eliquis he be treated for 2 weeks with anti-inflammatories due to the pleuritic condition of his chest pain as well as his pericardial effusion.   He was seen back by me on 05/16/2017 with complaint of a 40 pound unintentional weight loss over several months as well as pulmonary nodules that were noted on chest CT.  CRP and sedimentation rate were elevated at 48.  Given his findings of pulmonary nodules there was  concern of possible malignancy.  He was referred to [facility].  2D echocardiogram showed persistence of his pericardial effusion.  Cardiac MRI was done which showed moderate bilateral pleural effusions and a moderate loculated pericardial effusion more prominent laterally and inferiorly with no evidence of Tamponade.  EF was 54% with mildly dilated RV.  He he had evidence of right-sided heart failure when I saw him last.  I referred him to interventional radiology for diagnostic thoracentesis.  1 L of pleural fluid was removed with no evidence of malignancy on cytology.    He was referred to Dr. Aundra Dubin for right and left heart cath to rule out effusive/constrictive pericarditis. This revealed mildly elevated right and left heart filling pressures, moderate pulmonary venous hypertension, marginal cardiac output and no heme anemic evidence of constrictive pericarditis with concordance of LV and RV pressure tracings.  There was a patent RCA stent, 70% mid LAD stenosis which was unchanged from cardiac cath a year ago.  FFR to was negative at the time of cath in 2018.  His Lasix was increased to 40 mg twice daily.  It was recommended to keep him off anticoagulation for 2 additional weeks and then restart his Eliquis.  Past Medical History:  Diagnosis Date  . Actinic keratoses    Dr Glee Arvin  . Allergic rhinitis   . Aortic stenosis    s/p TAVR 06/2016  . Arthritis   . Arthritis of left hip 11/03/2014  .  Benign essential HTN   . Bicuspid aortic valve   . Bilateral cataracts   . 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 BPH (benign prostatic hypertrophy)   . Chronic diastolic CHF (congestive heart failure) ([place]) 10/16/2017  . Coronary artery disease involving native coronary artery of native heart without angina pectoris    PCI 06/06/16 LAD proximal 60 (FFR 0.85-negative); RI 50; LCx calcified without significant stenosis; RCA mid 80 (FFR 0.68-hemodynamically significant); PCI: 2.25 x 16 mm Synergy DES to  the mid RCA  . Dilated aortic root ([place])    76mm by echo 08/2015  . Glaucoma   . H/O seasonal allergies   . Hx of adenomatous colonic polyps 1991 on  . Hx of cardiovascular stress test    Myoview (10/15):  normal  . Hyperlipidemia   . Mitral valve prolapse    mild MR by echo 08/2015  . Multifocal pneumonia 12/01/2016  . New onset atrial fibrillation ([place]) 04/23/2017  . OA (osteoarthritis) of hip 10/16/2016  . PAF (paroxysmal atrial fibrillation) ([place])   . Paresthesia    Chronic left lateral thigh  . Pericardial effusion 11/05/2016  . Postural dizziness with presyncope 04/23/2017  . Pulmonary nodules   . Right heart failure ([place])   . S/P TAVR (transcatheter aortic valve replacement)     Past Surgical History:  Procedure Laterality Date  . COLONOSCOPY  multiple  . CORONARY STENT INTERVENTION N/A 06/06/2016   Procedure: Coronary Stent Intervention;  Surgeon: [name], MD;  Location: [facility];  Service: Cardiovascular;  Laterality: N/A;  . CYSTOSCOPY  08/12/2016   per patient; showed a cyst on bladder , liver , and kidney , sees urologist Matilde Bash III at [facility]  . EYE SURGERY     Bilateral cataract removal  . INTRAVASCULAR PRESSURE WIRE/FFR STUDY N/A 05/29/2016   Procedure: Intravascular Pressure Wire/FFR Study;  Surgeon: [name], MD;  Location: [facility];  Service: Cardiovascular;  Laterality: N/A;  . IR THORACENTESIS ASP PLEURAL SPACE W/IMG GUIDE  05/28/2017  . RIGHT/LEFT HEART CATH AND CORONARY ANGIOGRAPHY N/A 05/29/2016   Procedure: Right/Left Heart Cath and Coronary Angiography;  Surgeon: [name], MD;  Location: [facility];  Service: Cardiovascular;  Laterality: N/A;  . RIGHT/LEFT HEART CATH AND CORONARY ANGIOGRAPHY N/A 06/06/2017   Procedure: RIGHT/LEFT HEART CATH AND CORONARY ANGIOGRAPHY;  Surgeon: Larey Dresser, MD;  Location: [facility];  Service: Cardiovascular;  Laterality: N/A;  . TEE WITHOUT CARDIOVERSION N/A  07/02/2016   Procedure: TRANSESOPHAGEAL ECHOCARDIOGRAM (TEE);  Surgeon: [name], MD;  Location: [facility];  Service: Open Heart Surgery;  Laterality: N/A;  . TONSILLECTOMY  1949  . TOTAL HIP ARTHROPLASTY Left 10/16/2016   Procedure: LEFT TOTAL HIP ARTHROPLASTY ANTERIOR APPROACH;  Surgeon: Gaynelle Arabian, MD;  Location: [facility];  Service: Orthopedics;  Laterality: Left;  . TRANSCATHETER AORTIC VALVE REPLACEMENT, TRANSFEMORAL N/A 07/02/2016   Procedure: TRANSCATHETER AORTIC VALVE REPLACEMENT, TRANSFEMORAL;  Surgeon: [name], MD;  Location: [facility];  Service: Open Heart Surgery;  Laterality: N/A;    Current Medications: Current Meds  Medication Sig  . albuterol (PROVENTIL HFA;VENTOLIN HFA) 108 (90 Base) MCG/ACT inhaler Inhale 2 puffs into the lungs every 6 (six) hours as needed for wheezing or shortness of breath.  Marland Kitchen amoxicillin (AMOXIL) 500 MG capsule TAKE 4 CAPSULES ONE HOUR PRIOR TO DENTAL PROCEDURE.  Marland Kitchen apixaban (ELIQUIS) 5 MG TABS tablet Take 1 tablet (5 mg total) by mouth 2 (two) times daily.  . colchicine 0.6  MG tablet Take 1 tablet (0.6 mg total) by mouth 2 (two) times daily.  . feeding supplement, ENSURE ENLIVE, (ENSURE ENLIVE) LIQD Take 237 mLs by mouth daily.  . fluticasone (FLONASE) 50 MCG/ACT nasal spray Place 1 spray into both nostrils daily.  . fluticasone furoate-vilanterol (BREO ELLIPTA) 100-25 MCG/INH AEPB Inhale 1 puff into the lungs daily.  . furosemide (LASIX) 20 MG tablet Take 2 tablets (40 mg total) by mouth 2 (two) times daily.  Marland Kitchen latanoprost (XALATAN) 0.005 % ophthalmic solution Place 1 drop into both eyes at bedtime.  . pravastatin (PRAVACHOL) 40 MG tablet Take 40 mg by mouth at bedtime.   . predniSONE (DELTASONE) 10 MG tablet Take 40mg x1day,30mg x1day,20mg x1day,10mg x1day,5mg x1day,then stop  . timolol (BETIMOL) 0.5 % ophthalmic solution Place 1 drop into both eyes 2 (two) times daily.     Allergies:   Patient has no known allergies.   Social History    Socioeconomic History  . Marital status: Widowed    Spouse name: Not on file  . Number of children: Not on file  . Years of education: Not on file  . Highest education level: Not on file  Occupational History  . Not on file  Social Needs  . Financial resource strain: Not on file  . Food insecurity:    Worry: Not on file    Inability: Not on file  . Transportation needs:    Medical: Not on file    Non-medical: Not on file  Tobacco Use  . Smoking status: Former Smoker    Packs/day: 1.00    Years: 1.00    Pack years: 1.00    Types: Cigarettes    Last attempt to quit: 07/14/1960    Years since quitting: 57.2  . Smokeless tobacco: Never Used  Substance and Sexual Activity  . Alcohol use: No    Frequency: Never  . Drug use: No  . Sexual activity: Not on file  Lifestyle  . Physical activity:    Days per week: Not on file    Minutes per session: Not on file  . Stress: Not on file  Relationships  . Social connections:    Talks on phone: Not on file    Gets together: Not on file    Attends religious service: Not on file    Active member of club or organization: Not on file    Attends meetings of clubs or organizations: Not on file    Relationship status: Not on file  Other Topics Concern  . Not on file  Social History Narrative  . Not on file     Family History: The patient's family history includes Emphysema in his mother; Heart attack in his brother; Heart disease in his brother; Pancreatic cancer in his father. There is no history of Colon cancer or Stomach cancer.  ROS:   Please see the history of present illness.    ROS  All other systems reviewed and negative.   EKGs/Labs/Other Studies Reviewed:    The following studies were reviewed today: none  EKG:  EKG is  ordered today.  The ekg ordered today demonstrates atrial flutter with CVR  Recent Labs: 11/05/2016: ALT 9 04/23/2017: Magnesium 2.0 05/16/2017: TSH 2.740 07/01/2017: BUN 30; Creatinine, Ser 0.91;  NT-Pro BNP 2,182; Potassium 4.6; Sodium 141 10/10/2017: Hemoglobin 14.9; Platelets 214.0   Recent Lipid Panel    Component Value Date/Time   CHOL 120 11/05/2016 0936   TRIG 81 11/05/2016 0936   HDL 39 (L) 11/05/2016 0936   CHOLHDL  3.1 11/05/2016 0936   LDLCALC 65 11/05/2016 0936    Physical Exam:    VS:  BP 102/68   Pulse 69   Ht 5' 9.5" (1.765 m)   Wt 124 lb (56.2 kg)   SpO2 96%   BMI 18.05 kg/m     Wt Readings from Last 3 Encounters:  10/16/17 124 lb (56.2 kg)  10/10/17 120 lb 6.4 oz (54.6 kg)  09/19/17 122 lb 3.2 oz (55.4 kg)     GEN:  Well nourished, well developed in no acute distress HEENT: Normal NECK: No JVD; No carotid bruits LYMPHATICS: No lymphadenopathy CARDIAC: RRR, no murmurs, rubs, gallops RESPIRATORY:  Clear to auscultation without rales, wheezing or rhonchi  ABDOMEN: Soft, non-tender, non-distended MUSCULOSKELETAL:  No edema; No deformity  SKIN: Warm and dry NEUROLOGIC:  Alert and oriented x 3 PSYCHIATRIC:  Normal affect   ASSESSMENT:    1. Nonrheumatic aortic valve stenosis   2. Benign essential HTN   3. Coronary artery disease involving native coronary artery of native heart without angina pectoris   4. Pericardial effusion   5. PAF (paroxysmal atrial fibrillation) ([facility])   6. Pure hypercholesterolemia   7. Atrial flutter, unspecified type ([place])   8. Chronic diastolic CHF (congestive heart failure) (H[facility])    PLAN:    In order of problems listed above:  1.  Severe AS s/p TAVR -  He is doing well.  Recent echo 07/2017 showed normal LVF with stable TAVR with AVA 1.36cm2 and mean AVG 8mmHg.  He will continue on ASA 81mg  daily.    2.  HTN - BP is controlled on exam today.  He has not required any antihypertensive meds recently.    3.  ASCAD - nonobstructive ASCAD in the LAD and ramus with hemodynamically significant RCA stenosis by FFR s/p PCI of the RCA with DES.  He will continue on statin.  He is not on ASA due to DOAC.  4.  Pericardial  effusion - resolved on echo 07/2017.  Decrease colchicine to 0.6mg  daily for 3 months and then repeat echo and if no recurrent pericardial effusion then will wean off.  5.  PAF/atrial flutter - he is in atrial flutter today with controlled VR.  He was in NSR in May and appeared to be in atrial flutter at time of echo 07/2017.  He is on Eliquis and has not missed any doses in the past 4 weeks.  I have recommended that we proceed with DCCV in hopes that he will get some of his energy back.    6.  Hyperlipidemia - his LDL goal is < 70.  He will continue on Pravastatin 40mg  daily.    7.  Chronic diastolic CHF - He appears euvolemic on exam today.  He will continue on Lasix 40mg  BID.       Medication Adjustments/Labs and Tests Ordered: Current medicines are reviewed at length with the patient today.  Concerns regarding medicines are outlined above.  Orders Placed This Encounter  Procedures  . Basic metabolic panel  . CBC w/Diff  . EKG 12-Lead   No orders of the defined types were placed in this encounter.   Signed, Fransico Him, MD  10/16/2017 10:56 PM    [place]

## 2017-10-17 ENCOUNTER — Telehealth: Payer: Self-pay | Admitting: Cardiology

## 2017-10-17 LAB — ACETYLCHOLINE RECEPTOR AB, ALL
AChR Binding Ab, Serum: 0.04 nmol/L (ref 0.00–0.24)
Acetylchol Block Ab: 20 % (ref 0–25)
Acetylcholine Modulat Ab: <12 % (ref 0–20)

## 2017-10-17 NOTE — Telephone Encounter (Signed)
New message:        Pt is calling and states he has question about his upcoming cardioversion.

## 2017-10-17 NOTE — Telephone Encounter (Signed)
Spoke with the patient about the length of the cardioversion. He accepted and had no further questions.

## 2017-10-20 LAB — RESPIRATORY ALLERGY PROFILE REGION II ~~LOC~~
ALLERGEN, A. ALTERNATA, M6: 0.23 kU/L — AB
ALLERGEN, COMM SILVER BIRCH, T3: 0.11 kU/L — AB
ALLERGEN, COTTONWOOD, T14: 0.19 kU/L — AB
ALLERGEN, D PTERNOYSSINUS, D1: 0.58 kU/L — AB
ALLERGEN, OAK, T7: 0.11 kU/L — AB
Allergen, Cedar tree, t12: 0.13 kU/L — ABNORMAL HIGH
Allergen, Mouse Urine Protein, e78: <0.1 kU/L
Allergen, Mulberry, t76: <0.1 kU/L
Allergen, P. notatum, m1: 2.01 kU/L — ABNORMAL HIGH
Aspergillus fumigatus, m3: 3.05 kU/L — ABNORMAL HIGH
Bermuda Grass: 2.1 kU/L — ABNORMAL HIGH
Box Elder IgE: 0.17 kU/L — ABNORMAL HIGH
CAT DANDER: 4.29 kU/L — AB
CLADOSPORIUM HERBARUM (M2) IGE: 0.14 kU/L — ABNORMAL HIGH
CLASS: 0
CLASS: 0
CLASS: 0
CLASS: 0
CLASS: 0
CLASS: 0
CLASS: 0
CLASS: 1
CLASS: 1
CLASS: 2
CLASS: 2
CLASS: 3
COCKROACH: 0.13 kU/L — AB
COMMON RAGWEED (SHORT) (W1) IGE: 0.43 kU/L — ABNORMAL HIGH
Class: 0
Class: 0
Class: 0
Class: 0
Class: 0
Class: 0
Class: 0
Class: 0
Class: 1
Class: 2
Class: 2
Class: 3
D. farinae: 0.66 kU/L — ABNORMAL HIGH
Dog Dander: 0.3 kU/L — ABNORMAL HIGH
ELM IGE: 0.18 kU/L — AB
IGE (IMMUNOGLOBULIN E), SERUM: 2897 kU/L — AB (ref <=114)
JOHNSON GRASS: 1.3 kU/L — AB
Pecan/Hickory Tree IgE: 0.16 kU/L — ABNORMAL HIGH
Rough Pigweed  IgE: 0.12 kU/L — ABNORMAL HIGH
SHEEP SORREL IGE: 0.11 kU/L — AB
TIMOTHY GRASS: 4.3 kU/L — AB

## 2017-10-20 LAB — INTERPRETATION:

## 2017-10-20 LAB — ALPHA-1 ANTITRYPSIN PHENOTYPE: A-1 Antitrypsin, Ser: 159 mg/dL (ref 83–199)

## 2017-10-20 LAB — ALLERGEN A FUMIGATUS IGG: ASPERGILLUS FUMIGATUS(M3)IGG: 25.1 ug/mL — AB (ref <2.0)

## 2017-10-21 ENCOUNTER — Other Ambulatory Visit: Payer: Medicare Other

## 2017-10-21 ENCOUNTER — Telehealth: Payer: Self-pay | Admitting: Internal Medicine

## 2017-10-21 DIAGNOSIS — I4892 Unspecified atrial flutter: Secondary | ICD-10-CM

## 2017-10-21 DIAGNOSIS — I2510 Atherosclerotic heart disease of native coronary artery without angina pectoris: Secondary | ICD-10-CM

## 2017-10-21 DIAGNOSIS — I10 Essential (primary) hypertension: Secondary | ICD-10-CM

## 2017-10-21 LAB — ASPERGILLUS IGE PANEL
A. AMSTEL/GLAUCU CLASS INTERP: 0
A. Flavus Class Interp: 0
A. Fumigatus Class Interp: 2
A. NIGER CLASS INTERP: 2
A. Nidulans Class Interp: 0
A. TERREUS CLASS INTERP: 0
A. Versicolor Class Interp: 1
ASPERGILLUS FUMIGATUS IGE: 2.67 kU/L — AB (ref <0.35)
ASPERGILLUS VERSICOLOR IGE: 0.63 kU/L — AB (ref <0.35)
Aspergillus niger IgE: 3.35 kU/L — ABNORMAL HIGH (ref <0.35)

## 2017-10-21 LAB — CBC WITH DIFFERENTIAL/PLATELET
BASOS: 1 %
Basophils Absolute: 0.1 10*3/uL (ref 0.0–0.2)
EOS (ABSOLUTE): 0.4 10*3/uL (ref 0.0–0.4)
Eos: 5 %
Hematocrit: 41.4 % (ref 37.5–51.0)
Hemoglobin: 14.1 g/dL (ref 13.0–17.7)
IMMATURE GRANS (ABS): 0.2 10*3/uL — AB (ref 0.0–0.1)
IMMATURE GRANULOCYTES: 2 %
LYMPHS: 21 %
Lymphocytes Absolute: 1.9 10*3/uL (ref 0.7–3.1)
MCH: 29.9 pg (ref 26.6–33.0)
MCHC: 34.1 g/dL (ref 31.5–35.7)
MCV: 88 fL (ref 79–97)
Monocytes Absolute: 1 10*3/uL — ABNORMAL HIGH (ref 0.1–0.9)
Monocytes: 11 %
NEUTROS PCT: 60 %
Neutrophils Absolute: 5.4 10*3/uL (ref 1.4–7.0)
PLATELETS: 195 10*3/uL (ref 150–450)
RBC: 4.71 x10E6/uL (ref 4.14–5.80)
RDW: 17.5 % — AB (ref 12.3–15.4)
WBC: 9.1 10*3/uL (ref 3.4–10.8)

## 2017-10-21 LAB — BASIC METABOLIC PANEL
BUN/Creatinine Ratio: 28 — ABNORMAL HIGH (ref 10–24)
BUN: 33 mg/dL — ABNORMAL HIGH (ref 8–27)
CALCIUM: 9.6 mg/dL (ref 8.6–10.2)
CO2: 27 mmol/L (ref 20–29)
CREATININE: 1.17 mg/dL (ref 0.76–1.27)
Chloride: 94 mmol/L — ABNORMAL LOW (ref 96–106)
GFR calc Af Amer: 68 mL/min/{1.73_m2} (ref >59)
GFR calc non Af Amer: 59 mL/min/{1.73_m2} — ABNORMAL LOW (ref >59)
GLUCOSE: 100 mg/dL — AB (ref 65–99)
POTASSIUM: 4.9 mmol/L (ref 3.5–5.2)
Sodium: 137 mmol/L (ref 134–144)

## 2017-10-21 NOTE — Telephone Encounter (Signed)
Called patient unable to reach left message to give us a call back.

## 2017-10-22 NOTE — Telephone Encounter (Signed)
Notes recorded by Brand Males, MD on 10/21/2017 at 12:43 PM EDT aspergillu s panel -psitive. Will see him 10/24/17/. Dx is ABPA ------------------------------------- Attempted to call pt. I did not receive an answer. I have left a message for pt to return our call.

## 2017-10-23 NOTE — Telephone Encounter (Signed)
Attempted to call pt. I did not receive an answer. I have left a message for pt to return our call.  

## 2017-10-24 ENCOUNTER — Ambulatory Visit (INDEPENDENT_AMBULATORY_CARE_PROVIDER_SITE_OTHER): Payer: Medicare Other | Admitting: Internal Medicine

## 2017-10-24 ENCOUNTER — Encounter: Payer: Self-pay | Admitting: Internal Medicine

## 2017-10-24 VITALS — BP 100/82 | HR 70 | Ht 69.5 in | Wt 122.8 lb

## 2017-10-24 DIAGNOSIS — Z9109 Other allergy status, other than to drugs and biological substances: Secondary | ICD-10-CM

## 2017-10-24 DIAGNOSIS — Z23 Encounter for immunization: Secondary | ICD-10-CM

## 2017-10-24 DIAGNOSIS — R768 Other specified abnormal immunological findings in serum: Secondary | ICD-10-CM

## 2017-10-24 DIAGNOSIS — I2510 Atherosclerotic heart disease of native coronary artery without angina pectoris: Secondary | ICD-10-CM

## 2017-10-24 DIAGNOSIS — D721 Eosinophilia, unspecified: Secondary | ICD-10-CM

## 2017-10-24 DIAGNOSIS — J4540 Moderate persistent asthma, uncomplicated: Secondary | ICD-10-CM | POA: Diagnosis not present

## 2017-10-24 LAB — NITRIC OXIDE: NITRIC OXIDE: 39

## 2017-10-24 NOTE — Telephone Encounter (Signed)
Patient was seen for an OV today, 10/24/17 at 9:15 with MR and the results were stated to him at that visit. Will close this open encounter.

## 2017-10-24 NOTE — Patient Instructions (Addendum)
ICD-10-CM   1. Moderate persistent extrinsic asthma without complication H82.99   2. Elevated IgE level R76.8   3. Allergy to mold spores Z91.09   4. Multiple environmental allergies Z91.09   5. Eosinophilia D72.1    Your moderate persistent asthma because of activation of IgE pathway and eosinophils This is being driven by multiple environmental allergies and also allergies to mold spores You have been exposed to mold spores in the past and probably have ongoing mold exposure possibly in the home  Glad you are better with Brio; therefore we have given your diagnosis of allergic cough variant asthma  Plan -Do exam nitric oxide test today -High-dose flu shot today -= Continue Breo daily -Continue allergy shots through Dr. Donneta Romberg -  you are at risk for a condition called ABPA because of mold allergies  -This is a complication of asthma because of mold allergy sensitivity and has the potential for damage lung tissue over time  -Currently because you are doing well on Breo we do not have to do anything else at this point in terms of therapy  - -Please let Dr. Donneta Romberg know the above  -Recommend avoiding gardening and ensuring home has no mold exposure or you do not work with damp products and do not use a feather pillow or blanket  Follow-up -6 months or sooner; exhaled nitric oxide testing at follow-up

## 2017-10-24 NOTE — Progress Notes (Signed)
IOV 07/31/2017   Chief Complaint  Patient presents with  . Consult    Referred by [name] [name] due to pericardial effusion.  Pt has had a cardiac cath performed 5/24 and also a thoracentesis performed 5/15. Pt states he has a lot of phlegm that is white in color.    [name] [name] , 82 y.o. , with dob [birth year] and male ,Not Hispanic or Latino from [place] [address] - presents to lung  clinic for evaluation of pleuropericardial effusion associated with weight loss.history is gained from talking to him and review of the referring physician notes and old chart. It is a very complicated complex history. As best as I can tell in May 2018 he underwent coronary artery stent RCA and in June 2018 and underwent TAVR rom severe aortic stenosis. He was known to have had chronic pericardial effusion even at the time off TAVR in June 2018. He tells me starting fall 2018he started noticing weight loss and cough and was? Admitted for pneumonia. After that he lost30-40 pounds. These problems persisted through the spring of 2019 when they settled. In the midst of this in February 2019 he underwent a CT scan of the chestthat I personally visualized and shows groundglass opacities with lung nodules but no pleural effusion at this time. He had a follow-up CT chest in May 2019 at which time the cough had resolved. Concomitant with the resolution of the cough is lung nodules and ground glass opacities have resolved but he had new onset bilateral pleural effusion. He then had left thoracentesis which she says was 1 L middle May 2019which I review the results and shows a transudate. He had a follow-up chest x-ray that showed resolution of the  Pleural effusion. After that he has not had any further radiologic imaging of the chest. 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 He underwent  echocardiogram a few days ago July 2019 as follow-up and the pericardial effusion has resolved. Since May 2019 he does not have the cough the weight loss has stabilized. However he has not been able to gain weight. He is now able to work 20 minutes in the yard but then after that feels extremely exhausted that he has to go to the bedhe is not happy with this outcome he wants to be able to do more but he does admit that this is better than 8 months ago and even a few months ago.He has been referred here because of the constellation of weight loss in the pericardial and pleural effusions.  He denies any other stigmata of autoimmune vasculitis disease including malar rash or photophobia or oral ulcers. He does have easy bruising but he is on antiplatelet therapy   Results for [name] [name], [name] [name] (MRN [id number]) as of 07/31/2017 09:25  Ref. Range 05/08/2017 10:55  Anti Nuclear Antibody(ANA) Latest Ref Range: Negative  Negative  RA Latex Turbid. Latest Ref Range: 0.0 - 13.9 IU/mL <10.0   Most recent blood labs in 11.3 g percent May 2019, sedimentation rate 58 and April 2019nd creatinine 0.9 mg percent June 2019     OV 09/19/2017  Subjective:  Patient ID: [name] [name], male , DOB: 1935/12/10 , age 10 y.o. , MRN: [id number] , ADDRESS: [address] [name] [id number]   09/19/2017 -   Chief Complaint  Patient presents with  . Follow-up  breathing still the same, cough with white/gray color      HPI [name] 82 y.o. -followup for unusual constellation of    ICD-10-CM   1. Chronic fatigue R53.82   2. Weight loss, non-intentional R63.4   3. Dyspnea on exertion R06.09   4. Physical deconditioning R53.81   5. History of pleural effusion Z87.09   6. Hx peridcardial effsion  - He says his last follow-up visit weight loss. But he has not gained any weight. He works with physical therapy and he does bike exercise and he stopped because of proximal muscle cramps and pain but his CK was  normal in July 2029 he says he lost all his weight after the pneumonia including mostly muscle mass. He says he is trying to regain it but not able to. There no history of muscular fasciculations. He also get short of breath although I'm unable to differentiate if this is fatigue or shortness of breath particularly with exertion. Chest x-ray July 2019 did not show any obvious recurrence of pericardial or pleural effusion.His last CT chest was in February 2019       Simple office walk 185 feet x  3 laps goal with forehead probe 07/31/2017   O2 used Room air  Number laps completed 3  Comments about pace normal  Resting Pulse Ox/HR 97% and 69/min  Final Pulse Ox/HR 100% and 64/min  Desaturated </= 88% no  Desaturated <= 3% points no  Got Tachycardic >/= 90/min no  Symptoms at end of test Not reported  Miscellaneous comments none     ROS OV 10/10/2017  Subjective:  Patient ID: [name] [name], male , DOB: [birth year] , age 45 y.o. , MRN: [id number] , ADDRESS: [address] [place] [address]   10/10/2017 -   Chief Complaint  Patient presents with  . Follow-up    HRCT performed 9/16.  Pt states he feels like he has become worse since last visit. States the cough has become worse and he still has SOB. Pt wonders if the pna is coming back.      HPI [name] [name] 82 y.o. -presents for follow-up of his symptoms of constellation of weight loss physical deconditioning shortness of breath and cough.  After last visit we had him undergo a high-resolution CT chest that he did September 29, 2017.  Previous issues of pericardial effusion or thickening and pleural effusion have resolved on the CT chest that I personally visualized.  His lung parenchyma looks pretty healthy.  There is no evidence of interstitial lung disease.  There might be some mild bronchiectasis and some small nodules per radiology.  None of this explain his symptomatology.  He tells me he continues to have shortness of  breath with exertion although this could also be fatigue and deconditioning.     He also reports a chronic cough for the last several months.  He is reporting this to me for the first time.  Therefore it is a new complaint.  Happens mostly at night and when he lies down.  He has some white sputum.  He thinks it is sinus at drainage and allergy related.  He has been advised to do sinus cleansing by his allergist.  He is not on inhalers.  We also did autoimmune and vasculitis panel and it was negative.  This documented below.  His CK in June 2019 was normal.  His exhaled nitric oxide -today in our office- significantly elevated at 101 ppb  Results for [name] [name], [name] [name] (MRN [id number]) as of 10/10/2017 10:43  Ref. Range 07/31/2017 10:07  Anti Nuclear Antibody(ANA) Latest Ref Range: NEGATIVE  NEGATIVE  ANCA Proteinase 3 Latest Ref Range: 0.0 - 3.5 U/mL <3.5  Angiotensin-Converting Enzyme Latest Ref Range: 9 - 67 U/L 24  Cyclic Citrullin Peptide Ab Latest Units: UNITS <16  ds DNA Ab Latest Units: IU/mL <1  Myeloperoxidase Ab Latest Ref Range: 0.0 - 9.0 U/mL <9.0  RA Latex Turbid. Latest Ref Range: <14 IU/mL <14  Ribonucleic Protein(ENA) Antibody, IgG Latest Ref Range: <1.0 NEG AI <1.0 NEG  SSA (Ro) (ENA) Antibody, IgG Latest Ref Range: <1.0 NEG AI <1.0 NEG  SSB (La) (ENA) Antibody, IgG Latest Ref Range: <1.0 NEG AI <1.0 NEG  Scleroderma (Scl-70) (ENA) Antibody, IgG Latest Ref Range: <1.0 NEG AI <1.0 NEG    OV 10/24/2017  Subjective:  Patient ID: [name] [name], male , DOB: 07/21/1935 , age 51 y.o. , MRN: [id number] , ADDRESS: [address] [name] Gary [facility] [address]   10/24/2017 -   Chief Complaint  Patient presents with  . Follow-up    Doing better cough is almost gone, had to go to the hospital and monday for a Cardio Adverstion.     HPI [name] [name] 82 y.o. -follow-up cough variant asthma with exam nitric oxide being high.  Last visit we started him on Breo.  He is reporting for  follow-up.  He tells [place] that the cough is significantly improved.  It is gone from a scale of 5->1.  In association with this is nitric oxide test is improved from 100 ppb last visit to 39 ppb today.  He had reported a strong history of seasonal and environmental allergies for over 30 years.  He follows with Dr. Donneta Romberg.  He is on allergy shots.  Therefore we did a blood test for eosinophilia and blood allergy profile.  The results were abnormal significantly.  His IgE is significantly elevated in the ABPA range.  His aspergillus precipitants are all positive.  However his recent CT scan is essentially normal except for some mild bronchiectasis.  Therefore he is at risk for clinical ABPA although he would qualify for a diagnosis of serological ABPA at this point.  In talking to him he initially said he did not have any mold exposure but it appears up until recently may be a year or 2 ago he was gardening quite heavily in a damp environment.  He has his house more checked and apparently within the last 2 years and it was clean.  He is not sure if he uses a feather pillow.  He does not play wind instruments.  There is occasional mildew in the shower curtain.   Results for [name] [name], [name] [name] (MRN [id number]) as of 10/24/2017 09:26  Ref. Range 08/07/2006 11:07 12/01/2016 14:24 04/23/2017 13:41 06/04/2017 11:36 10/10/2017 11:38 10/21/2017 08:22  EOS (ABSOLUTE) Latest Ref Range: 0.0 - 0.4 x10E3/uL    0.2  0.4   Results for [name] [name], [name] [name] (MRN [id number]) as of 10/24/2017 09:26  Ref. Range 10/15/2017 10:08  Aspergillus fumigatus IgE Latest Ref Range: <0.35 kU/L 2.67 (H)  A. Fumigatus Class Interp Unknown 2  Aspergillus [place] IgE Latest Ref Range: <0.35 kU/L 3.35 (H)  A. [place] Class Interp Unknown 2  Aspergillus Versicolor IgE Latest Ref Range: <0.35 kU/L 0.63 (H)  A. Versicolor Class Interp Unknown 1  Aspergillus amstel/glaucu IgE* Latest Ref Range: <0.35 kU/L <0.35  A. Amstel/Glaucu Class Interp Unknown 0  Aspergillus flavus IgE Latest Ref Range: <0.35 kU/L <0.35  A. Flavus Class Interp Unknown 0  Aspergillus nidulans IgE Latest Ref Range: <0.35 kU/L <0.35  A. Nidulans Class Interp Unknown 0  Aspergillus Terreus IgE Latest Ref Range: <0.35 kU/L <0.35  A. Terreus Class Interp Unknown 0  Results for [name] [name], [name] [name] (MRN [id number]) as of 10/24/2017 09:26  Ref. Range 10/15/2017 10:08 10/15/2017 10:27  Sheep Sorrel IgE Latest Units: kU/L 0.11 (H)   Pecan/Hickory Tree IgE Latest Units: kU/L 0.16 (H)   IgE (Immunoglobulin E), Serum Latest Ref Range: <OR=114 kU/L 2,897 (H)   Allergen, D pternoyssinus,d7 Latest Units: kU/L 0.58 (H)   Cat Dander Latest Units: kU/L 4.29 (H)   Dog Dander Latest Units: kU/L 0.30 (H)   [place] Grass Latest Units: kU/L 2.10 (H)   Johnson Grass Latest Units: kU/L 1.30 (H)   Timothy Grass Latest Units: kU/L 4.30 (H)   Cockroach Latest Units: kU/L 0.13 (H)   Aspergillus fumigatus, m3 Latest Units: kU/L 3.05 (H) 2.81 (H)  Allergen, Comm Silver Wendee Copp, t9 Latest Units: kU/L 0.11 (H)   Allergen, Cottonwood, t14 Latest Units: kU/L 0.19 (H)   Elm IgE Latest Units: kU/L 0.18 (H)   Allergen, Mulberry, t76 Latest Units: kU/L <0.10   Allergen, Oak,t7 Latest Units: kU/L 0.11 (H)   COMMON RAGWEED (SHORT) (W1) IGE Latest Units: kU/L 0.43 (H)   Allergen, Mouse Urine Protein, e78 Latest Units: kU/L <0.10   D. farinae Latest Units: kU/L 0.66 (H)   Allergen, Cedar tree, t12 Latest Units: kU/L 0.13 (H)   Box Elder IgE Latest Units: kU/L 0.17 (H)   Rough Pigweed  IgE Latest Units: kU/L 0.12 (H)   A-1 Antitrypsin, Ser Latest Ref Range: 83 - 199 mg/dL 159    Results for [name] [name], [name] [name] (MRN [id number]) as of 10/24/2017 09:26  Ref. 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), BPH (benign prostatic hypertrophy), Chronic diastolic CHF (congestive heart failure) ([place]) (10/16/2017), Coronary artery disease involving native coronary artery of native heart without angina pectoris, Dilated aortic root ([place]), Glaucoma, H/O seasonal allergies, adenomatous colonic polyps (1991 on), cardiovascular stress test, Hyperlipidemia, Mitral valve prolapse, Multifocal pneumonia (12/01/2016), New onset atrial fibrillation ([place]) (04/23/2017), OA (osteoarthritis) of hip (10/16/2016), PAF (paroxysmal atrial fibrillation) ([place]), Paresthesia, Pericardial effusion (11/05/2016), Postural dizziness with presyncope (04/23/2017), Pulmonary nodules, Right heart failure ([place]), and S/P TAVR (transcatheter aortic valve replacement).   reports that he quit smoking about 57 years ago. His smoking use included cigarettes. He has a 1.00 pack-year smoking history. He has never used smokeless tobacco.  Past Surgical History:  Procedure Laterality Date  . COLONOSCOPY  multiple  . CORONARY STENT INTERVENTION N/A 06/06/2016   Procedure: Coronary Stent Intervention;  Surgeon: [name], MD;  Location: [facility];  Service: Cardiovascular;  Laterality: N/A;  . CYSTOSCOPY  08/12/2016   per patient; showed a cyst on bladder , liver , and kidney , sees urologist Matilde Bash [facility] at [facility]  . EYE SURGERY     Bilateral cataract removal  . INTRAVASCULAR PRESSURE WIRE/FFR STUDY N/A 05/29/2016   Procedure: Intravascular Pressure Wire/FFR Study;  Surgeon: [name], MD;  Location: [facility];  Service: Cardiovascular;  Laterality: N/A;  . IR THORACENTESIS ASP PLEURAL SPACE W/IMG GUIDE  05/28/2017  . RIGHT/LEFT HEART CATH AND CORONARY ANGIOGRAPHY N/A 05/29/2016   Procedure: Right/Left Heart Cath and Coronary Angiography;  Surgeon: [name], MD;  Location: [facility];  Service:  Cardiovascular;  Laterality: N/A;  . RIGHT/LEFT HEART CATH AND CORONARY ANGIOGRAPHY N/A 06/06/2017   Procedure: RIGHT/LEFT HEART CATH AND CORONARY ANGIOGRAPHY;  Surgeon: Larey Dresser, MD;  Location: [facility];  Service: Cardiovascular;  Laterality: N/A;  . TEE WITHOUT CARDIOVERSION N/A 07/02/2016   Procedure: TRANSESOPHAGEAL ECHOCARDIOGRAM (TEE);  Surgeon: [name], MD;  Location: [facility];  Service: Open Heart Surgery;  Laterality: N/A;  . TONSILLECTOMY  1949  . TOTAL HIP ARTHROPLASTY Left 10/16/2016   Procedure: LEFT TOTAL HIP ARTHROPLASTY ANTERIOR APPROACH;  Surgeon: Gaynelle Arabian, MD;  Location: [facility];  Service: Orthopedics;  Laterality: Left;  . TRANSCATHETER AORTIC VALVE REPLACEMENT, TRANSFEMORAL N/A 07/02/2016   Procedure: TRANSCATHETER AORTIC VALVE REPLACEMENT, TRANSFEMORAL;  Surgeon: [name], MD;  Location: [facility];  Service: Open Heart Surgery;  Laterality: N/A;    No Known Allergies  Immunization History  Administered Date(s) Administered  . Influenza, High Dose Seasonal PF 10/17/2016    Family History  Problem Relation Age of Onset  . Emphysema Mother   . Pancreatic cancer Father   . Heart attack Brother   . Heart disease Brother   . Colon cancer Neg Hx   . Stomach cancer Neg Hx      Current Outpatient Medications:  .  albuterol (PROVENTIL HFA;VENTOLIN HFA) 108 (90 Base) MCG/ACT inhaler, Inhale 2 puffs into the lungs every 6 (six) hours as needed for wheezing or shortness of breath., Disp: 1 Inhaler, Rfl: 6 .  amoxicillin (AMOXIL) 500 MG capsule, TAKE 4 CAPSULES ONE HOUR PRIOR TO DENTAL PROCEDURE. (Patient taking differently: Take 2,000 mg by mouth See admin instructions. Take 4 capsules (2000 mg) by mouth 1 hour prior to dental procedures), Disp: 8 capsule, Rfl: 0 .  apixaban (ELIQUIS) 5 MG TABS tablet, Take 1 tablet (5 mg total) by mouth 2 (two) times daily., Disp: 180 tablet, Rfl: 3 .  azelastine (ASTELIN) 0.1 % nasal spray, Place 1 spray into  both nostrils 2 (two) times daily as needed (hay fever). Use in each nostril as directed, Disp: , Rfl:  .  colchicine 0.6 MG tablet, Take 1 tablet (0.6 mg total) by mouth 2 (two) times daily., Disp: 180 tablet, Rfl: 3 .  feeding supplement, ENSURE ENLIVE, (ENSURE ENLIVE) LIQD, Take 237 mLs by mouth daily. (Patient taking differently: Take 237 mLs by mouth every other day. ), Disp: 30 Bottle, Rfl: 0 .  fluticasone (FLONASE) 50 MCG/ACT nasal spray, Place 1 spray into both nostrils at bedtime as needed for allergies. , Disp: , Rfl:  .  fluticasone furoate-vilanterol (BREO ELLIPTA) 100-25 MCG/INH AEPB, Inhale 1 puff into the lungs daily., Disp: 28 each, Rfl: 11 .  furosemide (LASIX) 20 MG tablet, Take 2 tablets (40 mg total) by mouth 2 (two) times daily., Disp: 120 tablet, Rfl: 6 .  latanoprost (XALATAN) 0.005 % ophthalmic solution, Place 1 drop into both eyes at bedtime., Disp: , Rfl:  .  pravastatin (PRAVACHOL) 40 MG tablet, Take 40 mg by mouth at bedtime. , Disp: , Rfl:  .  timolol (BETIMOL) 0.5 % ophthalmic solution, Place 1 drop into both eyes 2 (two) times daily., Disp: , Rfl:       Objective:  Vitals:   10/24/17 0920  BP: 100/82  Pulse: 70  SpO2: 100%  Weight: 122 lb 12.8 oz (55.7 kg)  Height: 5' 9.5" (1.765 m)    Estimated body mass index is 17.87 kg/m as calculated from the following:   Height as of this encounter: 5' 9.5" (1.765 m).   Weight as of this encounter: 122 lb 12.8 oz (55.7 kg).  @WEIGHTCHANGE @  [employer]   10/24/17 0920  Weight: 122 lb 12.8 oz (55.7 kg)     Physical Exam  General Appearance:    Alert, cooperative, no distress, appears stated age - yes , Deconditioned looking - mild but beter , OBESE  - no, Sitting on Wheelchair -  no  Head:    Normocephalic, without obvious abnormality, atraumatic  Eyes:    PERRL, conjunctiva/corneas clear,  Ears:    Normal TM's and external ear canals, both ears  Nose:   Nares normal, septum midline, mucosa normal, no  drainage    or sinus tenderness. OXYGEN ON  - no . Patient is @ ra   Throat:   Lips, mucosa, and tongue normal; teeth and gums normal. Cyanosis on lips - no  Neck:   Supple, symmetrical, trachea midline, no adenopathy;    thyroid:  no enlargement/tenderness/nodules; no carotid   bruit or JVD  Back:     Symmetric, no curvature, ROM normal, no CVA tenderness  Lungs:     Distress - no , Wheeze no, Barrell Chest - no, Purse lip breathing - no, Crackles - no   Chest Wall:    No tenderness or deformity.    Heart:    Regular rate and rhythm, S1 and S2 normal, no rub   or gallop, Murmur - no  Breast Exam:    NOT DONE  Abdomen:     Soft, non-tender, bowel sounds active all four quadrants,    no masses, no organomegaly. Visceral obesity - no  Genitalia:   NOT DONE  Rectal:   NOT DONE  Extremities:   Extremities - normal, Has Cane - no, Clubbing - no, Edema - no  Pulses:   2+ and symmetric all extremities  Skin:   Stigmata of Connective Tissue Disease - no  Lymph nodes:   Cervical, supraclavicular, and axillary nodes normal  Psychiatric:  Neurologic:   Pleasant - yes, Anxious - no, Flat affect - no  CAm-ICU - neg, Alert and Oriented x 3 - yes, Moves all 4s - yes, Speech - normal, Cognition - intact           Assessment:       ICD-10-CM   1. Moderate persistent extrinsic asthma without complication Z66.06   2. Elevated IgE level R76.8   3. Allergy to mold spores Z91.09   4. Multiple environmental allergies Z91.09   5. Eosinophilia D72.1        Plan:     Patient Instructions     ICD-10-CM   1. Moderate persistent extrinsic asthma without complication T01.60   2. Elevated IgE level R76.8   3. Allergy to mold spores Z91.09   4. Multiple environmental allergies Z91.09   5. Eosinophilia D72.1    Your moderate persistent asthma because of activation of IgE pathway and eosinophils This is being driven by multiple environmental allergies and also allergies to mold spores You have  been exposed to mold spores in the past and probably have ongoing mold exposure possibly in the home  Glad you are better with Brio; therefore we  have given your diagnosis of allergic cough variant asthma  Plan -Do exam nitric oxide test today -High-dose flu shot today -= Continue Breo daily -Continue allergy shots through Dr. Donneta Romberg -  you are at risk for a condition called ABPA because of mold allergies  -This is a complication of asthma because of mold allergy sensitivity and has the potential for damage lung tissue over time  -Currently because you are doing well on Breo we do not have to do anything else at this point in terms of therapy  - -Please let Dr. Donneta Romberg know the above  -Recommend avoiding gardening and ensuring home has no mold exposure or you do not work with damp products and do not use a feather pillow or blanket  Follow-up -6 months or sooner; exhaled nitric oxide testing at follow-up    > 50% of this > 25 min visit spent in face to face counseling or coordination of care - by this undersigned MD - Dr Brand Males. This includes one or more of the following documented above: discussion of test results, diagnostic or treatment recommendations, prognosis, risks and benefits of management options, instructions, education, compliance or risk-factor reduction     SIGNATURE    Dr. Brand Males, M.D., F.C.C.P,  Pulmonary and Critical Care Medicine Staff Physician, [facility] Director - Interstitial Lung Disease  Program  Pulmonary [place] [facility] [place], [name], [address]  Pager: [phone], If no answer or between  15:00h - 7:00h: call [phone] Telephone: [phone]  9:48 AM 10/24/2017

## 2017-10-24 NOTE — Addendum Note (Signed)
Addended by: Shirlee More R on: 10/24/2017 10:57 AM   Modules accepted: Orders

## 2017-10-25 ENCOUNTER — Other Ambulatory Visit: Payer: Self-pay | Admitting: Cardiology

## 2017-10-25 DIAGNOSIS — I480 Paroxysmal atrial fibrillation: Secondary | ICD-10-CM

## 2017-10-26 NOTE — Anesthesia Preprocedure Evaluation (Addendum)
Anesthesia Evaluation  Patient identified by MRN, date of birth, ID band Patient awake    Reviewed: Allergy & Precautions, NPO status , Patient's Chart, lab work & pertinent test results  History of Anesthesia Complications Negative for: history of anesthetic complications  Airway Mallampati: II  TM Distance: >3 FB Neck ROM: Full    Dental  (+) Dental Advisory Given, Teeth Intact   Pulmonary former smoker,    breath sounds clear to auscultation       Cardiovascular hypertension, + CAD, + Cardiac Stents and +CHF  + dysrhythmias Atrial Fibrillation  Rhythm:Regular Rate:Normal   S/p TAVR 2018  '19 TTE - Moderate LVH. EF 60% to 65%. Bioprosthetic AV present. LA moderately dilated. RA moderately to severely dilated. PASP was mildly increased. PA peak pressure: 34 mm Hg   '19 Cath - 1. Mildly elevated right and left heart filling pressures.  2. Moderate pulmonary venous hypertension.  3. Marginal cardiac output.  4. There was not hemodynamic evidence for constrictive pericarditis present (simultaneous LV and RV pressures tracings were concordant).  5. Minimal pressure gradient across bioprosthetic aortic valve (s/p TAVR).  6. Patent RCA stent.  70% mid LAD stenosis, no change from 5/18 cath.  At that time, FFR was negative.    Neuro/Psych negative neurological ROS  negative psych ROS   GI/Hepatic negative GI ROS, Neg liver ROS,   Endo/Other  negative endocrine ROS  Renal/GU negative Renal ROS    BPH     Musculoskeletal  (+) Arthritis ,   Abdominal   Peds  Hematology negative hematology ROS (+)   Anesthesia Other Findings   Reproductive/Obstetrics                            Anesthesia Physical Anesthesia Plan  ASA: III  Anesthesia Plan: General   Post-op Pain Management:    Induction: Intravenous  PONV Risk Score and Plan: 2 and Treatment may vary due to age or medical condition  and Propofol infusion  Airway Management Planned: Natural Airway and Mask  Additional Equipment: None  Intra-op Plan:   Post-operative Plan:   Informed Consent: I have reviewed the patients History and Physical, chart, labs and discussed the procedure including the risks, benefits and alternatives for the proposed anesthesia with the patient or authorized representative who has indicated his/her understanding and acceptance.   Dental advisory given  Plan Discussed with: CRNA and Anesthesiologist  Anesthesia Plan Comments:        Anesthesia Quick Evaluation

## 2017-10-27 ENCOUNTER — Ambulatory Visit (HOSPITAL_COMMUNITY): Payer: Medicare Other | Admitting: Anesthesiology

## 2017-10-27 ENCOUNTER — Encounter (HOSPITAL_COMMUNITY)
Admission: RE | Disposition: A | Discharge status: Home / Self Care | Payer: Self-pay | Source: Ambulatory Visit | Attending: Cardiovascular Disease

## 2017-10-27 ENCOUNTER — Encounter (HOSPITAL_COMMUNITY): Payer: Self-pay | Admitting: *Deleted

## 2017-10-27 ENCOUNTER — Ambulatory Visit (HOSPITAL_COMMUNITY)
Admission: RE | Admit: 2017-10-27 | Discharge: 2017-10-27 | Disposition: A | Discharge status: Home / Self Care | Payer: Medicare Other | Source: Ambulatory Visit | Attending: Cardiovascular Disease | Admitting: Cardiovascular Disease

## 2017-10-27 DIAGNOSIS — I110 Hypertensive heart disease with heart failure: Secondary | ICD-10-CM | POA: Diagnosis not present

## 2017-10-27 DIAGNOSIS — I2510 Atherosclerotic heart disease of native coronary artery without angina pectoris: Secondary | ICD-10-CM | POA: Insufficient documentation

## 2017-10-27 DIAGNOSIS — Z87891 Personal history of nicotine dependence: Secondary | ICD-10-CM | POA: Insufficient documentation

## 2017-10-27 DIAGNOSIS — Z952 Presence of prosthetic heart valve: Secondary | ICD-10-CM | POA: Insufficient documentation

## 2017-10-27 DIAGNOSIS — Z7982 Long term (current) use of aspirin: Secondary | ICD-10-CM | POA: Insufficient documentation

## 2017-10-27 DIAGNOSIS — I5032 Chronic diastolic (congestive) heart failure: Secondary | ICD-10-CM | POA: Insufficient documentation

## 2017-10-27 DIAGNOSIS — I350 Nonrheumatic aortic (valve) stenosis: Secondary | ICD-10-CM | POA: Diagnosis not present

## 2017-10-27 DIAGNOSIS — I4892 Unspecified atrial flutter: Secondary | ICD-10-CM | POA: Diagnosis not present

## 2017-10-27 DIAGNOSIS — I480 Paroxysmal atrial fibrillation: Secondary | ICD-10-CM | POA: Insufficient documentation

## 2017-10-27 DIAGNOSIS — Z955 Presence of coronary angioplasty implant and graft: Secondary | ICD-10-CM | POA: Diagnosis not present

## 2017-10-27 DIAGNOSIS — Z8249 Family history of ischemic heart disease and other diseases of the circulatory system: Secondary | ICD-10-CM | POA: Diagnosis not present

## 2017-10-27 DIAGNOSIS — H409 Unspecified glaucoma: Secondary | ICD-10-CM | POA: Diagnosis not present

## 2017-10-27 DIAGNOSIS — Z7951 Long term (current) use of inhaled steroids: Secondary | ICD-10-CM | POA: Diagnosis not present

## 2017-10-27 DIAGNOSIS — E7800 Pure hypercholesterolemia, unspecified: Secondary | ICD-10-CM | POA: Diagnosis not present

## 2017-10-27 DIAGNOSIS — Z7901 Long term (current) use of anticoagulants: Secondary | ICD-10-CM | POA: Insufficient documentation

## 2017-10-27 DIAGNOSIS — I5082 Biventricular heart failure: Secondary | ICD-10-CM | POA: Diagnosis not present

## 2017-10-27 DIAGNOSIS — I10 Essential (primary) hypertension: Secondary | ICD-10-CM | POA: Diagnosis not present

## 2017-10-27 DIAGNOSIS — E785 Hyperlipidemia, unspecified: Secondary | ICD-10-CM | POA: Diagnosis not present

## 2017-10-27 HISTORY — PX: CARDIOVERSION: SHX1299

## 2017-10-27 SURGERY — CARDIOVERSION
Anesthesia: General

## 2017-10-27 MED ORDER — PROPOFOL 10 MG/ML IV BOLUS
INTRAVENOUS | Status: DC | PRN
  Administered 2017-10-27: 20 mg via INTRAVENOUS
  Administered 2017-10-27: 40 mg via INTRAVENOUS

## 2017-10-27 MED ORDER — LIDOCAINE 2% (20 MG/ML) 5 ML SYRINGE
INTRAMUSCULAR | Status: DC | PRN
  Administered 2017-10-27: 60 mg via INTRAVENOUS

## 2017-10-27 MED ORDER — SODIUM CHLORIDE 0.9 % IV SOLN
INTRAVENOUS | Status: DC
  Administered 2017-10-27: 07:00:00 via INTRAVENOUS

## 2017-10-27 NOTE — Anesthesia Postprocedure Evaluation (Signed)
Anesthesia Post Note  Patient: [name] [name]  Procedure(s) Performed: CARDIOVERSION (N/A )     Patient location during evaluation: PACU Anesthesia Type: General Level of consciousness: awake and alert Pain management: pain level controlled Vital Signs Assessment: post-procedure vital signs reviewed and stable Respiratory status: spontaneous breathing, nonlabored ventilation and respiratory function stable Cardiovascular status: blood pressure returned to baseline and stable Postop Assessment: no apparent nausea or vomiting Anesthetic complications: no    Last Vitals:  Vitals:   10/27/17 0820 10/27/17 0823  BP: (!) 96/52 (!) 117/55  Pulse:    Resp: 13 15  Temp:    SpO2: 100% 100%    Last Pain:  Vitals:   10/27/17 0823  TempSrc:   PainSc: 0-No pain                 Audry Pili

## 2017-10-27 NOTE — CV Procedure (Signed)
[place]: Anesthesia: Propofol On Rx anticoagulation with no missed doses  120 J biphasc x 1 Converted from atrial flutter rate 88 to NSR rate 61 bpm  No immediate neurologic sequelae  Jenkins Rouge

## 2017-10-27 NOTE — Transfer of Care (Signed)
Immediate Anesthesia Transfer of Care Note  Patient: [name] [name]  Procedure(s) Performed: CARDIOVERSION (N/A )  Patient Location: Endoscopy Unit  Anesthesia Type:General  Level of Consciousness: drowsy and patient cooperative  Airway & Oxygen Therapy: Patient Spontanous Breathing  Post-op Assessment: Report given to RN and Post -op Vital signs reviewed and stable  Post vital signs: Reviewed and stable  Last Vitals:  Vitals Value Taken Time  BP    Temp    Pulse    Resp    SpO2      Last Pain:  Vitals:   10/27/17 0651  TempSrc: Axillary  PainSc: 0-No pain         Complications: No apparent anesthesia complications

## 2017-10-27 NOTE — Discharge Instructions (Signed)
Electrical Cardioversion, Care After °This sheet gives you information about how to care for yourself after your procedure. Your health care provider may also give you more specific instructions. If you have problems or questions, contact your health care provider. °What can I expect after the procedure? °After the procedure, it is common to have: °· Some redness on the skin where the shocks were given. ° °Follow these instructions at home: °· Do not drive for 24 hours if you were given a medicine to help you relax (sedative). °· Take over-the-counter and prescription medicines only as told by your health care provider. °· Ask your health care provider how to check your pulse. Check it often. °· Rest for 48 hours after the procedure or as told by your health care provider. °· Avoid or limit your caffeine use as told by your health care provider. °Contact a health care provider if: °· You feel like your heart is beating too quickly or your pulse is not regular. °· You have a serious muscle cramp that does not go away. °Get help right away if: °· You have discomfort in your chest. °· You are dizzy or you feel faint. °· You have trouble breathing or you are short of breath. °· Your speech is slurred. °· You have trouble moving an arm or leg on one side of your body. °· Your fingers or toes turn cold or blue. °This information is not intended to replace advice given to you by your health care provider. Make sure you discuss any questions you have with your health care provider. °Document Released: 10/21/2012 Document Revised: 08/04/2015 Document Reviewed: 07/07/2015 °Elsevier Interactive Patient Education © 2018 [employer] ° °

## 2017-10-27 NOTE — Interval H&P Note (Signed)
History and Physical Interval Note:  10/27/2017 7:23 AM  [name] [name]  has presented today for surgery, with the diagnosis of aflutter  The various methods of treatment have been discussed with the patient and family. After consideration of risks, benefits and other options for treatment, the patient has consented to  Procedure(s): CARDIOVERSION (N/A) as a surgical intervention .  The patient's history has been reviewed, patient examined, no change in status, stable for surgery.  I have reviewed the patient's chart and labs.  Questions were answered to the patient's satisfaction.     Jenkins Rouge

## 2017-10-28 ENCOUNTER — Encounter (HOSPITAL_COMMUNITY): Payer: Self-pay | Admitting: Cardiovascular Disease

## 2017-10-28 DIAGNOSIS — L820 Inflamed seborrheic keratosis: Secondary | ICD-10-CM | POA: Diagnosis not present

## 2017-10-28 DIAGNOSIS — L814 Other melanin hyperpigmentation: Secondary | ICD-10-CM | POA: Diagnosis not present

## 2017-10-28 DIAGNOSIS — D692 Other nonthrombocytopenic purpura: Secondary | ICD-10-CM | POA: Diagnosis not present

## 2017-10-28 DIAGNOSIS — L821 Other seborrheic keratosis: Secondary | ICD-10-CM | POA: Diagnosis not present

## 2017-10-28 DIAGNOSIS — L570 Actinic keratosis: Secondary | ICD-10-CM | POA: Diagnosis not present

## 2017-11-03 NOTE — Progress Notes (Signed)
Cardiology Office Note   Date:  11/04/2017   ID:  [name] [name], DOB September 12, 1935, MRN [id number]  PCP:  [name] Dus, MD  Cardiologist: Dr. Radford Pax, MD   Chief Complaint  Patient presents with  . Atrial Fibrillation  . Coronary Artery Disease   History of Present Illness: [name] [name] is a 82 y.o. male who presents for post-DCCV follow-up performed on 10/27/17 per Dr. Johnsie [name], seen for Dr. Radford [name]. Pt has a PMH of dyslipidemia and family hx of CAD, bicuspid aortic valve with moderate AS which is noted to have progressed over the past several years and was referred to Dr. Burt [name] for evaluation of severe AS/TAVR. LHC showed non-obstructive CAD in the LAD and ramus with hemodynamically significant RCA stenosis by FFR and underwent a PCI of the RCA with DES on 06/06/16. He subsequently underwent TAVR 07/02/16. Per chart review, he has had issues with chronic pericardial effusion with no evidence of tamponade. He was admitted to the hospital 04/2017 with c/o if chest pain and syncope and was found to be in new onset atrial fibrillation. His chest pain was thought to be atypical in nature and likely musculoskeletal as well as pleuritic components. He was started on ibuprofen 600mg  three times daily along with Pepcid for 10 days. His Plavix was stopped at that time and it was recommended that he start Eliquis and that he be treated with antiinflammatories.  Per chart review, he has had recent c/o unintentional weight loss of approximately 40lbs over the past year with known pulmonary nodules on chest CT in 02/2017. He had a sed rate and CRP drawn which were elevated and he was ultimately sent to pulmonary medicine with Dr. Chase [name]. He began having symptoms of right heart failure with increased abdominal fullness, weight loss and lower extremity edema and underwent a R/LHC secondary to concern for constrictive pericarditis. R/L cath completed on 05/29/17 which showed elevated filling pressures  suggestive of diastolic CHF, moderate pulmonary hypertension, no evidence of constrictive pericarditis, minimal pressure gradient over prosthetic valve and patent RCA stent. He was noted to be in atrial flutter with rate control and plans were made for DCCV after adequate AC.   Pt underwent successful DCCV on 10/27/17 with 120J biphasic x1 from atrial flutter with a rate of 88 to NSR with a rate of 61bpm. He tolerated the procedure well. He was continued on Eliquis. He is on no rate controlling agents. Today he denies chest pain, palpitations, SOB, dizziness, presyncopal or syncopal symptoms. He reports that his fatigue has improved since his cardioversion and he is getting back to his baseline.    Past Medical History:  Diagnosis Date  . Actinic keratoses    Dr [name] [name]  . Allergic rhinitis   . Aortic stenosis    s/p TAVR 06/2016  . Arthritis   . Arthritis of left hip 11/03/2014  . Benign essential HTN   . Bicuspid aortic valve   . Bilateral cataracts   . 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 BPH (benign prostatic hypertrophy)   . Chronic diastolic CHF (congestive heart failure) ([place]) 10/16/2017  . Coronary artery disease involving native coronary artery of native heart without angina pectoris    PCI 06/06/16 LAD proximal 60 (FFR 0.85-negative); RI 50; LCx calcified without significant stenosis; RCA mid 80 (FFR 0.68-hemodynamically significant); PCI: 2.25 x 16 mm Synergy DES to the mid RCA  . Dilated aortic root ([name] [name])    70mm by echo 08/2015  . Glaucoma   .  H/O seasonal allergies   . Hx of adenomatous colonic polyps 1991 on  . Hx of cardiovascular stress test    Myoview (10/15):  normal  . Hyperlipidemia   . Mitral valve prolapse    mild MR by echo 08/2015  . Multifocal pneumonia 12/01/2016  . New onset atrial fibrillation ([name] [name]) 04/23/2017  . OA (osteoarthritis) of hip 10/16/2016  . PAF (paroxysmal atrial fibrillation) ([place])   . Paresthesia    Chronic left lateral thigh  . Pericardial  effusion 11/05/2016  . Postural dizziness with presyncope 04/23/2017  . Pulmonary nodules   . Right heart failure ([name] [name])   . S/P TAVR (transcatheter aortic valve replacement)     Past Surgical History:  Procedure Laterality Date  . CARDIOVERSION N/A 10/27/2017   Procedure: CARDIOVERSION;  Surgeon: Josue Hector, MD;  Location[facility] ENDOSCOPY;  Service: Cardiovascular;  Laterality: N/A;  . COLONOSCOPY  multiple  . CORONARY STENT INTERVENTION N/A 06/06/2016   Procedure: Coronary Stent Intervention;  Surgeon: [name], MD;  Location: [place] CV LAB;  Service: Cardiovascular;  Laterality: N/A;  . CYSTOSCOPY  08/12/2016   per patient; showed a cyst on bladder , liver , and kidney , sees urologist [name] [name] at [facility]  . EYE SURGERY     Bilateral cataract removal  . INTRAVASCULAR PRESSURE WIRE/FFR STUDY N/A 05/29/2016   Procedure: Intravascular Pressure Wire/FFR Study;  Surgeon: [name], MD;  Location: [place] CV LAB;  Service: Cardiovascular;  Laterality: N/A;  . IR THORACENTESIS ASP PLEURAL SPACE W/IMG GUIDE  05/28/2017  . RIGHT/LEFT HEART CATH AND CORONARY ANGIOGRAPHY N/A 05/29/2016   Procedure: Right/Left Heart Cath and Coronary Angiography;  Surgeon: [name], MD;  Location: [place] CV LAB;  Service: Cardiovascular;  Laterality: N/A;  . RIGHT/LEFT HEART CATH AND CORONARY ANGIOGRAPHY N/A 06/06/2017   Procedure: RIGHT/LEFT HEART CATH AND CORONARY ANGIOGRAPHY;  Surgeon: [name] Dresser, MD;  Location: [place] CV LAB;  Service: Cardiovascular;  Laterality: N/A;  . TEE WITHOUT CARDIOVERSION N/A 07/02/2016   Procedure: TRANSESOPHAGEAL ECHOCARDIOGRAM (TEE);  Surgeon: [name], MD;  Location: [place];  Service: Open Heart Surgery;  Laterality: N/A;  . TONSILLECTOMY  1949  . TOTAL HIP ARTHROPLASTY Left 10/16/2016   Procedure: LEFT TOTAL HIP ARTHROPLASTY ANTERIOR APPROACH;  Surgeon: [name] Arabian, MD;  Location: [facility] ORS;  Service: Orthopedics;  Laterality:  Left;  . TRANSCATHETER AORTIC VALVE REPLACEMENT, TRANSFEMORAL N/A 07/02/2016   Procedure: TRANSCATHETER AORTIC VALVE REPLACEMENT, TRANSFEMORAL;  Surgeon: [name], MD;  Location: [place];  Service: Open Heart Surgery;  Laterality: N/A;     Current Outpatient Medications  Medication Sig Dispense Refill  . apixaban (ELIQUIS) 5 MG TABS tablet Take 1 tablet (5 mg total) by mouth 2 (two) times daily. 180 tablet 3  . colchicine 0.6 MG tablet Take 1 tablet (0.6 mg total) by mouth 2 (two) times daily. 180 tablet 3  . feeding supplement, ENSURE ENLIVE, (ENSURE ENLIVE) LIQD Take 237 mLs by mouth daily. (Patient taking differently: Take 237 mLs by mouth every other day. ) 30 Bottle 0  . Feeding Supplies MISC by Does not apply route.    . fluticasone (FLONASE) 50 MCG/ACT nasal spray Place 1 spray into both nostrils at bedtime as needed for allergies.     . furosemide (LASIX) 20 MG tablet Take 2 tablets (40 mg total) by mouth 2 (two) times daily. 120 tablet 6  . latanoprost (XALATAN) 0.005 % ophthalmic solution Place 1 drop into both eyes at bedtime.    Marland Kitchen  pravastatin (PRAVACHOL) 40 MG tablet Take 40 mg by mouth at bedtime.     . timolol (BETIMOL) 0.5 % ophthalmic solution Place 1 drop into both eyes 2 (two) times daily.     No current facility-administered medications for this visit.     Allergies:   Patient has no known allergies.   Social History:  The patient  reports that he quit smoking about 57 years ago. His smoking use included cigarettes. He has a 1.00 pack-year smoking history. He has never used smokeless tobacco. He reports that he does not drink alcohol or use drugs.   Family History:  The patient's family history includes Emphysema in his mother; Heart attack in his brother; Heart disease in his brother; Pancreatic cancer in his father.    ROS:  Please see the history of present illness.   Otherwise, review of systems are positive for none.   All other systems are reviewed and  negative.    PHYSICAL EXAM: VS:  BP (!) 100/50   Pulse 71   Ht 5' 9.5" (1.765 m)   Wt 124 lb 12.8 oz (56.6 kg)   SpO2 95%   BMI 18.17 kg/m  , BMI Body mass index is 18.17 kg/m.    General: Well developed, well nourished, NAD Skin: Warm, dry, intact  Head: Normocephalic, atraumatic, sclera non-icteric, no xanthomas, clear, moist mucus membranes. Neck: Negative for carotid bruits. No JVD Lungs:Clear to ausculation bilaterally. No wheezes, rales, or rhonchi. Breathing is unlabored. Cardiovascular: RRR with S1 S2. No murmurs, rubs, gallops, or LV heave appreciated. Abdomen: Soft, non-tender, non-distended with normoactive bowel sounds. No hepatomegaly, No rebound/guarding. No obvious abdominal masses. MSK: Strength and tone appear normal for age. 5/5 in all extremities Extremities: No edema. No clubbing or cyanosis. DP/PT pulses 2+ bilaterally Neuro: Alert and oriented. No focal deficits. No facial asymmetry. MAE spontaneously. Psych: Responds to questions appropriately with normal affect.     EKG:  EKG is ordered today. The ekg ordered today demonstrates NSR with PAC's HR 71  Recent Labs: 11/05/2016: ALT 9 04/23/2017: Magnesium 2.0 05/16/2017: TSH 2.740 07/01/2017: NT-Pro BNP 2,182 10/21/2017: BUN 33; Creatinine, Ser 1.17; Hemoglobin 14.1; Platelets 195; Potassium 4.9; Sodium 137   Lipid Panel    Component Value Date/Time   CHOL 120 11/05/2016 0936   TRIG 81 11/05/2016 0936   HDL 39 (L) 11/05/2016 0936   CHOLHDL 3.1 11/05/2016 0936   LDLCALC 65 11/05/2016 0936      Wt Readings from Last 3 Encounters:  11/04/17 124 lb 12.8 oz (56.6 kg)  10/24/17 122 lb 12.8 oz (55.7 kg)  10/16/17 124 lb (56.2 kg)     Other studies Reviewed: Additional studies/ records that were reviewed today include:   Echocardiogram 07/28/17:  Study Conclusions  - Left ventricle: The cavity size was normal. Wall thickness was   increased in a pattern of moderate LVH. Systolic function was    normal. The estimated ejection fraction was in the range of 60%   to 65%. Wall motion was normal; there were no regional wall   motion abnormalities. - Aortic valve: A bioprosthesis was present. Valve area (VTI): 1.36   cm^2. Valve area (Vmax): 1.25 cm^2. Valve area (Vmean): 1.54   cm^2. - Left atrium: The atrium was moderately dilated. - Right atrium: The atrium was moderately to severely dilated. - Pulmonary arteries: Systolic pressure was mildly increased. PA   peak pressure: 34 mm Hg (S).   Right and left heart cath 06/06/17: Mildly elevated right  and left heart filling pressures.  2. Moderate pulmonary venous hypertension.  3. Marginal cardiac output.  4. There was not hemodynamic evidence for constrictive pericarditis present (simultaneous LV and RV pressures tracings were concordant).  5. Minimal pressure gradient across bioprosthetic aortic valve (s/p TAVR).  6. Patent RCA stent.  70% mid LAD stenosis, no change from 5/18 cath.  At that time, FFR was negative.   With dyspnea and elevated filling pressures suggestive of diastolic CHF, I will ask him to increase his Lasix to 40 mg po bid for now.    He was noted to be in rate-controlled atrial flutter today.  It appears that he has had atrial fibrillation in the past but most recent ECG showed NSR.  He is currently getting ibuprofen for treatment of pericardial disease.  I will need to discuss with Dr. Radford [name], will need to eventually anticoagulate him and cardiovert if he does not convert on his own.  I will not start anticoagulation today.   ASSESSMENT AND PLAN:  1.  New onset atrial fibrillation/flutter s/p DCCV on 10/27/17: -Last seen 10/16/17 in the office by Dr. Radford [name] and was in atrial flutter with controlled VR. He had an echocardiogram in 07/2017 and appeared to be in AF as well. He reported not missing any doses of Eliquis and was recommended that he proceed with DCCV>>completed 10/27/17 -NSR with PAC's today  -Denies  chest pain or palpitations>>reports symptoms have improved after DCCV  -Continue Eliquis, no s/s of bleeding  -No rate controlling agent>>soft BP  -CHA2DS2VASc =5 ( age, HF, HTN, vascular disease)  2. Severe AS s/p TVAR: -Asymptomatic  -Last echo 07/2017 with normal LV function and stable TAVR with AVA 1.36cm2 and mean AVG 27mmHg>>followed by Dr. Burt [name]  -Continue ASA  3. HTN: -Soft but stable, 100/50 -On no antihypertensives  4. ASCAD: -Hemodynamically significant RCA stenosis by FFR s/p PCI/DES>>R/L cath 06/06/17 with mildly elevated right and left filling pressures, moderate pul hypertension, no constrictive pericarditis, minimal gradient across bioprosthetic valve, and patent RCA stent   -Denies anginal symptoms -Continue statin  -No ASA secondary to DOAC  5. Pleurocardial effusion>>referred to pulmonary medicine: -Resolved however complex hx>>02/2017 chest CT with groundglass opacities>>> and lung nodules>>weight is stable however continued workup with pulmonary solved on echo 07/2017 -He is now being worked up and treated for cough variant asthma however etiology remains uncler -Followed with Dr. Chase [name]  6. HLD: -LDL, 65 on 11/05/16 -LDL goal <70, continue statin   7. Chronic diastolic CHF: -Euvolemic on exam today -Continue Lasix 40mg  twice daily    Current medicines are reviewed at length with the patient today.  The patient does not have concerns regarding medicines.  The following changes have been made:  no change  Labs/ tests ordered today include: None  No orders of the defined types were placed in this encounter.   Disposition:  FU with Dr. Radford [name] in 6 months or sooner if you begin having recurrent symptoms   Signed, Kathyrn Drown, NP  11/04/2017 8:49 AM    [address], [place], [place]  [address] Phone: [phone]; Fax: [phone]

## 2017-11-04 ENCOUNTER — Encounter: Payer: Self-pay | Admitting: Cardiology

## 2017-11-04 ENCOUNTER — Ambulatory Visit (INDEPENDENT_AMBULATORY_CARE_PROVIDER_SITE_OTHER): Payer: Medicare Other | Admitting: Cardiology

## 2017-11-04 VITALS — BP 100/50 | HR 71 | Ht 69.5 in | Wt 124.8 lb

## 2017-11-04 DIAGNOSIS — I10 Essential (primary) hypertension: Secondary | ICD-10-CM

## 2017-11-04 DIAGNOSIS — I480 Paroxysmal atrial fibrillation: Secondary | ICD-10-CM | POA: Diagnosis not present

## 2017-11-04 DIAGNOSIS — I2510 Atherosclerotic heart disease of native coronary artery without angina pectoris: Secondary | ICD-10-CM | POA: Diagnosis not present

## 2017-11-04 DIAGNOSIS — Z953 Presence of xenogenic heart valve: Secondary | ICD-10-CM | POA: Diagnosis not present

## 2017-11-04 DIAGNOSIS — I2584 Coronary atherosclerosis due to calcified coronary lesion: Secondary | ICD-10-CM

## 2017-11-04 NOTE — Patient Instructions (Signed)
Medication Instructions:  Your physician recommends that you continue on your current medications as directed. Please refer to the Current Medication list given to you today.   If you need a refill on your cardiac medications before your next appointment, please call your pharmacy.   Lab work: None  If you have labs (blood work) drawn today and your tests are completely normal, you will receive your results only by: Marland Kitchen MyChart Message (if you have MyChart) OR . A paper copy in the mail If you have any lab test that is abnormal or we need to change your treatment, we will call you to review the results.  Testing/Procedures: None  Follow-Up: At [facility], you and your health needs are our priority.  As part of our continuing mission to provide you with exceptional heart care, we have created designated Provider Care Teams.  These Care Teams include your primary Cardiologist (physician) and Advanced Practice Providers (APPs -  Physician Assistants and Nurse Practitioners) who all work together to provide you with the care you need, when you need it. You will need a follow up appointment in 6 months.  Please call our office 2 months in advance to schedule this appointment.  You may see Fransico Him, MD or one of the following Advanced Practice Providers on your designated Care Team:   [place], PA-C Melina Copa, PA-C . Ermalinda Barrios, PA-C  Any Other Special Instructions Will Be Listed Below (If Applicable). None

## 2017-11-11 ENCOUNTER — Ambulatory Visit: Payer: Medicare Other | Admitting: Internal Medicine

## 2017-11-20 DIAGNOSIS — Z961 Presence of intraocular lens: Secondary | ICD-10-CM | POA: Diagnosis not present

## 2017-11-20 DIAGNOSIS — H401123 Primary open-angle glaucoma, left eye, severe stage: Secondary | ICD-10-CM | POA: Diagnosis not present

## 2017-11-20 DIAGNOSIS — H401112 Primary open-angle glaucoma, right eye, moderate stage: Secondary | ICD-10-CM | POA: Diagnosis not present

## 2017-11-20 DIAGNOSIS — H1851 Endothelial corneal dystrophy: Secondary | ICD-10-CM | POA: Diagnosis not present

## 2018-01-03 DIAGNOSIS — R05 Cough: Secondary | ICD-10-CM | POA: Diagnosis not present

## 2018-01-03 DIAGNOSIS — R0989 Other specified symptoms and signs involving the circulatory and respiratory systems: Secondary | ICD-10-CM | POA: Diagnosis not present

## 2018-01-03 DIAGNOSIS — J0100 Acute maxillary sinusitis, unspecified: Secondary | ICD-10-CM | POA: Diagnosis not present

## 2018-01-03 DIAGNOSIS — J40 Bronchitis, not specified as acute or chronic: Secondary | ICD-10-CM | POA: Diagnosis not present

## 2018-01-18 DIAGNOSIS — J45991 Cough variant asthma: Secondary | ICD-10-CM | POA: Diagnosis not present

## 2018-01-28 DIAGNOSIS — E7800 Pure hypercholesterolemia, unspecified: Secondary | ICD-10-CM | POA: Diagnosis not present

## 2018-01-28 DIAGNOSIS — J309 Allergic rhinitis, unspecified: Secondary | ICD-10-CM | POA: Diagnosis not present

## 2018-01-28 DIAGNOSIS — M1612 Unilateral primary osteoarthritis, left hip: Secondary | ICD-10-CM | POA: Diagnosis not present

## 2018-01-28 DIAGNOSIS — R6882 Decreased libido: Secondary | ICD-10-CM | POA: Diagnosis not present

## 2018-01-28 DIAGNOSIS — E440 Moderate protein-calorie malnutrition: Secondary | ICD-10-CM | POA: Diagnosis not present

## 2018-01-28 DIAGNOSIS — I480 Paroxysmal atrial fibrillation: Secondary | ICD-10-CM | POA: Diagnosis not present

## 2018-01-28 DIAGNOSIS — Q231 Congenital insufficiency of aortic valve: Secondary | ICD-10-CM | POA: Diagnosis not present

## 2018-01-28 DIAGNOSIS — Z0000 Encounter for general adult medical examination without abnormal findings: Secondary | ICD-10-CM | POA: Diagnosis not present

## 2018-01-28 DIAGNOSIS — I350 Nonrheumatic aortic (valve) stenosis: Secondary | ICD-10-CM | POA: Diagnosis not present

## 2018-01-28 DIAGNOSIS — R972 Elevated prostate specific antigen [PSA]: Secondary | ICD-10-CM | POA: Diagnosis not present

## 2018-01-28 DIAGNOSIS — K863 Pseudocyst of pancreas: Secondary | ICD-10-CM | POA: Diagnosis not present

## 2018-01-28 DIAGNOSIS — I10 Essential (primary) hypertension: Secondary | ICD-10-CM | POA: Diagnosis not present

## 2018-02-04 DIAGNOSIS — H0100A Unspecified blepharitis right eye, upper and lower eyelids: Secondary | ICD-10-CM | POA: Diagnosis not present

## 2018-02-04 DIAGNOSIS — H401123 Primary open-angle glaucoma, left eye, severe stage: Secondary | ICD-10-CM | POA: Diagnosis not present

## 2018-02-04 DIAGNOSIS — H0100B Unspecified blepharitis left eye, upper and lower eyelids: Secondary | ICD-10-CM | POA: Diagnosis not present

## 2018-02-04 DIAGNOSIS — H1851 Endothelial corneal dystrophy: Secondary | ICD-10-CM | POA: Diagnosis not present

## 2018-02-05 DIAGNOSIS — R6882 Decreased libido: Secondary | ICD-10-CM | POA: Diagnosis not present

## 2018-02-05 DIAGNOSIS — I10 Essential (primary) hypertension: Secondary | ICD-10-CM | POA: Diagnosis not present

## 2018-02-09 DIAGNOSIS — N401 Enlarged prostate with lower urinary tract symptoms: Secondary | ICD-10-CM | POA: Diagnosis not present

## 2018-02-09 DIAGNOSIS — R351 Nocturia: Secondary | ICD-10-CM | POA: Diagnosis not present

## 2018-02-09 DIAGNOSIS — R3912 Poor urinary stream: Secondary | ICD-10-CM | POA: Diagnosis not present

## 2018-02-19 DIAGNOSIS — J3089 Other allergic rhinitis: Secondary | ICD-10-CM | POA: Diagnosis not present

## 2018-02-19 DIAGNOSIS — J3081 Allergic rhinitis due to animal (cat) (dog) hair and dander: Secondary | ICD-10-CM | POA: Diagnosis not present

## 2018-02-19 DIAGNOSIS — J301 Allergic rhinitis due to pollen: Secondary | ICD-10-CM | POA: Diagnosis not present

## 2018-02-25 DIAGNOSIS — J3089 Other allergic rhinitis: Secondary | ICD-10-CM | POA: Diagnosis not present

## 2018-02-25 DIAGNOSIS — J301 Allergic rhinitis due to pollen: Secondary | ICD-10-CM | POA: Diagnosis not present

## 2018-02-25 DIAGNOSIS — R748 Abnormal levels of other serum enzymes: Secondary | ICD-10-CM | POA: Diagnosis not present

## 2018-02-25 DIAGNOSIS — J3081 Allergic rhinitis due to animal (cat) (dog) hair and dander: Secondary | ICD-10-CM | POA: Diagnosis not present

## 2018-02-27 DIAGNOSIS — J3081 Allergic rhinitis due to animal (cat) (dog) hair and dander: Secondary | ICD-10-CM | POA: Diagnosis not present

## 2018-02-27 DIAGNOSIS — J301 Allergic rhinitis due to pollen: Secondary | ICD-10-CM | POA: Diagnosis not present

## 2018-02-27 DIAGNOSIS — J3089 Other allergic rhinitis: Secondary | ICD-10-CM | POA: Diagnosis not present

## 2018-03-02 DIAGNOSIS — J3089 Other allergic rhinitis: Secondary | ICD-10-CM | POA: Diagnosis not present

## 2018-03-02 DIAGNOSIS — J301 Allergic rhinitis due to pollen: Secondary | ICD-10-CM | POA: Diagnosis not present

## 2018-03-02 DIAGNOSIS — J3081 Allergic rhinitis due to animal (cat) (dog) hair and dander: Secondary | ICD-10-CM | POA: Diagnosis not present

## 2018-03-05 DIAGNOSIS — J3089 Other allergic rhinitis: Secondary | ICD-10-CM | POA: Diagnosis not present

## 2018-03-05 DIAGNOSIS — J301 Allergic rhinitis due to pollen: Secondary | ICD-10-CM | POA: Diagnosis not present

## 2018-03-05 DIAGNOSIS — J3081 Allergic rhinitis due to animal (cat) (dog) hair and dander: Secondary | ICD-10-CM | POA: Diagnosis not present

## 2018-03-09 DIAGNOSIS — J3089 Other allergic rhinitis: Secondary | ICD-10-CM | POA: Diagnosis not present

## 2018-03-09 DIAGNOSIS — J301 Allergic rhinitis due to pollen: Secondary | ICD-10-CM | POA: Diagnosis not present

## 2018-03-09 DIAGNOSIS — J3081 Allergic rhinitis due to animal (cat) (dog) hair and dander: Secondary | ICD-10-CM | POA: Diagnosis not present

## 2018-03-11 DIAGNOSIS — J3089 Other allergic rhinitis: Secondary | ICD-10-CM | POA: Diagnosis not present

## 2018-03-11 DIAGNOSIS — J301 Allergic rhinitis due to pollen: Secondary | ICD-10-CM | POA: Diagnosis not present

## 2018-03-11 DIAGNOSIS — J3081 Allergic rhinitis due to animal (cat) (dog) hair and dander: Secondary | ICD-10-CM | POA: Diagnosis not present

## 2018-03-16 ENCOUNTER — Ambulatory Visit (INDEPENDENT_AMBULATORY_CARE_PROVIDER_SITE_OTHER): Payer: Medicare Other | Admitting: Cardiology

## 2018-03-16 ENCOUNTER — Telehealth: Payer: Self-pay

## 2018-03-16 ENCOUNTER — Encounter: Payer: Self-pay | Admitting: Cardiology

## 2018-03-16 VITALS — BP 122/62 | HR 76 | Ht 69.5 in | Wt 128.0 lb

## 2018-03-16 DIAGNOSIS — E7800 Pure hypercholesterolemia, unspecified: Secondary | ICD-10-CM

## 2018-03-16 DIAGNOSIS — I480 Paroxysmal atrial fibrillation: Secondary | ICD-10-CM | POA: Diagnosis not present

## 2018-03-16 DIAGNOSIS — I10 Essential (primary) hypertension: Secondary | ICD-10-CM | POA: Diagnosis not present

## 2018-03-16 DIAGNOSIS — I313 Pericardial effusion (noninflammatory): Secondary | ICD-10-CM

## 2018-03-16 DIAGNOSIS — I2510 Atherosclerotic heart disease of native coronary artery without angina pectoris: Secondary | ICD-10-CM | POA: Diagnosis not present

## 2018-03-16 DIAGNOSIS — I350 Nonrheumatic aortic (valve) stenosis: Secondary | ICD-10-CM

## 2018-03-16 DIAGNOSIS — I3139 Other pericardial effusion (noninflammatory): Secondary | ICD-10-CM

## 2018-03-16 DIAGNOSIS — I5032 Chronic diastolic (congestive) heart failure: Secondary | ICD-10-CM

## 2018-03-16 MED ORDER — PRAVASTATIN SODIUM 80 MG PO TABS: 80.0000 mg | ORAL_TABLET | Freq: Every evening | ORAL | 3 refills | Status: DC

## 2018-03-16 NOTE — Telephone Encounter (Signed)
-----   Message from Sueanne Margarita, MD sent at 03/16/2018  1:20 PM EST ----- LDL not at goal on lipids 01/2018.  LDL was 99.  Please increase Pravachol to 80mg  daily and repeat FLP and ALT in 6 weeks

## 2018-03-16 NOTE — Patient Instructions (Signed)
Medication Instructions:  Your physician recommends that you continue on your current medications as directed. Please refer to the Current Medication list given to you today.  If you need a refill on your cardiac medications before your next appointment, please call your pharmacy.   Lab work: None If you have labs (blood work) drawn today and your tests are completely normal, you will receive your results only by: . MyChart Message (if you have MyChart) OR . A paper copy in the mail If you have any lab test that is abnormal or we need to change your treatment, we will call you to review the results.  Testing/Procedures: None  Follow-Up: At [facility], you and your health needs are our priority.  As part of our continuing mission to provide you with exceptional heart care, we have created designated Provider Care Teams.  These Care Teams include your primary Cardiologist (physician) and Advanced Practice Providers (APPs -  Physician Assistants and Nurse Practitioners) who all work together to provide you with the care you need, when you need it. You will need a follow up appointment in 6 months.  Please call our office 2 months in advance to schedule this appointment.  You may see Traci Turner, MD or one of the following Advanced Practice Providers on your designated Care Team:   Brittainy Simmons, PA-C Dayna Dunn, PA-C . Michele Lenze, PA-C     

## 2018-03-16 NOTE — Telephone Encounter (Signed)
Spoke with the patient, he accepted taking a higher dose of Pravastatin. He is schedule for fasting labs on 05/05/18. He had no further questions.

## 2018-03-16 NOTE — Progress Notes (Signed)
Cardiology Office Note:    Date:  03/16/2018   ID:  [name] [name], DOB Sep 06, 1935, MRN [id number]  PCP:  Maury Dus, MD  Cardiologist:  Fransico Him, MD    Referring MD: Maury Dus, MD   Chief Complaint  Patient presents with  . Coronary Artery Disease  . Aortic Stenosis  . Hyperlipidemia    History of Present Illness:    [name] [name] is a 83 y.o. male with a hx of dyslipidemia and family hx of CAD, bicuspid aortic valve with severe AS s/p TAVR 06/2016. LHC at time of TAVR showed non-obstructive CAD in the LAD and ramus with hemodynamically significant RCA stenosis by FFR s/p PCI of the RCA with DES on 06/06/16. He has a chronic pericardial effusion with extensive workup and unknown etiology.  He also has PAF and is on Eliquis for CHADS2VASC score of 4.   Per chart review, he has had recent c/o unintentional weight loss of approximately 40lbs over the past year with known pulmonary nodules on chest CT in 02/2017. He had a sed rate and CRP drawn which were elevated and he was ultimately sent to pulmonary medicine with Dr. Chase Caller.   Due to symptoms of right heart failure with increased abdominal fullness, weight loss and lower extremity edema and underwent a R/LHC secondary to concern for constrictive pericarditis. R/L cath completed on 05/29/17 which showed elevated filling pressures suggestive of diastolic CHF, moderate pulmonary hypertension, no evidence of constrictive pericarditis, minimal pressure gradient over prosthetic valve and patent RCA stent. He was noted to be in atrial flutter with rate control and plans were made for DCCV after adequate AC. Pt underwent successful DCCV on 10/27/17   He is here today for followup and is doing well.  He denies any chest pain or pressure, SOB, DOE, PND, orthopnea, LE edema, dizziness, palpitations or syncope. He is compliant with his meds and is tolerating meds with no SE.    Past Medical History:  Diagnosis Date  . Actinic  keratoses    Dr Glee Arvin  . Allergic rhinitis   . Aortic stenosis    Bicuspid AV with severe AS s/p TAVR 06/2016  . Arthritis of left hip 11/03/2014  . Benign essential HTN   . Bilateral cataracts   . 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 BPH (benign prostatic hypertrophy)   . Chronic diastolic CHF (congestive heart failure) ([place]) 10/16/2017  . Coronary artery disease involving native coronary artery of native heart without angina pectoris    PCI 06/06/16 LAD proximal 60 (FFR 0.85-negative); RI 50; LCx calcified without significant stenosis; RCA mid 80 (FFR 0.68-hemodynamically significant); PCI: 2.25 x 16 mm Synergy DES to the mid RCA  . Dilated aortic root ([place])    76mm by echo 08/2015  . Glaucoma   . H/O seasonal allergies   . Hx of adenomatous colonic polyps 1991 on  . Hyperlipidemia   . Mitral valve prolapse    mild MR by echo 08/2015  . Multifocal pneumonia 12/01/2016  . OA (osteoarthritis) of hip 10/16/2016  . PAF (paroxysmal atrial fibrillation) ([place])    S/P DCCV 10/2017  . Paresthesia    Chronic left lateral thigh  . Pericardial effusion 11/05/2016  . Postural dizziness with presyncope 04/23/2017  . Pulmonary hypertension, moderate to severe (HCC)    moderate by right heart cath 02/2017  . Pulmonary nodules     Past Surgical History:  Procedure Laterality Date  . CARDIOVERSION N/A 10/27/2017   Procedure: CARDIOVERSION;  Surgeon: Josue Hector, MD;  Location: [facility] ENDOSCOPY;  Service: Cardiovascular;  Laterality: N/A;  . COLONOSCOPY  multiple  . CORONARY STENT INTERVENTION N/A 06/06/2016   Procedure: Coronary Stent Intervention;  Surgeon: [name], MD;  Location: [place] CV LAB;  Service: Cardiovascular;  Laterality: N/A;  . CYSTOSCOPY  08/12/2016   per patient; showed a cyst on bladder , liver , and kidney , sees urologist Matilde Bash III at [facility]  . EYE SURGERY     Bilateral cataract removal  . INTRAVASCULAR PRESSURE WIRE/FFR STUDY N/A 05/29/2016    Procedure: Intravascular Pressure Wire/FFR Study;  Surgeon: [name], MD;  Location: [place] CV LAB;  Service: Cardiovascular;  Laterality: N/A;  . IR THORACENTESIS ASP PLEURAL SPACE W/IMG GUIDE  05/28/2017  . RIGHT/LEFT HEART CATH AND CORONARY ANGIOGRAPHY N/A 05/29/2016   Procedure: Right/Left Heart Cath and Coronary Angiography;  Surgeon: [name], MD;  Location: [place] CV LAB;  Service: Cardiovascular;  Laterality: N/A;  . RIGHT/LEFT HEART CATH AND CORONARY ANGIOGRAPHY N/A 06/06/2017   Procedure: RIGHT/LEFT HEART CATH AND CORONARY ANGIOGRAPHY;  Surgeon: Larey Dresser, MD;  Location: [place] CV LAB;  Service: Cardiovascular;  Laterality: N/A;  . TEE WITHOUT CARDIOVERSION N/A 07/02/2016   Procedure: TRANSESOPHAGEAL ECHOCARDIOGRAM (TEE);  Surgeon: [name], MD;  Location: [place];  Service: Open Heart Surgery;  Laterality: N/A;  . TONSILLECTOMY  1949  . TOTAL HIP ARTHROPLASTY Left 10/16/2016   Procedure: LEFT TOTAL HIP ARTHROPLASTY ANTERIOR APPROACH;  Surgeon: Gaynelle Arabian, MD;  Location: [facility];  Service: Orthopedics;  Laterality: Left;  . TRANSCATHETER AORTIC VALVE REPLACEMENT, TRANSFEMORAL N/A 07/02/2016   Procedure: TRANSCATHETER AORTIC VALVE REPLACEMENT, TRANSFEMORAL;  Surgeon: [name], MD;  Location: [place];  Service: Open Heart Surgery;  Laterality: N/A;    Current Medications: Current Meds  Medication Sig  . apixaban (ELIQUIS) 5 MG TABS tablet Take 1 tablet (5 mg total) by mouth 2 (two) times daily.  . feeding supplement, ENSURE ENLIVE, (ENSURE ENLIVE) LIQD Take 237 mLs by mouth daily.  . Feeding Supplies MISC by Does not apply route.  . fluticasone (FLONASE) 50 MCG/ACT nasal spray Place 1 spray into both nostrils at bedtime as needed for allergies.   . furosemide (LASIX) 20 MG tablet Take 2 tablets (40 mg total) by mouth 2 (two) times daily.  Marland Kitchen latanoprost (XALATAN) 0.005 % ophthalmic solution Place 1 drop into both eyes at bedtime.  .  pravastatin (PRAVACHOL) 40 MG tablet Take 40 mg by mouth at bedtime.   . timolol (BETIMOL) 0.5 % ophthalmic solution Place 1 drop into both eyes 2 (two) times daily.     Allergies:   Patient has no known allergies.   Social History   Socioeconomic History  . Marital status: Widowed    Spouse name: Not on file  . Number of children: Not on file  . Years of education: Not on file  . Highest education level: Not on file  Occupational History  . Not on file  Social Needs  . Financial resource strain: Not on file  . Food insecurity:    Worry: Not on file    Inability: Not on file  . Transportation needs:    Medical: Not on file    Non-medical: Not on file  Tobacco Use  . Smoking status: Former Smoker    Packs/day: 1.00    Years: 1.00    Pack years: 1.00    Types: Cigarettes    Last attempt to quit: 07/14/1960  Years since quitting: 57.7  . Smokeless tobacco: Never Used  Substance and Sexual Activity  . Alcohol use: No    Frequency: Never  . Drug use: No  . Sexual activity: Not on file  Lifestyle  . Physical activity:    Days per week: Not on file    Minutes per session: Not on file  . Stress: Not on file  Relationships  . Social connections:    Talks on phone: Not on file    Gets together: Not on file    Attends religious service: Not on file    Active member of club or organization: Not on file    Attends meetings of clubs or organizations: Not on file    Relationship status: Not on file  Other Topics Concern  . Not on file  Social History Narrative  . Not on file     Family History: The patient's family history includes Emphysema in his mother; Heart attack in his brother; Heart disease in his brother; Pancreatic cancer in his father. There is no history of Colon cancer or Stomach cancer.  ROS:   Please see the history of present illness.    ROS  All other systems reviewed and negative.   EKGs/Labs/Other Studies Reviewed:    The following studies were  reviewed today: none  EKG:  EKG is not ordered today.    Recent Labs: 04/23/2017: Magnesium 2.0 05/16/2017: TSH 2.740 07/01/2017: NT-Pro BNP 2,182 10/21/2017: BUN 33; Creatinine, Ser 1.17; Hemoglobin 14.1; Platelets 195; Potassium 4.9; Sodium 137   Recent Lipid Panel    Component Value Date/Time   CHOL 120 11/05/2016 0936   TRIG 81 11/05/2016 0936   HDL 39 (L) 11/05/2016 0936   CHOLHDL 3.1 11/05/2016 0936   LDLCALC 65 11/05/2016 0936    Physical Exam:    VS:  BP 122/62   Pulse 76   Ht 5' 9.5" (1.765 m)   Wt 128 lb (58.1 kg)   SpO2 93%   BMI 18.63 kg/m     Wt Readings from Last 3 Encounters:  03/16/18 128 lb (58.1 kg)  11/04/17 124 lb 12.8 oz (56.6 kg)  10/24/17 122 lb 12.8 oz (55.7 kg)     GEN:  Well nourished, well developed in no acute distress HEENT: Normal NECK: No JVD; No carotid bruits LYMPHATICS: No lymphadenopathy CARDIAC: RRR, no murmurs, rubs, gallops RESPIRATORY:  Clear to auscultation without rales, wheezing or rhonchi  ABDOMEN: Soft, non-tender, non-distended MUSCULOSKELETAL:  No edema; No deformity  SKIN: Warm and dry NEUROLOGIC:  Alert and oriented x 3 PSYCHIATRIC:  Normal affect   ASSESSMENT:    1. Nonrheumatic aortic valve stenosis   2. Benign essential HTN   3. Coronary artery disease involving native coronary artery of native heart without angina pectoris   4. Pericardial effusion   5. PAF (paroxysmal atrial fibrillation) ([place])   6. Chronic diastolic CHF (congestive heart failure) ([place])   7. Pure hypercholesterolemia    PLAN:    In order of problems listed above:  1.  Bicuspid AV with severe AS -  S/p TAVR 06/2016.  He is doing well with no problems.  Echo 07/2017 showed stable TAVR with mean AVG 40mmHg. He is not on ASA due to DOAC.  2.  HTN - BP well controlled on exam today.  He has not required antihypertensive therapy.   3.  ASCAD - non-obstructive CAD in the LAD and ramus with hemodynamically significant RCA stenosis by FFR s/p PCI  of  the RCA with DES on 06/06/16.  He denies any anginal symptoms.  He will continue on statin.  He is not on aspirin due to Eliquis.  4.  Pericardial effusion - resolved on last echo 07/2017.  5.  PAF - status post DCCV 11/02/2017.  He is maintaining normal sinus rhythm on exam.  He will continue on apixaban 5 mg twice daily.  His last creatinine was stable at 0.94 potassium 4.5 1/2 through 01/03/2019.  Hemoglobin stable at 13.5 on 02/05/2018.  6.  Chronic diastolic CHF -is not appear volume overloaded on exam today.  His weight is stable.  He has not required any diuretic therapy recently.  7.  Hyperlipidemia - LDL goal is less than 70.  LDL was 99 on 02/05/2018.  I have recommended that he increase pravastatin to 80mg  daily and repeat FLP and ALT. 6 weeks.    Medication Adjustments/Labs and Tests Ordered: Current medicines are reviewed at length with the patient today.  Concerns regarding medicines are outlined above.  No orders of the defined types were placed in this encounter.  No orders of the defined types were placed in this encounter.   Signed, Fransico Him, MD  03/16/2018 1:06 PM    [place]

## 2018-03-18 DIAGNOSIS — J3089 Other allergic rhinitis: Secondary | ICD-10-CM | POA: Diagnosis not present

## 2018-03-18 DIAGNOSIS — J3081 Allergic rhinitis due to animal (cat) (dog) hair and dander: Secondary | ICD-10-CM | POA: Diagnosis not present

## 2018-03-18 DIAGNOSIS — J301 Allergic rhinitis due to pollen: Secondary | ICD-10-CM | POA: Diagnosis not present

## 2018-03-20 DIAGNOSIS — J301 Allergic rhinitis due to pollen: Secondary | ICD-10-CM | POA: Diagnosis not present

## 2018-03-20 DIAGNOSIS — J3089 Other allergic rhinitis: Secondary | ICD-10-CM | POA: Diagnosis not present

## 2018-03-20 DIAGNOSIS — J3081 Allergic rhinitis due to animal (cat) (dog) hair and dander: Secondary | ICD-10-CM | POA: Diagnosis not present

## 2018-03-25 DIAGNOSIS — J301 Allergic rhinitis due to pollen: Secondary | ICD-10-CM | POA: Diagnosis not present

## 2018-03-25 DIAGNOSIS — J3089 Other allergic rhinitis: Secondary | ICD-10-CM | POA: Diagnosis not present

## 2018-03-25 DIAGNOSIS — J3081 Allergic rhinitis due to animal (cat) (dog) hair and dander: Secondary | ICD-10-CM | POA: Diagnosis not present

## 2018-03-27 DIAGNOSIS — J3089 Other allergic rhinitis: Secondary | ICD-10-CM | POA: Diagnosis not present

## 2018-03-27 DIAGNOSIS — J3081 Allergic rhinitis due to animal (cat) (dog) hair and dander: Secondary | ICD-10-CM | POA: Diagnosis not present

## 2018-03-27 DIAGNOSIS — J301 Allergic rhinitis due to pollen: Secondary | ICD-10-CM | POA: Diagnosis not present

## 2018-03-30 DIAGNOSIS — J3089 Other allergic rhinitis: Secondary | ICD-10-CM | POA: Diagnosis not present

## 2018-03-30 DIAGNOSIS — J3081 Allergic rhinitis due to animal (cat) (dog) hair and dander: Secondary | ICD-10-CM | POA: Diagnosis not present

## 2018-03-30 DIAGNOSIS — J301 Allergic rhinitis due to pollen: Secondary | ICD-10-CM | POA: Diagnosis not present

## 2018-03-31 DIAGNOSIS — R748 Abnormal levels of other serum enzymes: Secondary | ICD-10-CM | POA: Diagnosis not present

## 2018-04-02 DIAGNOSIS — J3081 Allergic rhinitis due to animal (cat) (dog) hair and dander: Secondary | ICD-10-CM | POA: Diagnosis not present

## 2018-04-02 DIAGNOSIS — J3089 Other allergic rhinitis: Secondary | ICD-10-CM | POA: Diagnosis not present

## 2018-04-02 DIAGNOSIS — J301 Allergic rhinitis due to pollen: Secondary | ICD-10-CM | POA: Diagnosis not present

## 2018-04-06 DIAGNOSIS — J301 Allergic rhinitis due to pollen: Secondary | ICD-10-CM | POA: Diagnosis not present

## 2018-04-06 DIAGNOSIS — J3081 Allergic rhinitis due to animal (cat) (dog) hair and dander: Secondary | ICD-10-CM | POA: Diagnosis not present

## 2018-04-06 DIAGNOSIS — J3089 Other allergic rhinitis: Secondary | ICD-10-CM | POA: Diagnosis not present

## 2018-04-09 DIAGNOSIS — J3089 Other allergic rhinitis: Secondary | ICD-10-CM | POA: Diagnosis not present

## 2018-04-09 DIAGNOSIS — J301 Allergic rhinitis due to pollen: Secondary | ICD-10-CM | POA: Diagnosis not present

## 2018-04-09 DIAGNOSIS — J3081 Allergic rhinitis due to animal (cat) (dog) hair and dander: Secondary | ICD-10-CM | POA: Diagnosis not present

## 2018-04-13 DIAGNOSIS — J3089 Other allergic rhinitis: Secondary | ICD-10-CM | POA: Diagnosis not present

## 2018-04-13 DIAGNOSIS — J301 Allergic rhinitis due to pollen: Secondary | ICD-10-CM | POA: Diagnosis not present

## 2018-04-13 DIAGNOSIS — J3081 Allergic rhinitis due to animal (cat) (dog) hair and dander: Secondary | ICD-10-CM | POA: Diagnosis not present

## 2018-04-16 DIAGNOSIS — J3089 Other allergic rhinitis: Secondary | ICD-10-CM | POA: Diagnosis not present

## 2018-04-16 DIAGNOSIS — J3081 Allergic rhinitis due to animal (cat) (dog) hair and dander: Secondary | ICD-10-CM | POA: Diagnosis not present

## 2018-04-16 DIAGNOSIS — J301 Allergic rhinitis due to pollen: Secondary | ICD-10-CM | POA: Diagnosis not present

## 2018-04-29 ENCOUNTER — Other Ambulatory Visit: Payer: Self-pay | Admitting: Cardiology

## 2018-04-29 MED ORDER — APIXABAN 2.5 MG PO TABS: 2.5000 mg | ORAL_TABLET | Freq: Two times a day (BID) | ORAL | 1 refills | Status: DC

## 2018-04-29 NOTE — Telephone Encounter (Signed)
Spoke with Dr. Radford Pax about Eliquis 5mg  BID. She requested I call patient and if weight is still <60kg to change dose to 2.5mg  BID. Patient weighed himself while on the phone. Weight was 125ln (56kg). Patient informed due to his age and weight that his dose would be lowered to 2.5mg  BID. Patient in agreement.

## 2018-04-29 NOTE — Telephone Encounter (Signed)
Eliquis 5mg  refill request received; pt is 83 yrs old, wt-58.1kg, Crea-1.17 on 10/21/2017, last seen by Dr. Radford Pax on 03/16/2018

## 2018-05-05 ENCOUNTER — Other Ambulatory Visit: Payer: Medicare Other

## 2018-05-05 ENCOUNTER — Other Ambulatory Visit: Payer: Self-pay

## 2018-05-05 DIAGNOSIS — E7800 Pure hypercholesterolemia, unspecified: Secondary | ICD-10-CM

## 2018-05-05 LAB — LIPID PANEL
Chol/HDL Ratio: 2.6 ratio (ref 0.0–5.0)
Cholesterol, Total: 147 mg/dL (ref 100–199)
HDL: 56 mg/dL (ref >39)
LDL Calculated: 74 mg/dL (ref 0–99)
Triglycerides: 83 mg/dL (ref 0–149)
VLDL Cholesterol Cal: 17 mg/dL (ref 5–40)

## 2018-05-05 LAB — HEPATIC FUNCTION PANEL
ALT: 36 IU/L (ref 0–44)
AST: 39 IU/L (ref 0–40)
Albumin: 4.3 g/dL (ref 3.7–4.7)
Alkaline Phosphatase: 118 IU/L — ABNORMAL HIGH (ref 39–117)
Bilirubin Total: 0.5 mg/dL (ref 0.0–1.2)
Bilirubin, Direct: 0.14 mg/dL (ref 0.00–0.40)
Total Protein: 7.3 g/dL (ref 6.0–8.5)

## 2018-05-15 DIAGNOSIS — J301 Allergic rhinitis due to pollen: Secondary | ICD-10-CM | POA: Diagnosis not present

## 2018-05-15 DIAGNOSIS — J3089 Other allergic rhinitis: Secondary | ICD-10-CM | POA: Diagnosis not present

## 2018-05-15 DIAGNOSIS — J3081 Allergic rhinitis due to animal (cat) (dog) hair and dander: Secondary | ICD-10-CM | POA: Diagnosis not present

## 2018-05-28 DIAGNOSIS — J3089 Other allergic rhinitis: Secondary | ICD-10-CM | POA: Diagnosis not present

## 2018-06-15 DIAGNOSIS — J3081 Allergic rhinitis due to animal (cat) (dog) hair and dander: Secondary | ICD-10-CM | POA: Diagnosis not present

## 2018-06-15 DIAGNOSIS — J301 Allergic rhinitis due to pollen: Secondary | ICD-10-CM | POA: Diagnosis not present

## 2018-06-15 DIAGNOSIS — J3089 Other allergic rhinitis: Secondary | ICD-10-CM | POA: Diagnosis not present

## 2018-06-29 DIAGNOSIS — J309 Allergic rhinitis, unspecified: Secondary | ICD-10-CM | POA: Diagnosis not present

## 2018-06-29 DIAGNOSIS — J019 Acute sinusitis, unspecified: Secondary | ICD-10-CM | POA: Diagnosis not present

## 2018-07-03 DIAGNOSIS — H524 Presbyopia: Secondary | ICD-10-CM | POA: Diagnosis not present

## 2018-07-03 DIAGNOSIS — H5340 Unspecified visual field defects: Secondary | ICD-10-CM | POA: Diagnosis not present

## 2018-07-03 DIAGNOSIS — H401123 Primary open-angle glaucoma, left eye, severe stage: Secondary | ICD-10-CM | POA: Diagnosis not present

## 2018-07-03 DIAGNOSIS — H35373 Puckering of macula, bilateral: Secondary | ICD-10-CM | POA: Diagnosis not present

## 2018-07-13 ENCOUNTER — Ambulatory Visit
Admission: RE | Admit: 2018-07-13 | Discharge: 2018-07-13 | Disposition: A | Discharge status: Home / Self Care | Payer: Medicare Other | Source: Ambulatory Visit | Attending: Family Medicine | Admitting: Family Medicine

## 2018-07-13 ENCOUNTER — Other Ambulatory Visit: Payer: Self-pay | Admitting: Family Medicine

## 2018-07-13 DIAGNOSIS — R059 Cough, unspecified: Secondary | ICD-10-CM

## 2018-07-13 DIAGNOSIS — R05 Cough: Secondary | ICD-10-CM | POA: Diagnosis not present

## 2018-07-16 DIAGNOSIS — J3089 Other allergic rhinitis: Secondary | ICD-10-CM | POA: Diagnosis not present

## 2018-07-16 DIAGNOSIS — J3081 Allergic rhinitis due to animal (cat) (dog) hair and dander: Secondary | ICD-10-CM | POA: Diagnosis not present

## 2018-07-16 DIAGNOSIS — J301 Allergic rhinitis due to pollen: Secondary | ICD-10-CM | POA: Diagnosis not present

## 2018-07-27 DIAGNOSIS — R05 Cough: Secondary | ICD-10-CM | POA: Diagnosis not present

## 2018-07-27 DIAGNOSIS — J309 Allergic rhinitis, unspecified: Secondary | ICD-10-CM | POA: Diagnosis not present

## 2018-08-10 DIAGNOSIS — R740 Nonspecific elevation of levels of transaminase and lactic acid dehydrogenase [LDH]: Secondary | ICD-10-CM | POA: Diagnosis not present

## 2018-08-17 DIAGNOSIS — J3089 Other allergic rhinitis: Secondary | ICD-10-CM | POA: Diagnosis not present

## 2018-08-17 DIAGNOSIS — J301 Allergic rhinitis due to pollen: Secondary | ICD-10-CM | POA: Diagnosis not present

## 2018-08-17 DIAGNOSIS — J3081 Allergic rhinitis due to animal (cat) (dog) hair and dander: Secondary | ICD-10-CM | POA: Diagnosis not present

## 2018-09-08 ENCOUNTER — Other Ambulatory Visit: Payer: Self-pay | Admitting: Cardiology

## 2018-09-09 DIAGNOSIS — J3089 Other allergic rhinitis: Secondary | ICD-10-CM | POA: Diagnosis not present

## 2018-09-09 DIAGNOSIS — J019 Acute sinusitis, unspecified: Secondary | ICD-10-CM | POA: Diagnosis not present

## 2018-09-09 DIAGNOSIS — J301 Allergic rhinitis due to pollen: Secondary | ICD-10-CM | POA: Diagnosis not present

## 2018-09-09 DIAGNOSIS — J3081 Allergic rhinitis due to animal (cat) (dog) hair and dander: Secondary | ICD-10-CM | POA: Diagnosis not present

## 2018-09-10 ENCOUNTER — Other Ambulatory Visit: Payer: Self-pay | Admitting: Cardiology

## 2018-09-15 ENCOUNTER — Telehealth: Payer: Self-pay

## 2018-09-15 NOTE — Telephone Encounter (Signed)

## 2018-09-16 NOTE — Progress Notes (Signed)
Virtual Visit via Video Note   This visit type was conducted due to national recommendations for restrictions regarding the COVID-19 Pandemic (e.g. social distancing) in an effort to limit this patient's exposure and mitigate transmission in our community.  Due to his co-morbid illnesses, this patient is at least at moderate risk for complications without adequate follow up.  This format is felt to be most appropriate for this patient at this time.  All issues noted in this document were discussed and addressed.  A limited physical exam was performed with this format.  Please refer to the patient's chart for his consent to telehealth for [facility].   Evaluation Performed:  Follow-up visit  This visit type was conducted due to national recommendations for restrictions regarding the COVID-19 Pandemic (e.g. social distancing).  This format is felt to be most appropriate for this patient at this time.  All issues noted in this document were discussed and addressed.  No physical exam was performed (except for noted visual exam findings with Video Visits).  Please refer to the patient's chart (MyChart message for video visits and phone note for telephone visits) for the patient's consent to telehealth for [facility].  Date:  09/17/2018   ID:  [name] [name], DOB Jan 30, 1935, MRN [id number]  Patient Location:  Home  Provider location:   [place]  PCP:  Maury Dus, MD  Cardiologist:  Fransico Him, MD  Electrophysiologist:  None   Chief Complaint:  HLD, AS, CAD, PAF  History of Present Illness:    [name] [name] is a 83 y.o. male who presents via audio/video conferencing for a telehealth visit today.    [name] [name] is a 83 y.o. male with a hx of HLD, bicuspid aortic valve with severe AS s/p TAVR 06/2016. LHC at time of TAVR showed non-obstructive CAD in the LAD and ramus with hemodynamically significant RCA stenosis by FFR s/p PCI of the RCA with DES on 06/06/16. He has a hx of  chronic pericardial effusion with extensive workup and unknown etiology.  Last echo a year ago showed complete resolution of effusion. He also has PAF and is on Eliquis for CHADS2VASC score of 4.   Due to symptoms of right heart failure with increased abdominal fullness, weight loss and lower extremity edema and underwent a R/LHCsecondary toconcern forconstrictive pericarditis. R/L cath completed on 05/16/19whichshowed elevated filling pressures suggestive of diastolic CHF, moderate pulmonary hypertension, no evidence of constrictive pericarditis, minimal pressure gradient over prosthetic valve and patent RCA stent. He was noted to be in atrial flutter with rate control and plans were made for DCCV after adequate AC.Pt underwent successful DCCV on 10/27/17.    he is here today for followup and is doing well.  he denies any chest pain or pressure, SOB, DOE, PND, orthopnea, LE edema, dizziness, palpitations or syncope. He is compliant with his meds and is tolerating meds with no SE.    The patient does not have symptoms concerning for COVID-19 infection (fever, chills, cough, or new shortness of breath).    Prior CV studies:   The following studies were reviewed today:  2D echo 2019  Past Medical History:  Diagnosis Date   Actinic keratoses    Dr Glee Arvin   Allergic rhinitis    Aortic stenosis    Bicuspid AV with severe AS s/p TAVR 06/2016   Arthritis of left hip 11/03/2014   Benign essential HTN    Bilateral cataracts    Bladder diverticulum 06/14/2016  BPH (benign prostatic hypertrophy)    Chronic diastolic CHF (congestive heart failure) ([place]) 10/16/2017   Coronary artery disease involving native coronary artery of native heart without angina pectoris    PCI 06/06/16 LAD proximal 60 (FFR 0.85-negative); RI 50; LCx calcified without significant stenosis; RCA mid 80 (FFR 0.68-hemodynamically significant); PCI: 2.25 x 16 mm Synergy DES to the mid RCA   Dilated aortic root  ([place])    50mm by echo 08/2015   Glaucoma    H/O seasonal allergies    Hx of adenomatous colonic polyps 1991 on   Hyperlipidemia    Mitral valve prolapse    mild MR by echo 08/2015   Multifocal pneumonia 12/01/2016   OA (osteoarthritis) of hip 10/16/2016   PAF (paroxysmal atrial fibrillation) (HCC)    S/P DCCV 10/2017   Paresthesia    Chronic left lateral thigh   Pericardial effusion 11/05/2016   Postural dizziness with presyncope 04/23/2017   Pulmonary hypertension, moderate to severe (HCC)    moderate by right heart cath 02/2017   Pulmonary nodules    Past Surgical History:  Procedure Laterality Date   CARDIOVERSION N/A 10/27/2017   Procedure: CARDIOVERSION;  Surgeon: Josue Hector, MD;  Location: [facility] ENDOSCOPY;  Service: Cardiovascular;  Laterality: N/A;   COLONOSCOPY  multiple   CORONARY STENT INTERVENTION N/A 06/06/2016   Procedure: Coronary Stent Intervention;  Surgeon: [name], MD;  Location: [place] CV LAB;  Service: Cardiovascular;  Laterality: N/A;   CYSTOSCOPY  08/12/2016   per patient; showed a cyst on bladder , liver , and kidney , sees urologist Matilde Bash III at [place]     Bilateral cataract removal   INTRAVASCULAR PRESSURE WIRE/FFR STUDY N/A 05/29/2016   Procedure: Intravascular Pressure Wire/FFR Study;  Surgeon: [name], MD;  Location: [place] CV LAB;  Service: Cardiovascular;  Laterality: N/A;   IR THORACENTESIS ASP PLEURAL SPACE W/IMG GUIDE  05/28/2017   RIGHT/LEFT HEART CATH AND CORONARY ANGIOGRAPHY N/A 05/29/2016   Procedure: Right/Left Heart Cath and Coronary Angiography;  Surgeon: [name], MD;  Location: [place] CV LAB;  Service: Cardiovascular;  Laterality: N/A;   RIGHT/LEFT HEART CATH AND CORONARY ANGIOGRAPHY N/A 06/06/2017   Procedure: RIGHT/LEFT HEART CATH AND CORONARY ANGIOGRAPHY;  Surgeon: Larey Dresser, MD;  Location: [place] CV LAB;  Service: Cardiovascular;  Laterality: N/A;    TEE WITHOUT CARDIOVERSION N/A 07/02/2016   Procedure: TRANSESOPHAGEAL ECHOCARDIOGRAM (TEE);  Surgeon: [name], MD;  Location: [place];  Service: Open Heart Surgery;  Laterality: N/A;   TONSILLECTOMY  1949   TOTAL HIP ARTHROPLASTY Left 10/16/2016   Procedure: LEFT TOTAL HIP ARTHROPLASTY ANTERIOR APPROACH;  Surgeon: Gaynelle Arabian, MD;  [facility];  Service: Orthopedics;  Laterality: Left;   TRANSCATHETER AORTIC VALVE REPLACEMENT, TRANSFEMORAL N/A 07/02/2016   Procedure: TRANSCATHETER AORTIC VALVE REPLACEMENT, TRANSFEMORAL;  Surgeon: [name], MD;  Location: [place];  Service: Open Heart Surgery;  Laterality: N/A;     Current Meds  Medication Sig   apixaban (ELIQUIS) 2.5 MG TABS tablet Take 1 tablet (2.5 mg total) by mouth 2 (two) times daily.   azelastine (OPTIVAR) 0.05 % ophthalmic solution Place 1 drop into both eyes 2 (two) times daily.   feeding supplement, ENSURE ENLIVE, (ENSURE ENLIVE) LIQD Take 237 mLs by mouth daily.   Feeding Supplies MISC by Does not apply route.   fluticasone (FLONASE) 50 MCG/ACT nasal spray Place 1 spray into both nostrils at bedtime as needed for allergies.  latanoprost (XALATAN) 0.005 % ophthalmic solution Place 1 drop into both eyes at bedtime.   timolol (BETIMOL) 0.5 % ophthalmic solution Place 1 drop into both eyes 2 (two) times daily.     Allergies:   Patient has no known allergies.   Social History   Tobacco Use   Smoking status: Former Smoker    Packs/day: 1.00    Years: 1.00    Pack years: 1.00    Types: Cigarettes    Quit date: 07/14/1960    Years since quitting: 58.2   Smokeless tobacco: Never Used  Substance Use Topics   Alcohol use: No    Frequency: Never   Drug use: No     Family Hx: The patient's family history includes Emphysema in his mother; Heart attack in his brother; Heart disease in his brother; Pancreatic cancer in his father. There is no history of Colon cancer or Stomach cancer.  ROS:   Please  see the history of present illness.     All other systems reviewed and are negative.   Labs/Other Tests and Data Reviewed:    Recent Labs: 10/21/2017: BUN 33; Creatinine, Ser 1.17; Hemoglobin 14.1; Platelets 195; Potassium 4.9; Sodium 137 05/05/2018: ALT 36   Recent Lipid Panel Lab Results  Component Value Date/Time   CHOL 147 05/05/2018 10:35 AM   TRIG 83 05/05/2018 10:35 AM   HDL 56 05/05/2018 10:35 AM   CHOLHDL 2.6 05/05/2018 10:35 AM   LDLCALC 74 05/05/2018 10:35 AM    Wt Readings from Last 3 Encounters:  09/17/18 118 lb 9.6 oz (53.8 kg)  03/16/18 128 lb (58.1 kg)  11/04/17 124 lb 12.8 oz (56.6 kg)     Objective:    Vital Signs:  BP (!) 105/58 (BP Location: Left Arm, Patient Position: Sitting, Cuff Size: Normal)    Pulse (!) 41    Wt 118 lb 9.6 oz (53.8 kg)    BMI 17.26 kg/m    CONSTITUTIONAL:  Well nourished, well developed male in no acute distress.  EYES: anicteric MOUTH: oral mucosa is pink RESPIRATORY: Normal respiratory effort, symmetric expansion CARDIOVASCULAR: No peripheral edema SKIN: No rash, lesions or ulcers MUSCULOSKELETAL: no digital cyanosis NEURO: Cranial Nerves II-XII grossly intact, moves all extremities PSYCH: Intact judgement and insight.  A&O x 3, Mood/affect appropriate   ASSESSMENT & PLAN:    1.  Bicuspid AV with severe AS - s/p TAVR in 2018.  I will repeat 2D echo since he is 2 years out. No ASA due to DOAC.  2.  HTN - BP controlled. He is not on any antihypertensive meds at this time.   3.  ASCAD - non-obstructive CAD in the LAD and ramus with hemodynamically significant RCA stenosis by FFR s/p PCI of the RCA with DES on 06/06/16.  No anginal sx.  Continue statin.  No ASA due to DOAC.  4.  Pericardial effusion - completely resolved on echo a year ago.  5.  PAF - he is s/p DCCV and thinks that he his maintaining NSR.  Continue on Apixaban 2.5mg  BID (dosed for age >71 and weight < 60kg).  Check BMET and CBC from PCP.    6.  Chronic  diastolic CHF - he has not had any SOB, abdominal fullness or LE edema.  He has not required any diuretics recently.    7.  HLD - LDL near goal at 74 in April 2020.  Continue Pravastatin.   8.  Dizziness - his HR is in the low 40's  likely related to systemic absorption of the Timolol eye gtts.  Stop Timolol eye gtts and notify eye MD of the med change.  2 week Zio patch to assess HR off BB.  Encouraged him to stay hydrated. Instructed him to call if he has any syncope.    COVID-19 Education: The signs and symptoms of COVID-19 were discussed with the patient and how to seek care for testing (follow up with PCP or arrange E-visit).  The importance of social distancing was discussed today.  Patient Risk:   After full review of this patient's clinical status, I feel that they are at least moderate risk at this time.  Time:   Today, I have spent 20 minutes directly with the patient on telemedicine discussing medical problems including CAD, AS, HTN, HLD, CHF, PAF.  We also reviewed the symptoms of COVID 19 and the ways to protect against contracting the virus with telehealth technology.  I spent an additional 5 minutes reviewing patient's chart including 2D echo and labs.  Medication Adjustments/Labs and Tests Ordered: Current medicines are reviewed at length with the patient today.  Concerns regarding medicines are outlined above.  Tests Ordered: No orders of the defined types were placed in this encounter.  Medication Changes: No orders of the defined types were placed in this encounter.   Disposition:  Follow up in 6 month(s) with PA and 1 year with me  Signed, Fransico Him, MD  09/17/2018 10:34 AM    [place]

## 2018-09-17 ENCOUNTER — Encounter: Payer: Self-pay | Admitting: Cardiology

## 2018-09-17 ENCOUNTER — Telehealth (INDEPENDENT_AMBULATORY_CARE_PROVIDER_SITE_OTHER): Payer: Medicare Other | Admitting: Cardiology

## 2018-09-17 ENCOUNTER — Other Ambulatory Visit: Payer: Self-pay

## 2018-09-17 VITALS — BP 105/58 | HR 41 | Wt 118.6 lb

## 2018-09-17 DIAGNOSIS — I5032 Chronic diastolic (congestive) heart failure: Secondary | ICD-10-CM | POA: Diagnosis not present

## 2018-09-17 DIAGNOSIS — I350 Nonrheumatic aortic (valve) stenosis: Secondary | ICD-10-CM

## 2018-09-17 DIAGNOSIS — I495 Sick sinus syndrome: Secondary | ICD-10-CM

## 2018-09-17 DIAGNOSIS — I313 Pericardial effusion (noninflammatory): Secondary | ICD-10-CM

## 2018-09-17 DIAGNOSIS — I2510 Atherosclerotic heart disease of native coronary artery without angina pectoris: Secondary | ICD-10-CM | POA: Diagnosis not present

## 2018-09-17 DIAGNOSIS — I10 Essential (primary) hypertension: Secondary | ICD-10-CM

## 2018-09-17 DIAGNOSIS — E7800 Pure hypercholesterolemia, unspecified: Secondary | ICD-10-CM

## 2018-09-17 DIAGNOSIS — I110 Hypertensive heart disease with heart failure: Secondary | ICD-10-CM

## 2018-09-17 DIAGNOSIS — I3139 Other pericardial effusion (noninflammatory): Secondary | ICD-10-CM

## 2018-09-17 DIAGNOSIS — I480 Paroxysmal atrial fibrillation: Secondary | ICD-10-CM | POA: Diagnosis not present

## 2018-09-17 NOTE — Patient Instructions (Signed)
Medication Instructions:  Your physician has recommended you make the following change in your medication: STOP TIMOLOL EYE DROPS AND PLEASE NOTIFY YOUR EYE PHYSICIAN. DUE TO LOW HEART RATE.  If you need a refill on your cardiac medications before your next appointment, please call your pharmacy.   Lab work: WE ILL GET YOUR LABWORK FROM DR. READE If you have labs (blood work) drawn today and your tests are completely normal, you will receive your results only by: Marland Kitchen MyChart Message (if you have MyChart) OR . A paper copy in the mail If you have any lab test that is abnormal or we need to change your treatment, we will call you to review the results.  Testing/Procedures: WE WILL CALL YOU TO SCHEDULE... Your physician has requested that you have an echocardiogram. Echocardiography is a painless test that uses sound waves to create images of your heart. It provides your doctor with information about the size and shape of your heart and how well your heart's chambers and valves are working. This procedure takes approximately one hour. There are no restrictions for this procedure.  .Your physician has recommended that you wear a Zio Patch. A Zio Patch is a  medical device that records the heart's electrical activity. Doctors most often use these monitors to diagnose arrhythmias. Arrhythmias are problems with the speed or rhythm of the heartbeat. The monitor is a small, portable device. You can wear one while you do your normal daily activities. This is used to diagnose your slow heart rate.      Follow-Up: At [facility], you and your health needs are our priority.  As part of our continuing mission to provide you with exceptional heart care, we have created designated Provider Care Teams.  These Care Teams include your primary Cardiologist (physician) and Advanced Practice Providers (APPs -  Physician Assistants and Nurse Practitioners) who all work together to provide you with the care you need,  when you need it. You will need a follow up appointment in 4 weeks to see one of the following Advanced Practice Providers on your designated Care Team:   [place], PA-C Melina Copa, PA-C . Ermalinda Barrios, PA-C  Your physician wants you to follow-up in: 6 months with Dr. Fransico Him.  You will receive a reminder letter in the mail two months in advance. If you don't receive a letter, please call our office to schedule the follow-up appointment.   Any Other Special Instructions Will Be Listed Below (If Applicable).

## 2018-09-23 ENCOUNTER — Encounter (INDEPENDENT_AMBULATORY_CARE_PROVIDER_SITE_OTHER): Payer: Medicare Other

## 2018-09-23 DIAGNOSIS — I313 Pericardial effusion (noninflammatory): Secondary | ICD-10-CM

## 2018-09-23 DIAGNOSIS — E46 Unspecified protein-calorie malnutrition: Secondary | ICD-10-CM | POA: Diagnosis not present

## 2018-09-23 DIAGNOSIS — R55 Syncope and collapse: Secondary | ICD-10-CM | POA: Diagnosis not present

## 2018-09-23 DIAGNOSIS — I5032 Chronic diastolic (congestive) heart failure: Secondary | ICD-10-CM

## 2018-09-23 DIAGNOSIS — I480 Paroxysmal atrial fibrillation: Secondary | ICD-10-CM

## 2018-09-23 DIAGNOSIS — I350 Nonrheumatic aortic (valve) stenosis: Secondary | ICD-10-CM

## 2018-09-23 DIAGNOSIS — I3139 Other pericardial effusion (noninflammatory): Secondary | ICD-10-CM

## 2018-09-23 DIAGNOSIS — I5030 Unspecified diastolic (congestive) heart failure: Secondary | ICD-10-CM | POA: Diagnosis not present

## 2018-09-23 DIAGNOSIS — I10 Essential (primary) hypertension: Secondary | ICD-10-CM

## 2018-09-23 DIAGNOSIS — E7800 Pure hypercholesterolemia, unspecified: Secondary | ICD-10-CM | POA: Diagnosis not present

## 2018-09-23 DIAGNOSIS — I2510 Atherosclerotic heart disease of native coronary artery without angina pectoris: Secondary | ICD-10-CM

## 2018-09-23 DIAGNOSIS — I951 Orthostatic hypotension: Secondary | ICD-10-CM | POA: Diagnosis not present

## 2018-09-23 DIAGNOSIS — Z23 Encounter for immunization: Secondary | ICD-10-CM | POA: Diagnosis not present

## 2018-09-23 DIAGNOSIS — I495 Sick sinus syndrome: Secondary | ICD-10-CM

## 2018-09-25 ENCOUNTER — Encounter (HOSPITAL_COMMUNITY): Payer: Self-pay | Admitting: Cardiology

## 2018-09-30 DIAGNOSIS — H401111 Primary open-angle glaucoma, right eye, mild stage: Secondary | ICD-10-CM | POA: Diagnosis not present

## 2018-09-30 DIAGNOSIS — H401123 Primary open-angle glaucoma, left eye, severe stage: Secondary | ICD-10-CM | POA: Diagnosis not present

## 2018-09-30 DIAGNOSIS — H1851 Endothelial corneal dystrophy: Secondary | ICD-10-CM | POA: Diagnosis not present

## 2018-09-30 DIAGNOSIS — H0100A Unspecified blepharitis right eye, upper and lower eyelids: Secondary | ICD-10-CM | POA: Diagnosis not present

## 2018-10-02 ENCOUNTER — Other Ambulatory Visit: Payer: Self-pay

## 2018-10-02 ENCOUNTER — Telehealth: Payer: Self-pay

## 2018-10-02 ENCOUNTER — Ambulatory Visit (HOSPITAL_COMMUNITY): Payer: Medicare Other | Attending: Cardiovascular Disease

## 2018-10-02 DIAGNOSIS — I10 Essential (primary) hypertension: Secondary | ICD-10-CM | POA: Insufficient documentation

## 2018-10-02 DIAGNOSIS — I3139 Other pericardial effusion (noninflammatory): Secondary | ICD-10-CM

## 2018-10-02 DIAGNOSIS — E7800 Pure hypercholesterolemia, unspecified: Secondary | ICD-10-CM | POA: Diagnosis not present

## 2018-10-02 DIAGNOSIS — I313 Pericardial effusion (noninflammatory): Secondary | ICD-10-CM | POA: Insufficient documentation

## 2018-10-02 DIAGNOSIS — I480 Paroxysmal atrial fibrillation: Secondary | ICD-10-CM | POA: Insufficient documentation

## 2018-10-02 DIAGNOSIS — I5032 Chronic diastolic (congestive) heart failure: Secondary | ICD-10-CM | POA: Insufficient documentation

## 2018-10-02 DIAGNOSIS — I350 Nonrheumatic aortic (valve) stenosis: Secondary | ICD-10-CM | POA: Diagnosis not present

## 2018-10-02 DIAGNOSIS — I2510 Atherosclerotic heart disease of native coronary artery without angina pectoris: Secondary | ICD-10-CM | POA: Diagnosis not present

## 2018-10-02 NOTE — Telephone Encounter (Signed)
-----   Message from Sueanne Margarita, MD sent at 10/02/2018  3:34 PM EDT ----- Echo showed nromal LVF with increased stiffness of heart muscle, mildly enlarged RA, mild MR and TR and AR.  Stable TAVR.  Mildly dilated aorta

## 2018-10-02 NOTE — Telephone Encounter (Signed)
Notes recorded by Frederik Schmidt, RN on 10/02/2018 at 3:39 PM EDT  The patient has been notified of the Echo result and verbalized understanding. All questions (if any) were answered.  Frederik Schmidt, RN 10/02/2018 3:38 PM

## 2018-10-13 ENCOUNTER — Other Ambulatory Visit: Payer: Self-pay

## 2018-10-19 ENCOUNTER — Telehealth: Payer: Self-pay

## 2018-10-19 DIAGNOSIS — R9431 Abnormal electrocardiogram [ECG] [EKG]: Secondary | ICD-10-CM

## 2018-10-19 NOTE — Telephone Encounter (Signed)
Spoke with pt regarding appt on 10/23/18. Pt was advise to check his vitals prior to his appt. Pt questions were address.

## 2018-10-19 NOTE — Telephone Encounter (Signed)
Notes recorded by Frederik Schmidt, RN on 10/19/2018 at 10:08 AM EDT  The patient has been notified of the monitor result and verbalized understanding. All questions (if any) were answered.  Frederik Schmidt, RN 10/19/2018 10:08 AM   EP referral submitted per Dr Radford Pax

## 2018-10-19 NOTE — Telephone Encounter (Signed)
-----   Message from Sueanne Margarita, MD sent at 10/19/2018  9:50 AM EDT ----- Patient;s event monitor showed NSVT vs aberration as well as SVT likely Paroxysmal atrial flutter with 2:1 block with HRs as high as 197bpm.  Please get into EP this week for further evaluation.  He has had problems with bradycardia and is off all rate controlling meds.

## 2018-10-23 ENCOUNTER — Telehealth (INDEPENDENT_AMBULATORY_CARE_PROVIDER_SITE_OTHER): Payer: Medicare Other | Admitting: Internal Medicine

## 2018-10-23 ENCOUNTER — Encounter: Payer: Self-pay | Admitting: Internal Medicine

## 2018-10-23 VITALS — BP 103/60 | HR 75 | Ht 69.5 in | Wt 125.0 lb

## 2018-10-23 DIAGNOSIS — I495 Sick sinus syndrome: Secondary | ICD-10-CM

## 2018-10-23 DIAGNOSIS — I4729 Other ventricular tachycardia: Secondary | ICD-10-CM

## 2018-10-23 DIAGNOSIS — I483 Typical atrial flutter: Secondary | ICD-10-CM

## 2018-10-23 DIAGNOSIS — I472 Ventricular tachycardia: Secondary | ICD-10-CM | POA: Diagnosis not present

## 2018-10-23 NOTE — Progress Notes (Signed)
Electrophysiology TeleHealth Note   Due to national recommendations of social distancing due to [place] 19, Audio  telehealth visit is felt to be most appropriate for this patient at this time.  Verbal consent was obtained today.   Date:  10/23/2018   ID:  [name] [name], DOB November 19, 1935, MRN [id number]  Location: home Provider location: [address] [place] Evaluation Performed: New patient consult  PCP:  Maury Dus, MD  Cardiologist:  Fransico Him, MD Electrophysiologist:  None   Chief Complaint:  Atrial flutter  History of Present Illness:    [name] [name] is a 83 y.o. male who presents via audio  conferencing for a telehealth visit today.   The patient is referred for new consultation regarding arrhythmias by Dr Radford Pax.  The patient has a history of typical appearing atrial flutter (reviewed on ekg from 10/2017).  He carries a diagnosis of afib, though I do not see this well documented.  He also has a h/o prior TAVR (2018), chronic pericardial effusion, and ongoing unexplained weight loss. HE was recently evaluated by Dr Radford Pax (her notes reviewed) and noted to have episodes of dizziness.  A Zio monitor was placed. On my history today, he denies dizziness, presyncope, or syncope.  He reports that about twice per month he has unsteadiness which he describes as falling towards the R side. He denies symptoms of tachypalpitations or arrhythmia.   Today, he denies symptoms of palpitations, chest pain, shortness of breath, orthopnea, PND, lower extremity edema, claudication,presyncope, syncope, bleeding, or neurologic sequela. The patient is tolerating medications without difficulties and is otherwise without complaint today.     Past Medical History:  Diagnosis Date  . Actinic keratoses    Dr Glee Arvin  . Allergic rhinitis   . Aortic stenosis    Bicuspid AV with severe AS s/p TAVR 06/2016  . Arthritis of left hip 11/03/2014  . Benign essential HTN   .  Bilateral cataracts   . 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 BPH (benign prostatic hypertrophy)   . Chronic diastolic CHF (congestive heart failure) ([place]) 10/16/2017  . Coronary artery disease involving native coronary artery of native heart without angina pectoris    PCI 06/06/16 LAD proximal 60 (FFR 0.85-negative); RI 50; LCx calcified without significant stenosis; RCA mid 80 (FFR 0.68-hemodynamically significant); PCI: 2.25 x 16 mm Synergy DES to the mid RCA  . Dilated aortic root ([place])    68mm by echo 08/2015  . Glaucoma   . H/O seasonal allergies   . Hx of adenomatous colonic polyps 1991 on  . Hyperlipidemia   . Mitral valve prolapse    mild MR by echo 08/2015  . Multifocal pneumonia 12/01/2016  . OA (osteoarthritis) of hip 10/16/2016  . PAF (paroxysmal atrial fibrillation) ([place])    S/P DCCV 10/2017  . Paresthesia    Chronic left lateral thigh  . Pericardial effusion 11/05/2016  . Postural dizziness with presyncope 04/23/2017  . Pulmonary hypertension, moderate to severe (HCC)    moderate by right heart cath 02/2017  . Pulmonary nodules     Past Surgical History:  Procedure Laterality Date  . CARDIOVERSION N/A 10/27/2017   Procedure: CARDIOVERSION;  Surgeon: Josue Hector, MD;  Location: [facility] ENDOSCOPY;  Service: Cardiovascular;  Laterality: N/A;  . COLONOSCOPY  multiple  . CORONARY STENT INTERVENTION N/A 06/06/2016   Procedure: Coronary Stent Intervention;  Surgeon: [name], MD;  Location: [place] CV LAB;  Service: Cardiovascular;  Laterality: N/A;  .  CYSTOSCOPY  08/12/2016   per patient; showed a cyst on bladder , liver , and kidney , sees urologist Matilde Bash III at [facility]  . EYE SURGERY     Bilateral cataract removal  . INTRAVASCULAR PRESSURE WIRE/FFR STUDY N/A 05/29/2016   Procedure: Intravascular Pressure Wire/FFR Study;  Surgeon: [name], MD;  Location: [place] CV LAB;  Service: Cardiovascular;  Laterality: N/A;  . IR THORACENTESIS ASP PLEURAL SPACE  W/IMG GUIDE  05/28/2017  . RIGHT/LEFT HEART CATH AND CORONARY ANGIOGRAPHY N/A 05/29/2016   Procedure: Right/Left Heart Cath and Coronary Angiography;  Surgeon: [name], MD;  Location: [place] CV LAB;  Service: Cardiovascular;  Laterality: N/A;  . RIGHT/LEFT HEART CATH AND CORONARY ANGIOGRAPHY N/A 06/06/2017   Procedure: RIGHT/LEFT HEART CATH AND CORONARY ANGIOGRAPHY;  Surgeon: Larey Dresser, MD;  Location: [place] CV LAB;  Service: Cardiovascular;  Laterality: N/A;  . TEE WITHOUT CARDIOVERSION N/A 07/02/2016   Procedure: TRANSESOPHAGEAL ECHOCARDIOGRAM (TEE);  Surgeon: [name], MD;  Location: [place];  Service: Open Heart Surgery;  Laterality: N/A;  . TONSILLECTOMY  1949  . TOTAL HIP ARTHROPLASTY Left 10/16/2016   Procedure: LEFT TOTAL HIP ARTHROPLASTY ANTERIOR APPROACH;  Surgeon: Gaynelle Arabian, MD;  Location: [facility] ORS;  Service: Orthopedics;  Laterality: Left;  . TRANSCATHETER AORTIC VALVE REPLACEMENT, TRANSFEMORAL N/A 07/02/2016   Procedure: TRANSCATHETER AORTIC VALVE REPLACEMENT, TRANSFEMORAL;  Surgeon: [name], MD;  Location: [place];  Service: Open Heart Surgery;  Laterality: N/A;    Current Outpatient Medications  Medication Sig Dispense Refill  . apixaban (ELIQUIS) 2.5 MG TABS tablet Take 1 tablet (2.5 mg total) by mouth 2 (two) times daily. 180 tablet 1  . azelastine (OPTIVAR) 0.05 % ophthalmic solution Place 1 drop into both eyes 2 (two) times daily.    . feeding supplement, ENSURE ENLIVE, (ENSURE ENLIVE) LIQD Take 237 mLs by mouth daily. 30 Bottle 0  . Feeding Supplies MISC by Does not apply route.    . fluticasone (FLONASE) 50 MCG/ACT nasal spray Place 1 spray into both nostrils at bedtime as needed for allergies.     Marland Kitchen latanoprost (XALATAN) 0.005 % ophthalmic solution Place 1 drop into both eyes at bedtime.    . pravastatin (PRAVACHOL) 40 MG tablet Take 40 mg by mouth daily.     No current facility-administered medications for this visit.     Allergies:    Patient has no known allergies.   Social History:  The patient  reports that he quit smoking about 58 years ago. His smoking use included cigarettes. He has a 1.00 pack-year smoking history. He has never used smokeless tobacco. He reports that he does not drink alcohol or use drugs.   Family History:  The patient's family history includes Emphysema in his mother; Heart attack in his brother; Heart disease in his brother; Pancreatic cancer in his father.    ROS:  Please see the history of present illness.   All other systems are personally reviewed and negative.    Exam:    Vital Signs:  BP 103/60   Pulse 75   Ht 5' 9.5" (1.765 m)   Wt 125 lb (56.7 kg)   BMI 18.19 kg/m    Well sounding, alert and conversant neuro- grossly intact, skin- no apparent rash or lesions or cyanosis, mouth- oral mucosa is pink   Labs/Other Tests and Data Reviewed:    Recent Labs: 05/05/2018: ALT 36   Wt Readings from Last 3 Encounters:  10/23/18 125 lb (56.7 kg)  09/17/18 118 lb 9.6 oz (53.8 kg)  03/16/18 128 lb (58.1 kg)     Other studies personally reviewed: Additional studies/ records that were reviewed today include: recent event monitor is personally reviewed, prior ekgs, Dr Landis Gandy office notes  Review of the above records today demonstrates: as above    ASSESSMENT & PLAN:    1.  Atrial flutter The patient has a h/o typical appearing atrial flutter with minimal symptoms.  He is anticoagulated with eliquis.  He carries a diagnosis of atrial fibrillation though I do not see this well documented. His recent event monitor did reveal short episodes of atrial flutter, though with V rate elevation for which he was asymptomatic. I have offered atrial flutter ablation today.  He declines.  He states that given paucity of symptoms, prevalence of COVID 39, and his advanced age, he would prefer a conservative approach.  If he develops symptoms of his arrhythmia then he may reconsider. Given that he is  asymptomatic and carries a h/o bradycardia, I would not advise increased rate control or AAD therapy at this time.  2. Sinus bradycardia Asymptomatic No indication for pacing currently  3. NSVT asymptomatic EF is normal No further workup is planned  Follow-up:  3 months with EP APP  Current medicines are reviewed at length with the patient today.   The patient does not have concerns regarding his medicines.  The following changes were made today:  none  Labs/ tests ordered today include:  No orders of the defined types were placed in this encounter.   Patient Risk:  after full review of this patients clinical status, I feel that they are at moderate risk at this time.   Today, I have spent 20 minutes with the patient with telehealth technology discussing arrhythmias .    Signed, Thompson Grayer MD, [place] 10/23/2018 10:21 AM   [facility] [address] [place] [address] [phone] (office) [phone] (fax)

## 2018-11-04 DIAGNOSIS — H0100A Unspecified blepharitis right eye, upper and lower eyelids: Secondary | ICD-10-CM | POA: Diagnosis not present

## 2018-11-04 DIAGNOSIS — H18513 Endothelial corneal dystrophy, bilateral: Secondary | ICD-10-CM | POA: Diagnosis not present

## 2018-11-04 DIAGNOSIS — H401123 Primary open-angle glaucoma, left eye, severe stage: Secondary | ICD-10-CM | POA: Diagnosis not present

## 2018-11-04 DIAGNOSIS — H401111 Primary open-angle glaucoma, right eye, mild stage: Secondary | ICD-10-CM | POA: Diagnosis not present

## 2018-11-19 DIAGNOSIS — J3081 Allergic rhinitis due to animal (cat) (dog) hair and dander: Secondary | ICD-10-CM | POA: Diagnosis not present

## 2018-11-19 DIAGNOSIS — J3089 Other allergic rhinitis: Secondary | ICD-10-CM | POA: Diagnosis not present

## 2018-11-19 DIAGNOSIS — J301 Allergic rhinitis due to pollen: Secondary | ICD-10-CM | POA: Diagnosis not present

## 2018-11-23 DIAGNOSIS — J309 Allergic rhinitis, unspecified: Secondary | ICD-10-CM | POA: Diagnosis not present

## 2018-11-23 DIAGNOSIS — R0982 Postnasal drip: Secondary | ICD-10-CM | POA: Diagnosis not present

## 2018-11-23 DIAGNOSIS — J209 Acute bronchitis, unspecified: Secondary | ICD-10-CM | POA: Diagnosis not present

## 2018-11-26 DIAGNOSIS — J3089 Other allergic rhinitis: Secondary | ICD-10-CM | POA: Diagnosis not present

## 2018-11-26 DIAGNOSIS — J301 Allergic rhinitis due to pollen: Secondary | ICD-10-CM | POA: Diagnosis not present

## 2018-11-26 DIAGNOSIS — J3081 Allergic rhinitis due to animal (cat) (dog) hair and dander: Secondary | ICD-10-CM | POA: Diagnosis not present

## 2018-11-30 DIAGNOSIS — J3089 Other allergic rhinitis: Secondary | ICD-10-CM | POA: Diagnosis not present

## 2018-11-30 DIAGNOSIS — J301 Allergic rhinitis due to pollen: Secondary | ICD-10-CM | POA: Diagnosis not present

## 2018-11-30 DIAGNOSIS — J3081 Allergic rhinitis due to animal (cat) (dog) hair and dander: Secondary | ICD-10-CM | POA: Diagnosis not present

## 2018-12-04 DIAGNOSIS — J3081 Allergic rhinitis due to animal (cat) (dog) hair and dander: Secondary | ICD-10-CM | POA: Diagnosis not present

## 2018-12-04 DIAGNOSIS — J301 Allergic rhinitis due to pollen: Secondary | ICD-10-CM | POA: Diagnosis not present

## 2018-12-04 DIAGNOSIS — J3089 Other allergic rhinitis: Secondary | ICD-10-CM | POA: Diagnosis not present

## 2018-12-07 DIAGNOSIS — J3089 Other allergic rhinitis: Secondary | ICD-10-CM | POA: Diagnosis not present

## 2018-12-07 DIAGNOSIS — J309 Allergic rhinitis, unspecified: Secondary | ICD-10-CM | POA: Diagnosis not present

## 2018-12-07 DIAGNOSIS — J3081 Allergic rhinitis due to animal (cat) (dog) hair and dander: Secondary | ICD-10-CM | POA: Diagnosis not present

## 2018-12-07 DIAGNOSIS — J301 Allergic rhinitis due to pollen: Secondary | ICD-10-CM | POA: Diagnosis not present

## 2018-12-07 DIAGNOSIS — R05 Cough: Secondary | ICD-10-CM | POA: Diagnosis not present

## 2018-12-09 DIAGNOSIS — J3081 Allergic rhinitis due to animal (cat) (dog) hair and dander: Secondary | ICD-10-CM | POA: Diagnosis not present

## 2018-12-09 DIAGNOSIS — J3089 Other allergic rhinitis: Secondary | ICD-10-CM | POA: Diagnosis not present

## 2018-12-09 DIAGNOSIS — J301 Allergic rhinitis due to pollen: Secondary | ICD-10-CM | POA: Diagnosis not present

## 2018-12-14 DIAGNOSIS — J3081 Allergic rhinitis due to animal (cat) (dog) hair and dander: Secondary | ICD-10-CM | POA: Diagnosis not present

## 2018-12-14 DIAGNOSIS — J301 Allergic rhinitis due to pollen: Secondary | ICD-10-CM | POA: Diagnosis not present

## 2018-12-14 DIAGNOSIS — J3089 Other allergic rhinitis: Secondary | ICD-10-CM | POA: Diagnosis not present

## 2018-12-15 ENCOUNTER — Other Ambulatory Visit: Payer: Self-pay

## 2018-12-15 ENCOUNTER — Encounter: Payer: Self-pay | Admitting: Pulmonary Disease

## 2018-12-15 ENCOUNTER — Ambulatory Visit (INDEPENDENT_AMBULATORY_CARE_PROVIDER_SITE_OTHER): Payer: Medicare Other | Admitting: Pulmonary Disease

## 2018-12-15 DIAGNOSIS — D7219 Other eosinophilia: Secondary | ICD-10-CM

## 2018-12-15 DIAGNOSIS — J45909 Unspecified asthma, uncomplicated: Secondary | ICD-10-CM | POA: Insufficient documentation

## 2018-12-15 DIAGNOSIS — J4541 Moderate persistent asthma with (acute) exacerbation: Secondary | ICD-10-CM | POA: Diagnosis not present

## 2018-12-15 DIAGNOSIS — Z0000 Encounter for general adult medical examination without abnormal findings: Secondary | ICD-10-CM

## 2018-12-15 DIAGNOSIS — R768 Other specified abnormal immunological findings in serum: Secondary | ICD-10-CM

## 2018-12-15 DIAGNOSIS — Z9109 Other allergy status, other than to drugs and biological substances: Secondary | ICD-10-CM | POA: Diagnosis not present

## 2018-12-15 MED ORDER — MONTELUKAST SODIUM 10 MG PO TABS: 10.0000 mg | ORAL_TABLET | Freq: Every day | ORAL | 3 refills | Status: DC

## 2018-12-15 MED ORDER — AMOXICILLIN-POT CLAVULANATE 875-125 MG PO TABS: 1.0000 | ORAL_TABLET | Freq: Two times a day (BID) | ORAL | 0 refills | Status: DC

## 2018-12-15 MED ORDER — PREDNISONE 10 MG PO TABS: ORAL_TABLET | ORAL | 0 refills | Status: DC

## 2018-12-15 MED ORDER — BREO ELLIPTA 200-25 MCG/INH IN AEPB: 1.0000 | INHALATION_SPRAY | Freq: Every day | RESPIRATORY_TRACT | 0 refills | Status: DC

## 2018-12-15 NOTE — Assessment & Plan Note (Signed)
Plan: Continue follow-up with Dr. Donneta Romberg  I will route my notes to him as well

## 2018-12-15 NOTE — Assessment & Plan Note (Signed)
Suspected asthma exacerbation today Elevated IgE and peripheral eosinophil count on past  Plan: Resume Breo Ellipta 200, sample provided today Prednisone taper today Augmentin today Start Singulair Start daily antihistamine Start nasal saline rinses 1-2 times daily Continue Flonase 1 spray each nostril 2-week follow-up in office

## 2018-12-15 NOTE — Assessment & Plan Note (Signed)
Plan: We will request records from [facility] walk-in clinic to obtain your negative Covid test

## 2018-12-15 NOTE — Patient Instructions (Addendum)
You were seen today by Lauraine Rinne, NP  for:   1. Moderate persistent extrinsic asthma with acute exacerbation  - amoxicillin-clavulanate (AUGMENTIN) 875-125 MG tablet; Take 1 tablet by mouth 2 (two) times daily.  Dispense: 14 tablet; Refill: 0 - predniSONE (DELTASONE) 10 MG tablet; 4 tabs for 2 days, then 3 tabs for 2 days, 2 tabs for 2 days, then 1 tab for 2 days, then stop  Dispense: 20 tablet; Refill: 0  RESTART Breo Ellipta 200 >>> Take 1 puff daily in the morning right when you wake up >>>Rinse your mouth out after use >>>This is a daily maintenance inhaler, NOT a rescue inhaler >>>Contact our office if you are having difficulties affording or obtaining this medication >>>It is important for you to be able to take this daily and not miss any doses   Please start taking a daily antihistamine:  >>>choose one of: zyrtec, claritin, allegra, or xyzal  >>>these are over the counter medications  >>>can choose generic option  >>>take daily  >>>this medication helps with allergies, post nasal drip, and cough   Start nasal saline rinses 1-2 times daily, prior to Flonase use    2. Elevated IgE level  - predniSONE (DELTASONE) 10 MG tablet; 4 tabs for 2 days, then 3 tabs for 2 days, 2 tabs for 2 days, then 1 tab for 2 days, then stop  Dispense: 20 tablet; Refill: 0 - montelukast (SINGULAIR) 10 MG tablet; Take 1 tablet (10 mg total) by mouth at bedtime.  Dispense: 30 tablet; Refill: 3  3. Multiple environmental allergies  - predniSONE (DELTASONE) 10 MG tablet; 4 tabs for 2 days, then 3 tabs for 2 days, 2 tabs for 2 days, then 1 tab for 2 days, then stop  Dispense: 20 tablet; Refill: 0 - montelukast (SINGULAIR) 10 MG tablet; Take 1 tablet (10 mg total) by mouth at bedtime.  Dispense: 30 tablet; Refill: 3  4. Allergy to mold spores  - predniSONE (DELTASONE) 10 MG tablet; 4 tabs for 2 days, then 3 tabs for 2 days, 2 tabs for 2 days, then 1 tab for 2 days, then stop  Dispense: 20 tablet;  Refill: 0  5. Eosinophilic leukocytosis, unspecified type  - predniSONE (DELTASONE) 10 MG tablet; 4 tabs for 2 days, then 3 tabs for 2 days, 2 tabs for 2 days, then 1 tab for 2 days, then stop  Dispense: 20 tablet; Refill: 0 - montelukast (SINGULAIR) 10 MG tablet; Take 1 tablet (10 mg total) by mouth at bedtime.  Dispense: 30 tablet; Refill: 3   We recommend today:  No orders of the defined types were placed in this encounter.  No orders of the defined types were placed in this encounter.  Meds ordered this encounter  Medications  . amoxicillin-clavulanate (AUGMENTIN) 875-125 MG tablet    Sig: Take 1 tablet by mouth 2 (two) times daily.    Dispense:  14 tablet    Refill:  0  . predniSONE (DELTASONE) 10 MG tablet    Sig: 4 tabs for 2 days, then 3 tabs for 2 days, 2 tabs for 2 days, then 1 tab for 2 days, then stop    Dispense:  20 tablet    Refill:  0  . montelukast (SINGULAIR) 10 MG tablet    Sig: Take 1 tablet (10 mg total) by mouth at bedtime.    Dispense:  30 tablet    Refill:  3    Follow Up:    Return in about  2 weeks (around 12/29/2018), or if symptoms worsen or fail to improve, for Follow up with Dr. Purnell Shoemaker, Follow up with Wyn Quaker FNP-C.   Please do your part to reduce the spread of COVID-19:      Reduce your risk of any infection  and COVID19 by using the similar precautions used for avoiding the common cold or flu:  Marland Kitchen Wash your hands often with soap and warm water for at least 20 seconds.  If soap and water are not readily available, use an alcohol-based hand sanitizer with at least 60% alcohol.  . If coughing or sneezing, cover your mouth and nose by coughing or sneezing into the elbow areas of your shirt or coat, into a tissue or into your sleeve (not your hands). Langley Gauss A MASK when in public  . Avoid shaking hands with others and consider head nods or verbal greetings only. . Avoid touching your eyes, nose, or mouth with unwashed hands.  . Avoid close  contact with people who are sick. . Avoid places or events with large numbers of people in one location, like concerts or sporting events. . If you have some symptoms but not all symptoms, continue to monitor at home and seek medical attention if your symptoms worsen. . If you are having a medical emergency, call 911.   [place] / e-Visit: [email or web address]         [facility]: [facility]: [id number]                   [facility]: [id number]     It is flu season:   >>> Best ways to protect herself from the flu: Receive the yearly flu vaccine, practice good hand hygiene washing with soap and also using hand sanitizer when available, eat a nutritious meals, get adequate rest, hydrate appropriately   Please contact the office if your symptoms worsen or you have concerns that you are not improving.   Thank you for choosing [facility] for your healthcare, and for allowing [place] to partner with you on your healthcare journey. I am thankful to be able to provide care to you today.   Wyn Quaker FNP-C

## 2018-12-15 NOTE — Assessment & Plan Note (Signed)
Plan: Prednisone taper today Start Singulair Start daily antihistamine Start nasal saline rinses

## 2018-12-15 NOTE — Assessment & Plan Note (Signed)
Plan: Continue follow-up with allergy asthma

## 2018-12-15 NOTE — Assessment & Plan Note (Addendum)
Plan: Start Singulair Start nasal saline rinses Start daily antihistamine

## 2018-12-15 NOTE — Progress Notes (Signed)
Virtual Visit via Telephone Note  I connected with [name] [name] on 12/15/18 at  1:30 PM EST by telephone and verified that I am speaking with the correct person using two identifiers.  Location: Patient: Home Provider: [facility] - [id number], [place], [place], [place] [address]   I discussed the limitations, risks, security and privacy concerns of performing an evaluation and management service by telephone and the availability of in person appointments. I also discussed with the patient that there may be a patient responsible charge related to this service. The patient expressed understanding and agreed to proceed.  Patient consented to consult via telephone: Yes People present and their role in pt care: Pt   History of Present Illness:  83 year old male former smoker followed in our office for asthma (also followed by Dr. Donneta Romberg)  Past medical history: Hyperlipidemia, arthritis, hypertension, status post TAVR, A. fib, CHF Smoking history: Former smoker.  Quit 1962.  1 pack year smoking history Maintenance: Breo Ellipta  Patient of Dr. Chase Caller  Chief complaint: Cough Congestion   83 year old male former smoker followed in our office for asthma.  Patient was significantly elevated IgE in the past.  Patient also with elevated peripheral eosinophil count in the past.  Patient was last seen October/2019 by Dr. Chase Caller.  Patient was doing well managed on Breo Ellipta.  He reports that he ran out of his inhaler in June/2020.  Also in June/2020 he started to have increased cough and congestion.  No wheezing per patient.  His cough produces gray to yellow mucus.  He also has persistent rhinorrhea and nasal drainage.  He is not currently taking a daily antihistamine or Singulair.  He does take Flonase.  He was treated with doxycycline 1 month ago and he did not feel that this helped.  He was also treating with steroids this did not help.  He was recently treated at a  walk-in clinic at [facility] and was tested for SARS-CoV-2 and found to be negative.   Observations/Objective:  10/15/2017-respiratory allergy profile-elevations across the panel, largest elevations being allergen P noted tearing, Aspergillus fumigatus, cat dander, Timothy grass, IgE 2897  10/10/2017-[id number]  10/21/2017-CBC with differential-eosinophils relative 5, eosinophils absolute 0.4  09/29/2017-CT chest high-res-no findings of ILD, mild cylindrical bronchiectasis, multiple tiny 1 to 2 mm pulmonary nodules bilaterally, nonspecific, statistically benign, no follow-up needed if patient is low risk, noncontrast chest CT can be considered in 12 months the patient is high risk  05/28/2017-diagnostic and therapeutic left thoracentesis-1 L fluid removed  10/02/2018-echocardiogram-LV ejection fraction 60 to 65%, right ventricle has normal systolic function, mildly elevated pulmonary artery systolic pressure   Social History   Tobacco Use  Smoking Status Former Smoker  . Packs/day: 1.00  . Years: 1.00  . Pack years: 1.00  . Types: Cigarettes  . Quit date: 07/14/1960  . Years since quitting: 58.4  Smokeless Tobacco Never Used   Immunization History  Administered Date(s) Administered  . Influenza, High Dose Seasonal PF 10/17/2016, 10/24/2017    Assessment and Plan:  Extrinsic asthma Suspected asthma exacerbation today Elevated IgE and peripheral eosinophil count on past  Plan: Resume Breo Ellipta 200, sample provided today Prednisone taper today Augmentin today Start Singulair Start daily antihistamine Start nasal saline rinses 1-2 times daily Continue Flonase 1 spray each nostril 2-week follow-up in office  Allergy to mold spores Plan: Prednisone taper today Start Singulair Start daily antihistamine Start nasal saline rinses  Elevated IgE level Plan: Start  Singulair Start nasal saline rinses Start daily antihistamine   Multiple environmental  allergies Plan: Continue follow-up with Dr. Donneta Romberg  I will route my notes to him as well  Eosinophilic leukocytosis Plan: Continue follow-up with allergy asthma  Healthcare maintenance Plan: We will request records from [facility] walk-in clinic to obtain your negative Covid test   Follow Up Instructions:  Return in about 2 weeks (around 12/29/2018), or if symptoms worsen or fail to improve, for Follow up with Dr. Purnell Shoemaker, Follow up with Wyn Quaker FNP-C.   I discussed the assessment and treatment plan with the patient. The patient was provided an opportunity to ask questions and all were answered. The patient agreed with the plan and demonstrated an understanding of the instructions.   The patient was advised to call back or seek an in-person evaluation if the symptoms worsen or if the condition fails to improve as anticipated.  I provided 26 minutes of non-face-to-face time during this encounter.   Lauraine Rinne, NP

## 2018-12-15 NOTE — Addendum Note (Signed)
Addended by: Valerie Salts on: 12/15/2018 01:42 PM   Modules accepted: Orders

## 2018-12-16 DIAGNOSIS — J301 Allergic rhinitis due to pollen: Secondary | ICD-10-CM | POA: Diagnosis not present

## 2018-12-16 DIAGNOSIS — J3081 Allergic rhinitis due to animal (cat) (dog) hair and dander: Secondary | ICD-10-CM | POA: Diagnosis not present

## 2018-12-16 DIAGNOSIS — J3089 Other allergic rhinitis: Secondary | ICD-10-CM | POA: Diagnosis not present

## 2018-12-21 DIAGNOSIS — J3081 Allergic rhinitis due to animal (cat) (dog) hair and dander: Secondary | ICD-10-CM | POA: Diagnosis not present

## 2018-12-21 DIAGNOSIS — J3089 Other allergic rhinitis: Secondary | ICD-10-CM | POA: Diagnosis not present

## 2018-12-21 DIAGNOSIS — J301 Allergic rhinitis due to pollen: Secondary | ICD-10-CM | POA: Diagnosis not present

## 2018-12-28 NOTE — Progress Notes (Signed)
@Patient  ID: [name] [name], male    DOB: [birth year], 83 y.o.   MRN: [id number]  Chief Complaint  Patient presents with  . Follow-up    2WK F/U    Referring provider: Maury Dus, MD  HPI:  83 year old male former smoker followed in our office for asthma (also followed by Dr. Donneta Romberg)  Past medical history: Hyperlipidemia, arthritis, hypertension, status post TAVR, A. fib, CHF Smoking history: Former smoker.  Quit 1962.  1 pack year smoking history Maintenance: Adair Patter  Patient of Dr. Chase Caller  12/29/2018  - Visit   83 year old male former smoker followed in our office for asthma.  Patient also has blood work from 2019 that shows significantly elevated IgE, multiple environmental allergies, and peripheral eosinophilia.  Patient is also followed by Dr. Donneta Romberg with allergy.  He reports that he receives monthly allergy shots.  Patient is presenting today for a 2-week follow-up after being treated telephonically as an asthma exacerbation.  Patient has restarted his Breo Ellipta 200 inhaler as well as started Singulair daily.  Patient reports that he feels significantly improved since being treated with prednisone and antibiotics.  Overall patient feels that he is at his baseline today.  ACT score today is 19.  mMRC 0.  Questionaires / Pulmonary Flowsheets:   ACT:  Asthma Control Test ACT Total Score  12/29/2018 19     mMRC Dyspnea Scale mMRC Score  12/29/2018 0    Tests:   10/15/2017-respiratory allergy profile-elevations across the panel, largest elevations being allergen P noted tearing, Aspergillus fumigatus, cat dander, Timothy grass, IgE 2897  10/10/2017-[id number]  10/21/2017-CBC with differential-eosinophils relative 5, eosinophils absolute 0.4  09/29/2017-CT chest high-res-no findings of ILD, mild cylindrical bronchiectasis, multiple tiny 1 to 2 mm pulmonary nodules bilaterally, nonspecific, statistically benign, no follow-up needed if patient is low risk,  noncontrast chest CT can be considered in 12 months the patient is high risk  05/28/2017-diagnostic and therapeutic left thoracentesis-1 L fluid removed  10/02/2018-echocardiogram-LV ejection fraction 60 to 65%, right ventricle has normal systolic function, mildly elevated pulmonary artery systolic pressure   FENO:  Lab Results  Component Value Date   NITRICOXIDE 39 10/24/2017    PFT: PFT Results Latest Ref Rng & Units 05/30/2016  FVC-Pre L 3.24  FVC-Predicted Pre % 82  FVC-Post L 3.37  FVC-Predicted Post % 85  Pre FEV1/FVC % % 64  Post FEV1/FCV % % 78  FEV1-Pre L 2.07  FEV1-Predicted Pre % 74  FEV1-Post L 2.61  DLCO UNC% % 61  DLCO COR %Predicted % 82  TLC L 5.83  TLC % Predicted % 85  RV % Predicted % 101    WALK:  SIX MIN WALK 07/31/2017  Supplimental Oxygen during Test? (L/min) No    Imaging: No results found.  Lab Results:  CBC    Component Value Date/Time   WBC 9.1 10/21/2017 0822   WBC 8.7 10/10/2017 1138   RBC 4.71 10/21/2017 0822   RBC 5.04 10/10/2017 1138   HGB 14.1 10/21/2017 0822   HCT 41.4 10/21/2017 0822   PLT 195 10/21/2017 0822   MCV 88 10/21/2017 0822   MCH 29.9 10/21/2017 0822   MCH 29.9 04/24/2017 0403   MCHC 34.1 10/21/2017 0822   MCHC 33.3 10/10/2017 1138   RDW 17.5 (H) 10/21/2017 0822   LYMPHSABS 1.9 10/21/2017 0822   MONOABS 0.9 10/10/2017 1138   EOSABS 0.4 10/21/2017 0822   BASOSABS 0.1 10/21/2017 0822    BMET    Component Value Date/Time  NA 137 10/21/2017 0822   K 4.9 10/21/2017 0822   CL 94 (L) 10/21/2017 0822   CO2 27 10/21/2017 0822   GLUCOSE 100 (H) 10/21/2017 0822   GLUCOSE 103 (H) 04/24/2017 0403   BUN 33 (H) 10/21/2017 0822   CREATININE 1.17 10/21/2017 0822   CREATININE 0.74 09/21/2015 1528   CALCIUM 9.6 10/21/2017 0822   GFRNONAA 59 (L) 10/21/2017 0822   GFRAA 68 10/21/2017 0822    BNP No results found for: BNP  ProBNP    Component Value Date/Time   PROBNP 2,182 (H) 07/01/2017 1011    Specialty  Problems      Pulmonary Problems   Multifocal pneumonia   Cough   Pleural effusion   Extrinsic asthma      No Known Allergies  Immunization History  Administered Date(s) Administered  . Fluad Quad(high Dose 65+) 10/15/2018  . Influenza, High Dose Seasonal PF 10/17/2016, 10/24/2017    Past Medical History:  Diagnosis Date  . Actinic keratoses    Dr Glee Arvin  . Allergic rhinitis   . Aortic stenosis    Bicuspid AV with severe AS s/p TAVR 06/2016  . Arthritis of left hip 11/03/2014  . Benign essential HTN   . Bilateral cataracts   . 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 BPH (benign prostatic hypertrophy)   . Chronic diastolic CHF (congestive heart failure) ([place]) 10/16/2017  . Coronary artery disease involving native coronary artery of native heart without angina pectoris    PCI 06/06/16 LAD proximal 60 (FFR 0.85-negative); RI 50; LCx calcified without significant stenosis; RCA mid 80 (FFR 0.68-hemodynamically significant); PCI: 2.25 x 16 mm Synergy DES to the mid RCA  . Dilated aortic root ([place])    14mm by echo 08/2015  . Glaucoma   . H/O seasonal allergies   . Hx of adenomatous colonic polyps 1991 on  . Hyperlipidemia   . Mitral valve prolapse    mild MR by echo 08/2015  . Multifocal pneumonia 12/01/2016  . OA (osteoarthritis) of hip 10/16/2016  . PAF (paroxysmal atrial fibrillation) ([place])    S/P DCCV 10/2017  . Paresthesia    Chronic left lateral thigh  . Pericardial effusion 11/05/2016  . Postural dizziness with presyncope 04/23/2017  . Pulmonary hypertension, moderate to severe (HCC)    moderate by right heart cath 02/2017  . Pulmonary nodules     Tobacco History: Social History   Tobacco Use  Smoking Status Former Smoker  . Packs/day: 1.00  . Years: 1.00  . Pack years: 1.00  . Types: Cigarettes  . Quit date: 07/14/1960  . Years since quitting: 58.4  Smokeless Tobacco Never Used   Counseling given: Yes  Continue to not smoke  Outpatient Encounter  Medications as of 12/29/2018  Medication Sig  . apixaban (ELIQUIS) 2.5 MG TABS tablet Take 1 tablet (2.5 mg total) by mouth 2 (two) times daily.  Marland Kitchen azelastine (OPTIVAR) 0.05 % ophthalmic solution Place 1 drop into both eyes 2 (two) times daily.  . feeding supplement, ENSURE ENLIVE, (ENSURE ENLIVE) LIQD Take 237 mLs by mouth daily.  . Feeding Supplies MISC by Does not apply route.  . fluticasone (FLONASE) 50 MCG/ACT nasal spray Place 1 spray into both nostrils at bedtime as needed for allergies.   . fluticasone furoate-vilanterol (BREO ELLIPTA) 200-25 MCG/INH AEPB Inhale 1 puff into the lungs daily.  Marland Kitchen latanoprost (XALATAN) 0.005 % ophthalmic solution Place 1 drop into both eyes at bedtime.  . montelukast (SINGULAIR) 10 MG tablet Take 1 tablet (  10 mg total) by mouth at bedtime.  . pravastatin (PRAVACHOL) 40 MG tablet Take 40 mg by mouth daily.  . [DISCONTINUED] amoxicillin-clavulanate (AUGMENTIN) 875-125 MG tablet Take 1 tablet by mouth 2 (two) times daily.  . [DISCONTINUED] predniSONE (DELTASONE) 10 MG tablet 4 tabs for 2 days, then 3 tabs for 2 days, 2 tabs for 2 days, then 1 tab for 2 days, then stop   No facility-administered encounter medications on file as of 12/29/2018.     Review of Systems  Review of Systems  Constitutional: Negative for activity change, chills, fatigue, fever and unexpected weight change.  HENT: Positive for postnasal drip and rhinorrhea. Negative for sinus pressure, sinus pain and sore throat.   Eyes: Negative.   Respiratory: Negative for cough, shortness of breath and wheezing.   Cardiovascular: Negative for chest pain and palpitations.  Gastrointestinal: Negative for constipation, diarrhea, nausea and vomiting.  Endocrine: Negative.   Genitourinary: Negative.   Musculoskeletal: Negative.   Skin: Negative.   Neurological: Negative for dizziness and headaches.  Psychiatric/Behavioral: Negative.  Negative for dysphoric mood. The patient is not  nervous/anxious.   All other systems reviewed and are negative.    Physical Exam  BP 114/72 (BP Location: Left Arm, Patient Position: Sitting, Cuff Size: Normal)   Pulse 85   Temp (!) 97.3 F (36.3 C) (Temporal)   Ht 5' 9.5" (1.765 m)   Wt 125 lb 6.4 oz (56.9 kg)   SpO2 97% Comment: On RA  BMI 18.25 kg/m   Wt Readings from Last 5 Encounters:  12/29/18 125 lb 6.4 oz (56.9 kg)  10/23/18 125 lb (56.7 kg)  09/17/18 118 lb 9.6 oz (53.8 kg)  03/16/18 128 lb (58.1 kg)  11/04/17 124 lb 12.8 oz (56.6 kg)    BMI Readings from Last 5 Encounters:  12/29/18 18.25 kg/m  10/23/18 18.19 kg/m  09/17/18 17.26 kg/m  03/16/18 18.63 kg/m  11/04/17 18.17 kg/m     Physical Exam Vitals and nursing note reviewed.  Constitutional:      General: He is not in acute distress.    Appearance: Normal appearance. He is normal weight.  HENT:     Head: Normocephalic and atraumatic.     Right Ear: Hearing, tympanic membrane, ear canal and external ear normal. There is no impacted cerumen.     Left Ear: Hearing, tympanic membrane, ear canal and external ear normal. There is no impacted cerumen.     Nose: Rhinorrhea present. No mucosal edema.     Right Turbinates: Not enlarged.     Left Turbinates: Not enlarged.     Mouth/Throat:     Mouth: Mucous membranes are dry.     Pharynx: Oropharynx is clear. No oropharyngeal exudate.     Comments: Post nasal drip  Eyes:     Pupils: Pupils are equal, round, and reactive to light.  Cardiovascular:     Rate and Rhythm: Normal rate and regular rhythm.     Pulses: Normal pulses.     Heart sounds: Normal heart sounds. No murmur.  Pulmonary:     Effort: Pulmonary effort is normal.     Breath sounds: Normal breath sounds. No decreased breath sounds, wheezing or rales.  Musculoskeletal:     Cervical back: Normal range of motion.     Right lower leg: No edema.     Left lower leg: No edema.  Lymphadenopathy:     Cervical: No cervical adenopathy.  Skin:     General: Skin is warm and  dry.     Capillary Refill: Capillary refill takes less than 2 seconds.     Findings: No erythema or rash.  Neurological:     General: No focal deficit present.     Mental Status: He is alert and oriented to person, place, and time.     Motor: No weakness.     Coordination: Coordination normal.     Gait: Gait is intact. Gait normal.  Psychiatric:        Mood and Affect: Mood normal.        Behavior: Behavior normal. Behavior is cooperative.        Thought Content: Thought content normal.        Judgment: Judgment normal.       Assessment & Plan:   Extrinsic asthma Plan: Continue Breo Ellipta inhaler Continue Singulair daily Continue daily antihistamine Start nasal saline rinses 1-2 times daily Continue Flonase 1 spray each nostril Continue follow-up with allergy asthma Follow-up with our office in 4 months   Elevated IgE level Plan: Continue Singulair Continued daily antihistamine Continue nasal saline rinses Continue to follow-up with allergy asthma  Eosinophilic leukocytosis Plan: Continue to follow-up with allergist Dr. Donneta Romberg  Multiple environmental allergies Plan: Continue Singulair Continue follow-up with Dr. Donneta Romberg Continue monthly allergy shots    Return in about 4 months (around 04/29/2019), or if symptoms worsen or fail to improve, for Follow up with Dr. Purnell Shoemaker.   Lauraine Rinne, NP 12/29/2018   This appointment was 32 minutes long with over 50% of the time in direct face-to-face patient care, assessment, plan of care, and follow-up.

## 2018-12-29 ENCOUNTER — Ambulatory Visit (INDEPENDENT_AMBULATORY_CARE_PROVIDER_SITE_OTHER): Payer: Medicare Other | Admitting: Pulmonary Disease

## 2018-12-29 ENCOUNTER — Other Ambulatory Visit: Payer: Self-pay

## 2018-12-29 ENCOUNTER — Encounter: Payer: Self-pay | Admitting: Pulmonary Disease

## 2018-12-29 VITALS — BP 114/72 | HR 85 | Temp 97.3°F | Ht 69.5 in | Wt 125.4 lb

## 2018-12-29 DIAGNOSIS — Z9109 Other allergy status, other than to drugs and biological substances: Secondary | ICD-10-CM

## 2018-12-29 DIAGNOSIS — D7219 Other eosinophilia: Secondary | ICD-10-CM | POA: Diagnosis not present

## 2018-12-29 DIAGNOSIS — R768 Other specified abnormal immunological findings in serum: Secondary | ICD-10-CM

## 2018-12-29 DIAGNOSIS — J4541 Moderate persistent asthma with (acute) exacerbation: Secondary | ICD-10-CM

## 2018-12-29 MED ORDER — BREO ELLIPTA 200-25 MCG/INH IN AEPB: 1.0000 | INHALATION_SPRAY | Freq: Every day | RESPIRATORY_TRACT | 5 refills | Status: DC

## 2018-12-29 MED ORDER — MONTELUKAST SODIUM 10 MG PO TABS: 10.0000 mg | ORAL_TABLET | Freq: Every day | ORAL | 3 refills | Status: DC

## 2018-12-29 NOTE — Assessment & Plan Note (Signed)
Plan: Continue Singulair Continued daily antihistamine Continue nasal saline rinses Continue to follow-up with allergy asthma

## 2018-12-29 NOTE — Assessment & Plan Note (Signed)
Plan: Continue to follow-up with allergist Dr. Donneta Romberg

## 2018-12-29 NOTE — Assessment & Plan Note (Signed)
Plan: Continue Singulair Continue follow-up with Dr. Donneta Romberg Continue monthly allergy shots

## 2018-12-29 NOTE — Assessment & Plan Note (Signed)
Plan: Continue Breo Ellipta inhaler Continue Singulair daily Continue daily antihistamine Start nasal saline rinses 1-2 times daily Continue Flonase 1 spray each nostril Continue follow-up with allergy asthma Follow-up with our office in 4 months

## 2018-12-29 NOTE — Patient Instructions (Addendum)
You were seen today by Lauraine Rinne, NP  for:   1. Moderate persistent extrinsic asthma   Breo Ellipta 200 >>> Take 1 puff daily in the morning right when you wake up >>>Rinse your mouth out after use >>>This is a daily maintenance inhaler, NOT a rescue inhaler >>>Contact our office if you are having difficulties affording or obtaining this medication >>>It is important for you to be able to take this daily and not miss any doses    Only use your albuterol as a rescue medication to be used if you can't catch your breath by resting or doing a relaxed purse lip breathing pattern.  - The less you use it, the better it will work when you need it. - Ok to use up to 2 puffs  every 4 hours if you must but call for immediate appointment if use goes up over your usual need - Don't leave home without it !!  (think of it like the spare tire for your car)   Continue Singulair daily  Can do nasal saline rinses twice daily  Keep follow-up with allergist  2. Elevated IgE level  Continue singular 10 mg tablet daily  3. Eosinophilic leukocytosis, unspecified type  Continue follow-up with allergist Dr. Donneta Romberg  4. Multiple environmental allergies  Continue follow-up with allergy Continue singular 10 mg daily  Contact our office if you have worsened breathing   Follow Up:    Return in about 4 months (around 04/29/2019), or if symptoms worsen or fail to improve, for Follow up with Dr. Purnell Shoemaker.   Please do your part to reduce the spread of COVID-19:      Reduce your risk of any infection  and COVID19 by using the similar precautions used for avoiding the common cold or flu:  Marland Kitchen Wash your hands often with soap and warm water for at least 20 seconds.  If soap and water are not readily available, use an alcohol-based hand sanitizer with at least 60% alcohol.  . If coughing or sneezing, cover your mouth and nose by coughing or sneezing into the elbow areas of your shirt or coat, into a  tissue or into your sleeve (not your hands). Langley Gauss A MASK when in public  . Avoid shaking hands with others and consider head nods or verbal greetings only. . Avoid touching your eyes, nose, or mouth with unwashed hands.  . Avoid close contact with people who are sick. . Avoid places or events with large numbers of people in one location, like concerts or sporting events. . If you have some symptoms but not all symptoms, continue to monitor at home and seek medical attention if your symptoms worsen. . If you are having a medical emergency, call 911.   [place] / e-Visit: [email or web address]         [facility]: [facility]: [id number]                   [facility]: [id number]     It is flu season:   >>> Best ways to protect herself from the flu: Receive the yearly flu vaccine, practice good hand hygiene washing with soap and also using hand sanitizer when available, eat a nutritious meals, get adequate rest, hydrate appropriately   Please contact the office if your symptoms worsen or you have concerns that you are not improving.   Thank you for choosing  [facility] for your healthcare, and for allowing [place] to partner with you on your healthcare journey. I am thankful to be able to provide care to you today.   Wyn Quaker FNP-C

## 2019-01-27 DIAGNOSIS — J301 Allergic rhinitis due to pollen: Secondary | ICD-10-CM | POA: Diagnosis not present

## 2019-01-27 DIAGNOSIS — J3089 Other allergic rhinitis: Secondary | ICD-10-CM | POA: Diagnosis not present

## 2019-01-27 DIAGNOSIS — R05 Cough: Secondary | ICD-10-CM | POA: Diagnosis not present

## 2019-01-27 DIAGNOSIS — J3081 Allergic rhinitis due to animal (cat) (dog) hair and dander: Secondary | ICD-10-CM | POA: Diagnosis not present

## 2019-01-29 DIAGNOSIS — B078 Other viral warts: Secondary | ICD-10-CM | POA: Diagnosis not present

## 2019-01-29 DIAGNOSIS — X32XXXD Exposure to sunlight, subsequent encounter: Secondary | ICD-10-CM | POA: Diagnosis not present

## 2019-01-29 DIAGNOSIS — L570 Actinic keratosis: Secondary | ICD-10-CM | POA: Diagnosis not present

## 2019-01-29 DIAGNOSIS — L84 Corns and callosities: Secondary | ICD-10-CM | POA: Diagnosis not present

## 2019-01-29 DIAGNOSIS — Z1283 Encounter for screening for malignant neoplasm of skin: Secondary | ICD-10-CM | POA: Diagnosis not present

## 2019-02-02 DIAGNOSIS — J309 Allergic rhinitis, unspecified: Secondary | ICD-10-CM | POA: Diagnosis not present

## 2019-02-02 DIAGNOSIS — M1612 Unilateral primary osteoarthritis, left hip: Secondary | ICD-10-CM | POA: Diagnosis not present

## 2019-02-02 DIAGNOSIS — E7800 Pure hypercholesterolemia, unspecified: Secondary | ICD-10-CM | POA: Diagnosis not present

## 2019-02-02 DIAGNOSIS — Z1389 Encounter for screening for other disorder: Secondary | ICD-10-CM | POA: Diagnosis not present

## 2019-02-02 DIAGNOSIS — R972 Elevated prostate specific antigen [PSA]: Secondary | ICD-10-CM | POA: Diagnosis not present

## 2019-02-02 DIAGNOSIS — E440 Moderate protein-calorie malnutrition: Secondary | ICD-10-CM | POA: Diagnosis not present

## 2019-02-02 DIAGNOSIS — Q231 Congenital insufficiency of aortic valve: Secondary | ICD-10-CM | POA: Diagnosis not present

## 2019-02-02 DIAGNOSIS — I10 Essential (primary) hypertension: Secondary | ICD-10-CM | POA: Diagnosis not present

## 2019-02-02 DIAGNOSIS — N400 Enlarged prostate without lower urinary tract symptoms: Secondary | ICD-10-CM | POA: Diagnosis not present

## 2019-02-02 DIAGNOSIS — Z0000 Encounter for general adult medical examination without abnormal findings: Secondary | ICD-10-CM | POA: Diagnosis not present

## 2019-02-02 DIAGNOSIS — K863 Pseudocyst of pancreas: Secondary | ICD-10-CM | POA: Diagnosis not present

## 2019-02-02 DIAGNOSIS — I350 Nonrheumatic aortic (valve) stenosis: Secondary | ICD-10-CM | POA: Diagnosis not present

## 2019-02-09 ENCOUNTER — Ambulatory Visit: Payer: Medicare Other

## 2019-02-12 DIAGNOSIS — H18513 Endothelial corneal dystrophy, bilateral: Secondary | ICD-10-CM | POA: Diagnosis not present

## 2019-02-12 DIAGNOSIS — H26492 Other secondary cataract, left eye: Secondary | ICD-10-CM | POA: Diagnosis not present

## 2019-02-12 DIAGNOSIS — H401123 Primary open-angle glaucoma, left eye, severe stage: Secondary | ICD-10-CM | POA: Diagnosis not present

## 2019-02-12 DIAGNOSIS — H401112 Primary open-angle glaucoma, right eye, moderate stage: Secondary | ICD-10-CM | POA: Diagnosis not present

## 2019-02-15 DIAGNOSIS — J3081 Allergic rhinitis due to animal (cat) (dog) hair and dander: Secondary | ICD-10-CM | POA: Diagnosis not present

## 2019-02-15 DIAGNOSIS — N401 Enlarged prostate with lower urinary tract symptoms: Secondary | ICD-10-CM | POA: Diagnosis not present

## 2019-02-15 DIAGNOSIS — J3089 Other allergic rhinitis: Secondary | ICD-10-CM | POA: Diagnosis not present

## 2019-02-15 DIAGNOSIS — J301 Allergic rhinitis due to pollen: Secondary | ICD-10-CM | POA: Diagnosis not present

## 2019-02-15 DIAGNOSIS — N138 Other obstructive and reflux uropathy: Secondary | ICD-10-CM | POA: Diagnosis not present

## 2019-02-18 ENCOUNTER — Ambulatory Visit: Payer: Medicare Other | Attending: Internal Medicine

## 2019-02-18 DIAGNOSIS — Z23 Encounter for immunization: Secondary | ICD-10-CM | POA: Insufficient documentation

## 2019-02-18 NOTE — Progress Notes (Signed)
   Covid-19 Vaccination Clinic  Name:  [name] [name]    MRN: [id number] DOB: [birth year]  02/18/2019  [name] was observed post Covid-19 immunization for 15 minutes without incidence. He was provided with Vaccine Information Sheet and instruction to access the V-Safe system.   [name] was instructed to call 911 with any severe reactions post vaccine: Marland Kitchen Difficulty breathing  . Swelling of your face and throat  . A fast heartbeat  . A bad rash all over your body  . Dizziness and weakness    Immunizations Administered    Name Date Dose VIS Date Route   Pfizer COVID-19 Vaccine 02/18/2019  5:31 PM 0.3 mL 12/25/2018 Intramuscular   Manufacturer: [place]   Lot: [id number]   [place]: [id number]

## 2019-02-20 ENCOUNTER — Ambulatory Visit: Payer: Medicare Other

## 2019-03-01 DIAGNOSIS — J3089 Other allergic rhinitis: Secondary | ICD-10-CM | POA: Diagnosis not present

## 2019-03-01 DIAGNOSIS — J3081 Allergic rhinitis due to animal (cat) (dog) hair and dander: Secondary | ICD-10-CM | POA: Diagnosis not present

## 2019-03-01 DIAGNOSIS — J301 Allergic rhinitis due to pollen: Secondary | ICD-10-CM | POA: Diagnosis not present

## 2019-03-03 DIAGNOSIS — J301 Allergic rhinitis due to pollen: Secondary | ICD-10-CM | POA: Diagnosis not present

## 2019-03-03 DIAGNOSIS — J3089 Other allergic rhinitis: Secondary | ICD-10-CM | POA: Diagnosis not present

## 2019-03-03 DIAGNOSIS — J3081 Allergic rhinitis due to animal (cat) (dog) hair and dander: Secondary | ICD-10-CM | POA: Diagnosis not present

## 2019-03-08 DIAGNOSIS — J301 Allergic rhinitis due to pollen: Secondary | ICD-10-CM | POA: Diagnosis not present

## 2019-03-08 DIAGNOSIS — J3081 Allergic rhinitis due to animal (cat) (dog) hair and dander: Secondary | ICD-10-CM | POA: Diagnosis not present

## 2019-03-08 DIAGNOSIS — J3089 Other allergic rhinitis: Secondary | ICD-10-CM | POA: Diagnosis not present

## 2019-03-10 DIAGNOSIS — J301 Allergic rhinitis due to pollen: Secondary | ICD-10-CM | POA: Diagnosis not present

## 2019-03-10 DIAGNOSIS — J3081 Allergic rhinitis due to animal (cat) (dog) hair and dander: Secondary | ICD-10-CM | POA: Diagnosis not present

## 2019-03-10 DIAGNOSIS — H04123 Dry eye syndrome of bilateral lacrimal glands: Secondary | ICD-10-CM | POA: Diagnosis not present

## 2019-03-10 DIAGNOSIS — H401123 Primary open-angle glaucoma, left eye, severe stage: Secondary | ICD-10-CM | POA: Diagnosis not present

## 2019-03-10 DIAGNOSIS — J3089 Other allergic rhinitis: Secondary | ICD-10-CM | POA: Diagnosis not present

## 2019-03-10 DIAGNOSIS — H18513 Endothelial corneal dystrophy, bilateral: Secondary | ICD-10-CM | POA: Diagnosis not present

## 2019-03-15 DIAGNOSIS — J3081 Allergic rhinitis due to animal (cat) (dog) hair and dander: Secondary | ICD-10-CM | POA: Diagnosis not present

## 2019-03-15 DIAGNOSIS — J301 Allergic rhinitis due to pollen: Secondary | ICD-10-CM | POA: Diagnosis not present

## 2019-03-15 DIAGNOSIS — J3089 Other allergic rhinitis: Secondary | ICD-10-CM | POA: Diagnosis not present

## 2019-03-16 ENCOUNTER — Ambulatory Visit: Payer: Medicare Other | Attending: Internal Medicine

## 2019-03-16 DIAGNOSIS — Z23 Encounter for immunization: Secondary | ICD-10-CM | POA: Insufficient documentation

## 2019-03-16 NOTE — Progress Notes (Signed)
   Covid-19 Vaccination Clinic  Name:  [name] [name]    MRN: [id number] DOB: 08-03-1935  03/16/2019  Mr. [name] was observed post Covid-19 immunization for 15 minutes without incident. He was provided with Vaccine Information Sheet and instruction to access the V-Safe system.   Mr. [name] was instructed to call 911 with any severe reactions post vaccine: Marland Kitchen Difficulty breathing  . Swelling of face and throat  . A fast heartbeat  . A bad rash all over body  . Dizziness and weakness   Immunizations Administered    Name Date Dose VIS Date Route   Pfizer COVID-19 Vaccine 03/16/2019 10:26 AM 0.3 mL 12/25/2018 Intramuscular   Manufacturer: [place]   Lot: [id number]   [name]: [id number]

## 2019-04-05 DIAGNOSIS — H8110 Benign paroxysmal vertigo, unspecified ear: Secondary | ICD-10-CM | POA: Diagnosis not present

## 2019-04-05 DIAGNOSIS — I10 Essential (primary) hypertension: Secondary | ICD-10-CM | POA: Diagnosis not present

## 2019-04-05 DIAGNOSIS — E860 Dehydration: Secondary | ICD-10-CM | POA: Diagnosis not present

## 2019-04-05 DIAGNOSIS — E7800 Pure hypercholesterolemia, unspecified: Secondary | ICD-10-CM | POA: Diagnosis not present

## 2019-04-06 DIAGNOSIS — J3081 Allergic rhinitis due to animal (cat) (dog) hair and dander: Secondary | ICD-10-CM | POA: Diagnosis not present

## 2019-04-06 DIAGNOSIS — J301 Allergic rhinitis due to pollen: Secondary | ICD-10-CM | POA: Diagnosis not present

## 2019-04-06 DIAGNOSIS — J3089 Other allergic rhinitis: Secondary | ICD-10-CM | POA: Diagnosis not present

## 2019-04-13 NOTE — Progress Notes (Signed)
Cardiology Office Note    Date:  04/21/2019   ID:  [name] [name], DOB December 11, 1935, MRN [id number]  PCP:  Maury Dus, MD  Cardiologist: Fransico Him, MD EPS: None  Chief Complaint  Patient presents with  . Follow-up    History of Present Illness:  [name] [name] is a 84 y.o. male with a hx of HLD, bicuspid aortic valve with severe AS s/p TAVR 06/2016. LHC at time of TAVR showed non-obstructive CAD in the LAD and ramus with hemodynamically significant RCA stenosis by FFR s/p PCI of the RCA with DES on 06/06/16. He has a hx of chronic pericardial effusion with extensive workup and unknown etiology.  Last echo showed complete resolution of effusion. He also has PAF and is on Eliquis for CHADS2VASC score of 4.    Due to symptoms of right heart failure with increased abdominal fullness, weight loss and lower extremity edema and underwent a R/LHC secondary to concern for constrictive pericarditis. R/L cath completed on 05/29/17 which showed elevated filling pressures suggestive of diastolic CHF, moderate pulmonary hypertension, no evidence of constrictive pericarditis, minimal pressure gradient over prosthetic valve and patent RCA stent. He was noted to be in atrial flutter with rate control and plans were made for DCCV after adequate AC. Pt underwent successful DCCV on 10/27/17.     Patient last saw Dr. Radford Pax 09/17/2018 at which time his heart rate was in the low 40s and he was having dizziness.  She recommended stopping his timolol eyedrops and following up with his ophthalmologist.  She also ordered a Zio patch and he was found to have atrial flutter and referred to Dr. Rayann Heman. Patient saw Dr. Rayann Heman 10/23/2018 and discussed possible ablation but the patient declined.  He preferred conservative approach.  In the history of bradycardia and the fact that he was asymptomatic he did not recommend increasing rate controlled medications or AAD therapy. Saw Oda Kilts 04/16/19 and no changes  made.  Patient comes in for f/u. He feels "tottery"- doesn't feel comfortable walking alone. Feels like he sways when he walks.Has been going on for a couple of years. Denies feeling dizzy, off balance, chest pain, shortness of breath. Was able to work in his yard yesterday. Recently had vertigo and was given Epley maneuvers to try but patient hasn't had any further vertigo. Has received covid19 vaccine.  Past Medical History:  Diagnosis Date  . Actinic keratoses    Dr Glee Arvin  . Allergic rhinitis   . Aortic stenosis    Bicuspid AV with severe AS s/p TAVR 06/2016  . Arthritis of left hip 11/03/2014  . Benign essential HTN   . Bilateral cataracts   . 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 BPH (benign prostatic hypertrophy)   . Chronic diastolic CHF (congestive heart failure) ([place]) 10/16/2017  . Coronary artery disease involving native coronary artery of native heart without angina pectoris    PCI 06/06/16 LAD proximal 60 (FFR 0.85-negative); RI 50; LCx calcified without significant stenosis; RCA mid 80 (FFR 0.68-hemodynamically significant); PCI: 2.25 x 16 mm Synergy DES to the mid RCA  . Dilated aortic root ([place])    109mm by echo 08/2015  . Glaucoma   . H/O seasonal allergies   . Hx of adenomatous colonic polyps 1991 on  . Hyperlipidemia   . Mitral valve prolapse    mild MR by echo 08/2015  . Multifocal pneumonia 12/01/2016  . OA (osteoarthritis) of hip 10/16/2016  . PAF (paroxysmal atrial fibrillation) ([place])  S/P DCCV 10/2017  . Paresthesia    Chronic left lateral thigh  . Pericardial effusion 11/05/2016  . Postural dizziness with presyncope 04/23/2017  . Pulmonary hypertension, moderate to severe (HCC)    moderate by right heart cath 02/2017  . Pulmonary nodules     Past Surgical History:  Procedure Laterality Date  . CARDIOVERSION N/A 10/27/2017   Procedure: CARDIOVERSION;  Surgeon: Josue Hector, MD;  Location: [facility] ENDOSCOPY;  Service: Cardiovascular;  Laterality: N/A;  .  COLONOSCOPY  multiple  . CORONARY STENT INTERVENTION N/A 06/06/2016   Procedure: Coronary Stent Intervention;  Surgeon: [name], MD;  Location: [place] CV LAB;  Service: Cardiovascular;  Laterality: N/A;  . CYSTOSCOPY  08/12/2016   per patient; showed a cyst on bladder , liver , and kidney , sees urologist Matilde Bash [facility] at [facility]  . EYE SURGERY     Bilateral cataract removal  . INTRAVASCULAR PRESSURE WIRE/FFR STUDY N/A 05/29/2016   Procedure: Intravascular Pressure Wire/FFR Study;  Surgeon: [name], MD;  Location: [place] CV LAB;  Service: Cardiovascular;  Laterality: N/A;  . IR THORACENTESIS ASP PLEURAL SPACE W/IMG GUIDE  05/28/2017  . RIGHT/LEFT HEART CATH AND CORONARY ANGIOGRAPHY N/A 05/29/2016   Procedure: Right/Left Heart Cath and Coronary Angiography;  Surgeon: [name], MD;  Location: [place] CV LAB;  Service: Cardiovascular;  Laterality: N/A;  . RIGHT/LEFT HEART CATH AND CORONARY ANGIOGRAPHY N/A 06/06/2017   Procedure: RIGHT/LEFT HEART CATH AND CORONARY ANGIOGRAPHY;  Surgeon: Larey Dresser, MD;  Location: [place] CV LAB;  Service: Cardiovascular;  Laterality: N/A;  . TEE WITHOUT CARDIOVERSION N/A 07/02/2016   Procedure: TRANSESOPHAGEAL ECHOCARDIOGRAM (TEE);  Surgeon: [name], MD;  Location: [place];  Service: Open Heart Surgery;  Laterality: N/A;  . TONSILLECTOMY  1949  . TOTAL HIP ARTHROPLASTY Left 10/16/2016   Procedure: LEFT TOTAL HIP ARTHROPLASTY ANTERIOR APPROACH;  Surgeon: Gaynelle Arabian, MD;  [facility]: [facility];  Service: Orthopedics;  Laterality: Left;  . TRANSCATHETER AORTIC VALVE REPLACEMENT, TRANSFEMORAL N/A 07/02/2016   Procedure: TRANSCATHETER AORTIC VALVE REPLACEMENT, TRANSFEMORAL;  Surgeon: [name], MD;  Location: [place];  Service: Open Heart Surgery;  Laterality: N/A;    Current Medications: Current Meds  Medication Sig  . azelastine (OPTIVAR) 0.05 % ophthalmic solution Place 1 drop into both eyes 2 (two) times daily.    Marland Kitchen ELIQUIS 2.5 MG TABS tablet TAKE 1 TABLET BY MOUTH TWICE DAILY.  . feeding supplement, ENSURE ENLIVE, (ENSURE ENLIVE) LIQD Take 237 mLs by mouth daily.  . Feeding Supplies MISC by Does not apply route.  . fluticasone (FLONASE) 50 MCG/ACT nasal spray Place 1 spray into both nostrils at bedtime as needed for allergies.   . fluticasone furoate-vilanterol (BREO ELLIPTA) 200-25 MCG/INH AEPB Inhale 1 puff into the lungs daily.  Marland Kitchen latanoprost (XALATAN) 0.005 % ophthalmic solution Place 1 drop into both eyes at bedtime.  . montelukast (SINGULAIR) 10 MG tablet Take 1 tablet (10 mg total) by mouth at bedtime.  . pravastatin (PRAVACHOL) 40 MG tablet Take 40 mg by mouth daily.     Allergies:   Patient has no known allergies.   Social History   Socioeconomic History  . Marital status: Widowed    Spouse name: Not on file  . Number of children: Not on file  . Years of education: Not on file  . Highest education level: Not on file  Occupational History  . Not on file  Tobacco Use  . Smoking status: Former Smoker  Packs/day: 1.00    Years: 1.00    Pack years: 1.00    Types: Cigarettes    Quit date: 07/14/1960    Years since quitting: 58.8  . Smokeless tobacco: Never Used  Substance and Sexual Activity  . Alcohol use: No  . Drug use: No  . Sexual activity: Not on file  Other Topics Concern  . Not on file  Social History Narrative  . Not on file   Social Determinants of Health   Financial Resource Strain:   . Difficulty of Paying Living Expenses:   Food Insecurity:   . Worried About Charity fundraiser in the Last Year:   . Arboriculturist in the Last Year:   Transportation Needs:   . Film/video editor (Medical):   Marland Kitchen Lack of Transportation (Non-Medical):   Physical Activity:   . Days of Exercise per Week:   . Minutes of Exercise per Session:   Stress:   . Feeling of Stress :   Social Connections:   . Frequency of Communication with Friends and Family:   . Frequency of  Social Gatherings with Friends and Family:   . Attends Religious Services:   . Active Member of Clubs or Organizations:   . Attends Archivist Meetings:   Marland Kitchen Marital Status:      Family History:  The patient's family history includes Emphysema in his mother; Heart attack in his brother; Heart disease in his brother; Pancreatic cancer in his father.   ROS:   Please see the history of present illness.    ROS All other systems reviewed and are negative.   PHYSICAL EXAM:   VS:  BP (!) 106/56   Pulse 79   Ht 5' 9.5" (1.765 m)   Wt 129 lb 12.8 oz (58.9 kg)   SpO2 97%   BMI 18.89 kg/m   Physical Exam  GEN: Thin, elderly, in no acute distress  Neck: no JVD, carotid bruits, or masses Cardiac:RRR; no murmurs, rubs, or gallops  Respiratory:  clear to auscultation bilaterally, normal work of breathing GI: soft, nontender, nondistended, + BS Ext: without cyanosis, clubbing, or edema, Good distal pulses bilaterally Neuro:  Alert and Oriented x 3 Psych: euthymic mood, full affect  Wt Readings from Last 3 Encounters:  04/21/19 129 lb 12.8 oz (58.9 kg)  04/16/19 112 lb 6.4 oz (51 kg)  12/29/18 125 lb 6.4 oz (56.9 kg)      Studies/Labs Reviewed:   EKG:  EKG is not ordered today.    Recent Labs: 05/05/2018: ALT 36   Lipid Panel    Component Value Date/Time   CHOL 147 05/05/2018 1035   TRIG 83 05/05/2018 1035   HDL 56 05/05/2018 1035   CHOLHDL 2.6 05/05/2018 1035   LDLCALC 74 05/05/2018 1035    Additional studies/ records that were reviewed today include:    Zio monitor 09/3018  Sinus bradycardia, normal sinus rhythm and sinus tachycardia. The average heart rate was 69bbpm and ranged from 48 to 117bpm.  Frequent PVCs, bigeminal PVCs and nonsustained ventricular tachycardia up to 8 beats  SVT up to 176bpm with possible atrial flutter         ASSESSMENT:    1. S/P TAVR (transcatheter aortic valve replacement)   2. Benign essential HTN   3. Coronary artery  disease involving native coronary artery of native heart without angina pectoris   4. Pleural effusion   5. PAF (paroxysmal atrial fibrillation) ([place])   6. Atrial  flutter, unspecified type ([place])   7. Chronic diastolic CHF (congestive heart failure) ([place])   8. Pure hypercholesterolemia   9. Balance problem      PLAN:  In order of problems listed above:  1.  Bicuspid AV with severe AS - s/p TAVR in 2018. No ASA due to DOAC.   2.  HTN - BP on the low side. He is not on any antihypertensive meds at this time.    3.  ASCAD - non-obstructive CAD in the LAD and ramus with hemodynamically significant RCA stenosis by FFR s/p PCI of the RCA with DES on 06/06/16.  No anginal sx.  Continue statin.  No ASA due to DOAC.   4.  Pericardial effusion - completely resolved on echo 09/2018   5.  PAF - he is s/p DCCV on Apixaban 2.5mg  BID (dosed for age >83 and weight < 60kg).  recent cbc and bmet was normal  6. Atrial flutter-saw Dr. Rayann Heman and patient prefers not to pursue ablation as he is asymptomatic. History of bradycardia so meds weren't adjusted or added.   7.  Chronic diastolic CHF -compensated   8.  HLD - LDL near goal at 74 in April 2020.  Continue Pravastatin.    9. Balance issues-feeling unsteady when he walks. Will refer to neurology.      Medication Adjustments/Labs and Tests Ordered: Current medicines are reviewed at length with the patient today.  Concerns regarding medicines are outlined above.  Medication changes, Labs and Tests ordered today are listed in the Patient Instructions below. Patient Instructions  Medication Instructions:  Your physician recommends that you continue on your current medications as directed. Please refer to the Current Medication list given to you today.  *If you need a refill on your cardiac medications before your next appointment, please call your pharmacy*   Lab Work: None ordered  If you have labs (blood work) drawn today and your tests are  completely normal, you will receive your results only by: Marland Kitchen MyChart Message (if you have MyChart) OR . A paper copy in the mail If you have any lab test that is abnormal or we need to change your treatment, we will call you to review the results.   Testing/Procedures: None ordered   Follow-Up: You have been referred to Neurology   At [facility], you and your health needs are our priority.  As part of our continuing mission to provide you with exceptional heart care, we have created designated Provider Care Teams.  These Care Teams include your primary Cardiologist (physician) and Advanced Practice Providers (APPs -  Physician Assistants and Nurse Practitioners) who all work together to provide you with the care you need, when you need it.  We recommend signing up for the patient portal called "MyChart".  Sign up information is provided on this After Visit Summary.  MyChart is used to connect with patients for Virtual Visits (Telemedicine).  Patients are able to view lab/test results, encounter notes, upcoming appointments, etc.  Non-urgent messages can be sent to your provider as well.   To learn more about what you can do with MyChart, go to [email or web address].    Your next appointment:   6 month(s)  The format for your next appointment:   In Person  Provider:   You may see Fransico Him, MD or one of the following Advanced Practice Providers on your designated Care Team:    Melina Copa, PA-C  Ermalinda Barrios, PA-C    Other Instructions  [name] Boast, PA-C  04/21/2019 10:13 AM    [facility] [address], [place], [place]  [address] Phone: [phone]; Fax: [phone]

## 2019-04-15 ENCOUNTER — Other Ambulatory Visit: Payer: Self-pay | Admitting: Cardiology

## 2019-04-15 DIAGNOSIS — J3081 Allergic rhinitis due to animal (cat) (dog) hair and dander: Secondary | ICD-10-CM | POA: Diagnosis not present

## 2019-04-15 DIAGNOSIS — J301 Allergic rhinitis due to pollen: Secondary | ICD-10-CM | POA: Diagnosis not present

## 2019-04-15 DIAGNOSIS — J3089 Other allergic rhinitis: Secondary | ICD-10-CM | POA: Diagnosis not present

## 2019-04-15 NOTE — Telephone Encounter (Signed)
Pt last saw Dr Rayann Heman 10/23/18 video visit Covid-19, last labs 04/05/19 Creat 0.9 at [facility] per [place], age 84, weight 56.9kg, based on specified criteria pt is on appropriate dosage of Eliquis 2.5mg  BID.  Will refill rx.

## 2019-04-15 NOTE — Progress Notes (Signed)
PCP:  Maury Dus, MD Primary Cardiologist: Fransico Him, MD Electrophysiologist: Dr. Worthy Flank is a 84 y.o. male with past medical history of atrial flutter, sinus bradycardia, and NSVT with normal EF who presents today for routine electrophysiology followup. They are seen for Dr. Rayann Heman.   Since last being seen in our clinic, the patient reports doing very well.  At last visit they discussed his paroxysmal Atrial flutter and ablation, pt preferred conservative therapy, which at this time he still prefers. He denies symptoms of palpitations, chest pain, shortness of breath, orthopnea, PND, lower extremity edema, claudication, dizziness, presyncope, syncope, bleeding, or neurologic sequela. The patient is tolerating medications without difficulties.  He does have easy bruising on Eliquis.   Past Medical History:  Diagnosis Date  . Actinic keratoses    Dr Glee Arvin  . Allergic rhinitis   . Aortic stenosis    Bicuspid AV with severe AS s/p TAVR 06/2016  . Arthritis of left hip 11/03/2014  . Benign essential HTN   . Bilateral cataracts   . 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 BPH (benign prostatic hypertrophy)   . Chronic diastolic CHF (congestive heart failure) ([place]) 10/16/2017  . Coronary artery disease involving native coronary artery of native heart without angina pectoris    PCI 06/06/16 LAD proximal 60 (FFR 0.85-negative); RI 50; LCx calcified without significant stenosis; RCA mid 80 (FFR 0.68-hemodynamically significant); PCI: 2.25 x 16 mm Synergy DES to the mid RCA  . Dilated aortic root ([place])    54mm by echo 08/2015  . Glaucoma   . H/O seasonal allergies   . Hx of adenomatous colonic polyps 1991 on  . Hyperlipidemia   . Mitral valve prolapse    mild MR by echo 08/2015  . Multifocal pneumonia 12/01/2016  . OA (osteoarthritis) of hip 10/16/2016  . PAF (paroxysmal atrial fibrillation) ([place])    S/P DCCV 10/2017  . Paresthesia    Chronic left lateral thigh  .  Pericardial effusion 11/05/2016  . Postural dizziness with presyncope 04/23/2017  . Pulmonary hypertension, moderate to severe (HCC)    moderate by right heart cath 02/2017  . Pulmonary nodules    Past Surgical History:  Procedure Laterality Date  . CARDIOVERSION N/A 10/27/2017   Procedure: CARDIOVERSION;  Surgeon: Josue Hector, MD;  Location: [facility] ENDOSCOPY;  Service: Cardiovascular;  Laterality: N/A;  . COLONOSCOPY  multiple  . CORONARY STENT INTERVENTION N/A 06/06/2016   Procedure: Coronary Stent Intervention;  Surgeon: [name], MD;  Location: [place] CV LAB;  Service: Cardiovascular;  Laterality: N/A;  . CYSTOSCOPY  08/12/2016   per patient; showed a cyst on bladder , liver , and kidney , sees urologist Matilde Bash III at [facility]  . EYE SURGERY     Bilateral cataract removal  . INTRAVASCULAR PRESSURE WIRE/FFR STUDY N/A 05/29/2016   Procedure: Intravascular Pressure Wire/FFR Study;  Surgeon: [name], MD;  Location: [place] CV LAB;  Service: Cardiovascular;  Laterality: N/A;  . IR THORACENTESIS ASP PLEURAL SPACE W/IMG GUIDE  05/28/2017  . RIGHT/LEFT HEART CATH AND CORONARY ANGIOGRAPHY N/A 05/29/2016   Procedure: Right/Left Heart Cath and Coronary Angiography;  Surgeon: [name], MD;  Location: [place] CV LAB;  Service: Cardiovascular;  Laterality: N/A;  . RIGHT/LEFT HEART CATH AND CORONARY ANGIOGRAPHY N/A 06/06/2017   Procedure: RIGHT/LEFT HEART CATH AND CORONARY ANGIOGRAPHY;  Surgeon: Larey Dresser, MD;  Location: [place] CV LAB;  Service: Cardiovascular;  Laterality: N/A;  . TEE WITHOUT  CARDIOVERSION N/A 07/02/2016   Procedure: TRANSESOPHAGEAL ECHOCARDIOGRAM (TEE);  Surgeon: [name], MD;  Location: [place];  Service: Open Heart Surgery;  Laterality: N/A;  . TONSILLECTOMY  1949  . TOTAL HIP ARTHROPLASTY Left 10/16/2016   Procedure: LEFT TOTAL HIP ARTHROPLASTY ANTERIOR APPROACH;  Surgeon: Gaynelle Arabian, MD;  Location: [facility];  Service:  Orthopedics;  Laterality: Left;  . TRANSCATHETER AORTIC VALVE REPLACEMENT, TRANSFEMORAL N/A 07/02/2016   Procedure: TRANSCATHETER AORTIC VALVE REPLACEMENT, TRANSFEMORAL;  Surgeon: [name], MD;  Location: [place];  Service: Open Heart Surgery;  Laterality: N/A;    Current Outpatient Medications  Medication Sig Dispense Refill  . azelastine (OPTIVAR) 0.05 % ophthalmic solution Place 1 drop into both eyes 2 (two) times daily.    Marland Kitchen ELIQUIS 2.5 MG TABS tablet TAKE 1 TABLET BY MOUTH TWICE DAILY. 180 tablet 1  . feeding supplement, ENSURE ENLIVE, (ENSURE ENLIVE) LIQD Take 237 mLs by mouth daily. 30 Bottle 0  . Feeding Supplies MISC by Does not apply route.    . fluticasone (FLONASE) 50 MCG/ACT nasal spray Place 1 spray into both nostrils at bedtime as needed for allergies.     . fluticasone furoate-vilanterol (BREO ELLIPTA) 200-25 MCG/INH AEPB Inhale 1 puff into the lungs daily. 60 each 5  . latanoprost (XALATAN) 0.005 % ophthalmic solution Place 1 drop into both eyes at bedtime.    . montelukast (SINGULAIR) 10 MG tablet Take 1 tablet (10 mg total) by mouth at bedtime. 30 tablet 3  . pravastatin (PRAVACHOL) 40 MG tablet Take 40 mg by mouth daily.     No current facility-administered medications for this visit.    No Known Allergies  Social History   Socioeconomic History  . Marital status: Widowed    Spouse name: Not on file  . Number of children: Not on file  . Years of education: Not on file  . Highest education level: Not on file  Occupational History  . Not on file  Tobacco Use  . Smoking status: Former Smoker    Packs/day: 1.00    Years: 1.00    Pack years: 1.00    Types: Cigarettes    Quit date: 07/14/1960    Years since quitting: 58.7  . Smokeless tobacco: Never Used  Substance and Sexual Activity  . Alcohol use: No  . Drug use: No  . Sexual activity: Not on file  Other Topics Concern  . Not on file  Social History Narrative  . Not on file   Social Determinants of  Health   Financial Resource Strain:   . Difficulty of Paying Living Expenses:   Food Insecurity:   . Worried About Charity fundraiser in the Last Year:   . Arboriculturist in the Last Year:   Transportation Needs:   . Film/video editor (Medical):   Marland Kitchen Lack of Transportation (Non-Medical):   Physical Activity:   . Days of Exercise per Week:   . Minutes of Exercise per Session:   Stress:   . Feeling of Stress :   Social Connections:   . Frequency of Communication with Friends and Family:   . Frequency of Social Gatherings with Friends and Family:   . Attends Religious Services:   . Active Member of Clubs or Organizations:   . Attends Archivist Meetings:   Marland Kitchen Marital Status:   Intimate Partner Violence:   . Fear of Current or Ex-Partner:   . Emotionally Abused:   Marland Kitchen Physically Abused:   .  Sexually Abused:      Review of Systems: General: No chills, fever, night sweats or weight changes  Cardiovascular:  No chest pain, dyspnea on exertion, edema, orthopnea, palpitations, paroxysmal nocturnal dyspnea Dermatological: No rash, lesions or masses Respiratory: No cough, dyspnea Urologic: No hematuria, dysuria Abdominal: No nausea, vomiting, diarrhea, bright red blood per rectum, melena, or hematemesis Neurologic: No visual changes, weakness, changes in mental status All other systems reviewed and are otherwise negative except as noted above.  Physical Exam: Vitals:   04/16/19 0944  BP: 98/64  Pulse: 77  SpO2: 92%  Weight: 112 lb 6.4 oz (51 kg)  Height: 5' 9.5" (1.765 m)    GEN- The patient is well appearing, alert and oriented x 3 today.   HEENT: normocephalic, atraumatic; sclera clear, conjunctiva pink; hearing intact; oropharynx clear; neck supple, no JVP Lymph- no cervical lymphadenopathy Lungs- Clear to ausculation bilaterally, normal work of breathing.  No wheezes, rales, rhonchi Heart- Regular rate and rhythm, no murmurs, rubs or gallops, PMI not  laterally displaced GI- soft, non-tender, non-distended, bowel sounds present, no hepatosplenomegaly Extremities- no clubbing, cyanosis, or edema; DP/PT/radial pulses 2+ bilaterally MS- no significant deformity or atrophy Skin- warm and dry, no rash or lesion Psych- euthymic mood, full affect Neuro- strength and sensation are intact  EKG is ordered. Personal review of EKG from today shows NSR at 77 bpm with PR interval 186 ms and IVCD with QRS 106 ms.   Assessment and Plan:  1. Atrial flutter Event monitor showed short episodes of AFL.  Previously offered ablation; declined. Continue conservative for now with lack of symptoms.  Avoiding BB with lack of symptoms and h/o bradycardia  2. Sinus bradycardia Avoid AV nodal blocking agents where possible.  3. NSVT Asymptomatic. Normal EF.   RTC 6 months. Sooner as needed.   Shirley Friar, PA-C  04/16/19 10:06 AM

## 2019-04-16 ENCOUNTER — Ambulatory Visit (INDEPENDENT_AMBULATORY_CARE_PROVIDER_SITE_OTHER): Payer: Medicare Other | Admitting: Student

## 2019-04-16 ENCOUNTER — Encounter: Payer: Self-pay | Admitting: Student

## 2019-04-16 ENCOUNTER — Other Ambulatory Visit: Payer: Self-pay

## 2019-04-16 VITALS — BP 98/64 | HR 77 | Ht 69.5 in | Wt 112.4 lb

## 2019-04-16 DIAGNOSIS — I483 Typical atrial flutter: Secondary | ICD-10-CM

## 2019-04-16 DIAGNOSIS — I2510 Atherosclerotic heart disease of native coronary artery without angina pectoris: Secondary | ICD-10-CM

## 2019-04-16 DIAGNOSIS — I10 Essential (primary) hypertension: Secondary | ICD-10-CM | POA: Diagnosis not present

## 2019-04-16 DIAGNOSIS — I480 Paroxysmal atrial fibrillation: Secondary | ICD-10-CM

## 2019-04-16 DIAGNOSIS — I4729 Other ventricular tachycardia: Secondary | ICD-10-CM

## 2019-04-16 DIAGNOSIS — I472 Ventricular tachycardia: Secondary | ICD-10-CM | POA: Diagnosis not present

## 2019-04-16 NOTE — Patient Instructions (Addendum)
Medication Instructions:  none *If you need a refill on your cardiac medications before your next appointment, please call your pharmacy*   Lab Work: none If you have labs (blood work) drawn today and your tests are completely normal, you will receive your results only by: Marland Kitchen MyChart Message (if you have MyChart) OR . A paper copy in the mail If you have any lab test that is abnormal or we need to change your treatment, we will call you to review the results.   Testing/Procedures: none   Follow-Up: At [facility], you and your health needs are our priority.  As part of our continuing mission to provide you with exceptional heart care, we have created designated Provider Care Teams.  These Care Teams include your primary Cardiologist (physician) and Advanced Practice Providers (APPs -  Physician Assistants and Nurse Practitioners) who all work together to provide you with the care you need, when you need it.   Your next appointment:   6 months  The format for your next appointment:   Either In Person or Virtual  Provider:   Dr Rayann Heman   Other Instructions

## 2019-04-21 ENCOUNTER — Encounter: Payer: Self-pay | Admitting: Physician Assistant

## 2019-04-21 ENCOUNTER — Encounter: Payer: Self-pay | Admitting: Neurology

## 2019-04-21 ENCOUNTER — Other Ambulatory Visit: Payer: Self-pay

## 2019-04-21 ENCOUNTER — Ambulatory Visit (INDEPENDENT_AMBULATORY_CARE_PROVIDER_SITE_OTHER): Payer: Medicare Other | Admitting: Physician Assistant

## 2019-04-21 VITALS — BP 106/56 | HR 79 | Ht 69.5 in | Wt 129.8 lb

## 2019-04-21 DIAGNOSIS — I4892 Unspecified atrial flutter: Secondary | ICD-10-CM

## 2019-04-21 DIAGNOSIS — I10 Essential (primary) hypertension: Secondary | ICD-10-CM | POA: Diagnosis not present

## 2019-04-21 DIAGNOSIS — Z952 Presence of prosthetic heart valve: Secondary | ICD-10-CM

## 2019-04-21 DIAGNOSIS — I5032 Chronic diastolic (congestive) heart failure: Secondary | ICD-10-CM | POA: Diagnosis not present

## 2019-04-21 DIAGNOSIS — J90 Pleural effusion, not elsewhere classified: Secondary | ICD-10-CM

## 2019-04-21 DIAGNOSIS — I2510 Atherosclerotic heart disease of native coronary artery without angina pectoris: Secondary | ICD-10-CM | POA: Diagnosis not present

## 2019-04-21 DIAGNOSIS — E7800 Pure hypercholesterolemia, unspecified: Secondary | ICD-10-CM

## 2019-04-21 DIAGNOSIS — R2689 Other abnormalities of gait and mobility: Secondary | ICD-10-CM

## 2019-04-21 DIAGNOSIS — I480 Paroxysmal atrial fibrillation: Secondary | ICD-10-CM | POA: Diagnosis not present

## 2019-04-21 NOTE — Patient Instructions (Signed)
Medication Instructions:  Your physician recommends that you continue on your current medications as directed. Please refer to the Current Medication list given to you today.  *If you need a refill on your cardiac medications before your next appointment, please call your pharmacy*   Lab Work: None ordered  If you have labs (blood work) drawn today and your tests are completely normal, you will receive your results only by: Marland Kitchen MyChart Message (if you have MyChart) OR . A paper copy in the mail If you have any lab test that is abnormal or we need to change your treatment, we will call you to review the results.   Testing/Procedures: None ordered   Follow-Up: You have been referred to Neurology   At [facility], you and your health needs are our priority.  As part of our continuing mission to provide you with exceptional heart care, we have created designated Provider Care Teams.  These Care Teams include your primary Cardiologist (physician) and Advanced Practice Providers (APPs -  Physician Assistants and Nurse Practitioners) who all work together to provide you with the care you need, when you need it.  We recommend signing up for the patient portal called "MyChart".  Sign up information is provided on this After Visit Summary.  MyChart is used to connect with patients for Virtual Visits (Telemedicine).  Patients are able to view lab/test results, encounter notes, upcoming appointments, etc.  Non-urgent messages can be sent to your provider as well.   To learn more about what you can do with MyChart, go to [email or web address].    Your next appointment:   6 month(s)  The format for your next appointment:   In Person  Provider:   You may see Fransico Him, MD or one of the following Advanced Practice Providers on your designated Care Team:    Melina Copa, PA-C  Ermalinda Barrios, PA-C    Other Instructions

## 2019-04-30 DIAGNOSIS — L258 Unspecified contact dermatitis due to other agents: Secondary | ICD-10-CM | POA: Diagnosis not present

## 2019-05-07 DIAGNOSIS — J3089 Other allergic rhinitis: Secondary | ICD-10-CM | POA: Diagnosis not present

## 2019-05-07 DIAGNOSIS — J3081 Allergic rhinitis due to animal (cat) (dog) hair and dander: Secondary | ICD-10-CM | POA: Diagnosis not present

## 2019-05-07 DIAGNOSIS — J301 Allergic rhinitis due to pollen: Secondary | ICD-10-CM | POA: Diagnosis not present

## 2019-05-12 ENCOUNTER — Encounter: Payer: Self-pay | Admitting: Internal Medicine

## 2019-05-12 ENCOUNTER — Other Ambulatory Visit: Payer: Self-pay

## 2019-05-12 ENCOUNTER — Ambulatory Visit (INDEPENDENT_AMBULATORY_CARE_PROVIDER_SITE_OTHER): Payer: Medicare Other | Admitting: Internal Medicine

## 2019-05-12 VITALS — BP 106/64 | HR 82 | Temp 97.2°F | Ht 69.5 in | Wt 132.0 lb

## 2019-05-12 DIAGNOSIS — Z9109 Other allergy status, other than to drugs and biological substances: Secondary | ICD-10-CM | POA: Diagnosis not present

## 2019-05-12 DIAGNOSIS — Z87898 Personal history of other specified conditions: Secondary | ICD-10-CM

## 2019-05-12 DIAGNOSIS — J4540 Moderate persistent asthma, uncomplicated: Secondary | ICD-10-CM

## 2019-05-12 DIAGNOSIS — R918 Other nonspecific abnormal finding of lung field: Secondary | ICD-10-CM | POA: Diagnosis not present

## 2019-05-12 DIAGNOSIS — I2510 Atherosclerotic heart disease of native coronary artery without angina pectoris: Secondary | ICD-10-CM

## 2019-05-12 DIAGNOSIS — R768 Other specified abnormal immunological findings in serum: Secondary | ICD-10-CM

## 2019-05-12 NOTE — Progress Notes (Signed)
IOV 07/31/2017   Chief Complaint  Patient presents with  . Consult    Referred by Dr. Radford Pax due to pericardial effusion.  Pt has had a cardiac cath performed 5/24 and also a thoracentesis performed 5/15. Pt states he has a lot of phlegm that is white in color.    [name] [name] , 84 y.o. , with dob [birth year] and male ,Not Hispanic or Latino from [place] [address] - presents to lung  clinic for evaluation of pleuropericardial effusion associated with weight loss.history is gained from talking to him and review of the referring physician notes and old chart. It is a very complicated complex history. As best as I can tell in May 2018 he underwent coronary artery stent RCA and in June 2018 and underwent TAVR rom severe aortic stenosis. He was known to have had chronic pericardial effusion even at the time off TAVR in June 2018. He tells me starting fall 2018he started noticing weight loss and cough and was? Admitted for pneumonia. After that he lost30-40 pounds. These problems persisted through the spring of 2019 when they settled. In the midst of this in February 2019 he underwent a CT scan of the chestthat I personally visualized and shows groundglass opacities with lung nodules but no pleural effusion at this time. He had a follow-up CT chest in May 2019 at which time the cough had resolved. Concomitant with the resolution of the cough is lung nodules and ground glass opacities have resolved but he had new onset bilateral pleural effusion. He then had left thoracentesis which she says was 1 L middle May 2019which I review the results and shows a transudate. He had a follow-up chest x-ray that showed resolution of the  Pleural effusion. After that he has not had any further radiologic imaging of the chest. 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 He underwent  echocardiogram a few days ago July 2019 as follow-up and the pericardial effusion has resolved. Since May 2019 he does not have the cough the weight loss has stabilized. However he has not been able to gain weight. He is now able to work 20 minutes in the yard but then after that feels extremely exhausted that he has to go to the bedhe is not happy with this outcome he wants to be able to do more but he does admit that this is better than 8 months ago and even a few months ago.He has been referred here because of the constellation of weight loss in the pericardial and pleural effusions.  He denies any other stigmata of autoimmune vasculitis disease including malar rash or photophobia or oral ulcers. He does have easy bruising but he is on antiplatelet therapy   Results for [name] [name] (MRN [id number]) as of 07/31/2017 09:25  Ref. Range 05/08/2017 10:55  Anti Nuclear Antibody(ANA) Latest Ref Range: Negative  Negative  RA Latex Turbid. Latest Ref Range: 0.0 - 13.9 IU/mL <10.0   Most recent blood labs in 11.3 g percent May 2019, sedimentation rate 58 and April 2019nd creatinine 0.9 mg percent June 2019     OV 09/19/2017  Subjective:  Patient ID: [name] [name], male , DOB: 1935/12/10 , age 10 y.o. , MRN: [id number] , ADDRESS: [address] [place] [address]   09/19/2017 -   Chief Complaint  Patient presents with  . Follow-up  breathing still the same, cough with white/gray color      HPI [name] [name] 84 y.o. -followup for unusual constellation of    ICD-10-CM   1. Chronic fatigue R53.82   2. Weight loss, non-intentional R63.4   3. Dyspnea on exertion R06.09   4. Physical deconditioning R53.81   5. History of pleural effusion Z87.09   6. Hx peridcardial effsion  - He says his last follow-up visit weight loss. But he has not gained any weight. He works with physical therapy and he does bike exercise and he stopped because of proximal muscle cramps and pain but his CK was  normal in July 2029 he says he lost all his weight after the pneumonia including mostly muscle mass. He says he is trying to regain it but not able to. There no history of muscular fasciculations. He also get short of breath although I'm unable to differentiate if this is fatigue or shortness of breath particularly with exertion. Chest x-ray July 2019 did not show any obvious recurrence of pericardial or pleural effusion.His last CT chest was in February 2019       Simple office walk 185 feet x  3 laps goal with forehead probe 07/31/2017   O2 used Room air  Number laps completed 3  Comments about pace normal  Resting Pulse Ox/HR 97% and 69/min  Final Pulse Ox/HR 100% and 64/min  Desaturated </= 88% no  Desaturated <= 3% points no  Got Tachycardic >/= 90/min no  Symptoms at end of test Not reported  Miscellaneous comments none     ROS OV 10/10/2017  Subjective:  Patient ID: [name] [name], male , DOB: [birth year] , age 45 y.o. , MRN: [id number] , ADDRESS: [address] [place] [address]   10/10/2017 -   Chief Complaint  Patient presents with  . Follow-up    HRCT performed 9/16.  Pt states he feels like he has become worse since last visit. States the cough has become worse and he still has SOB. Pt wonders if the pna is coming back.      HPI [name] [name] 84 y.o. -presents for follow-up of his symptoms of constellation of weight loss physical deconditioning shortness of breath and cough.  After last visit we had him undergo a high-resolution CT chest that he did September 29, 2017.  Previous issues of pericardial effusion or thickening and pleural effusion have resolved on the CT chest that I personally visualized.  His lung parenchyma looks pretty healthy.  There is no evidence of interstitial lung disease.  There might be some mild bronchiectasis and some small nodules per radiology.  None of this explain his symptomatology.  He tells me he continues to have shortness of  breath with exertion although this could also be fatigue and deconditioning.     He also reports a chronic cough for the last several months.  He is reporting this to me for the first time.  Therefore it is a new complaint.  Happens mostly at night and when he lies down.  He has some white sputum.  He thinks it is sinus at drainage and allergy related.  He has been advised to do sinus cleansing by his allergist.  He is not on inhalers.  We also did autoimmune and vasculitis panel and it was negative.  This documented below.  His CK in June 2019 was normal.  His exhaled nitric oxide -today in our office- significantly elevated at 101 ppb  Results for MILTON, LANIER (MRN [id number]) as of 10/10/2017 10:43  Ref. Range 07/31/2017 10:07  Anti Nuclear Antibody(ANA) Latest Ref Range: NEGATIVE  NEGATIVE  ANCA Proteinase 3 Latest Ref Range: 0.0 - 3.5 U/mL <3.5  Angiotensin-Converting Enzyme Latest Ref Range: 9 - 67 U/L 24  Cyclic Citrullin Peptide Ab Latest Units: UNITS <16  ds DNA Ab Latest Units: IU/mL <1  Myeloperoxidase Ab Latest Ref Range: 0.0 - 9.0 U/mL <9.0  RA Latex Turbid. Latest Ref Range: <14 IU/mL <14  Ribonucleic Protein(ENA) Antibody, IgG Latest Ref Range: <1.0 NEG AI <1.0 NEG  SSA (Ro) (ENA) Antibody, IgG Latest Ref Range: <1.0 NEG AI <1.0 NEG  SSB (La) (ENA) Antibody, IgG Latest Ref Range: <1.0 NEG AI <1.0 NEG  Scleroderma (Scl-70) (ENA) Antibody, IgG Latest Ref Range: <1.0 NEG AI <1.0 NEG    OV 10/24/2017  Subjective:  Patient ID: [name] [name], male , DOB: 07/21/1935 , age 51 y.o. , MRN: [id number] , ADDRESS: [address] [place] [facility] [address]   10/24/2017 -   Chief Complaint  Patient presents with  . Follow-up    Doing better cough is almost gone, had to go to the hospital and monday for a Cardio Adverstion.     HPI [name] [name] 84 y.o. -follow-up cough variant asthma with exam nitric oxide being high.  Last visit we started him on Breo.  He is reporting for  follow-up.  He tells [place] that the cough is significantly improved.  It is gone from a scale of 5->1.  In association with this is nitric oxide test is improved from 100 ppb last visit to 39 ppb today.  He had reported a strong history of seasonal and environmental allergies for over 30 years.  He follows with Dr. Donneta Romberg.  He is on allergy shots.  Therefore we did a blood test for eosinophilia and blood allergy profile.  The results were abnormal significantly.  His IgE is significantly elevated in the ABPA range.  His aspergillus precipitants are all positive.  However his recent CT scan is essentially normal except for some mild bronchiectasis.  Therefore he is at risk for clinical ABPA although he would qualify for a diagnosis of serological ABPA at this point.  In talking to him he initially said he did not have any mold exposure but it appears up until recently may be a year or 2 ago he was gardening quite heavily in a damp environment.  He has his house more checked and apparently within the last 2 years and it was clean.  He is not sure if he uses a feather pillow.  He does not play wind instruments.  There is occasional mildew in the shower curtain.   Results for JUTIN, MISKO (MRN [id number]) as of 10/24/2017 09:26  Ref. Range 08/07/2006 11:07 12/01/2016 14:24 04/23/2017 13:41 06/04/2017 11:36 10/10/2017 11:38 10/21/2017 08:22  EOS (ABSOLUTE) Latest Ref Range: 0.0 - 0.4 x10E3/uL    0.2  0.4   Results for ASEEM, MERISIER (MRN [id number]) as of 10/24/2017 09:26  Ref. Range 10/15/2017 10:08  Aspergillus fumigatus IgE Latest Ref Range: <0.35 kU/L 2.67 (H)  A. Fumigatus Class Interp Unknown 2  Aspergillus [place] IgE Latest Ref Range: <0.35 kU/L 3.35 (H)  A. [place] Class Interp Unknown 2  Aspergillus Versicolor IgE Latest Ref Range: <0.35 kU/L 0.63 (H)  A. Versicolor Class Interp Unknown 1  Aspergillus amstel/glaucu IgE* Latest Ref Range: <0.35 kU/L <0.35  A. Amstel/Glaucu Class Interp Unknown 0  Aspergillus flavus IgE Latest Ref Range: <0.35 kU/L <0.35  A. Flavus Class Interp Unknown 0  Aspergillus nidulans IgE Latest Ref Range: <0.35 kU/L <0.35  A. Nidulans Class Interp Unknown 0  Aspergillus Terreus IgE Latest Ref Range: <0.35 kU/L <0.35  A. Terreus Class Interp Unknown 0  Results for DAMARR, BLASKE (MRN [id number]) as of 10/24/2017 09:26  Ref. Range 10/15/2017 10:08 10/15/2017 10:27  Sheep Sorrel IgE Latest Units: kU/L 0.11 (H)   Pecan/Hickory Tree IgE Latest Units: kU/L 0.16 (H)   IgE (Immunoglobulin E), Serum Latest Ref Range: <OR=114 kU/L 2,897 (H)   Allergen, D pternoyssinus,d7 Latest Units: kU/L 0.58 (H)   Cat Dander Latest Units: kU/L 4.29 (H)   Dog Dander Latest Units: kU/L 0.30 (H)   [place] Grass Latest Units: kU/L 2.10 (H)   Johnson Grass Latest Units: kU/L 1.30 (H)   Timothy Grass Latest Units: kU/L 4.30 (H)   Cockroach Latest Units: kU/L 0.13 (H)   Aspergillus fumigatus, m3 Latest Units: kU/L 3.05 (H) 2.81 (H)  Allergen, Comm Silver Wendee Copp, t9 Latest Units: kU/L 0.11 (H)   Allergen, Cottonwood, t14 Latest Units: kU/L 0.19 (H)   Elm IgE Latest Units: kU/L 0.18 (H)   Allergen, Mulberry, t76 Latest Units: kU/L <0.10   Allergen, Oak,t7 Latest Units: kU/L 0.11 (H)   COMMON RAGWEED (SHORT) (W1) IGE Latest Units: kU/L 0.43 (H)   Allergen, Mouse Urine Protein, e78 Latest Units: kU/L <0.10   D. farinae Latest Units: kU/L 0.66 (H)   Allergen, Cedar tree, t12 Latest Units: kU/L 0.13 (H)   Box Elder IgE Latest Units: kU/L 0.17 (H)   Rough Pigweed  IgE Latest Units: kU/L 0.12 (H)   A-1 Antitrypsin, Ser Latest Ref Range: 83 - 199 mg/dL 159    Results for PAULINE, MITRO (MRN [id number]) as of 10/24/2017 09:26  Ref. Range 05/08/2017 10:55 07/31/2017 10:07 10/10/2017 11:44  Sed Rate Latest Ref Range: 0 - 20 mm/hr 58 (H) 34 (H) 60 (H)   ROS - per HPI  OV 05/12/2019  Subjective:  Patient ID: [name] [name], male , DOB: 1935-04-05 , age 47 y.o. , MRN: [id number] , ADDRESS:  [address] [place] [address]   05/12/2019 -   Chief Complaint  Patient presents with  . Follow-up    Pt states he has been doing good since last visit. Pt denies any complaints of cough or SOB.   Follow-up cough variant asthma with serological ABPA and history of pulmonary nodules and high exhaled nitric oxide test and high AA IgE  HPI [name] [name] 84 y.o. -returns for follow-up.  Personally not seen him since 2019.  Overall he is doing well.  He says.  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  He says he is continuing with his allergy shots.  It appears that he is not on the Singulair anymore he does not know why.  He also says in the last 1 month he stopped his Breo.  He says he feels fine without it.  However on asthma control questionnaire he says he wakes up a few times at night because of asthma when he wakes up he has mild symptoms.  He is very slightly limited in his activities because of asthma.  Overall a CQ score is 1.4 and shows activity of asthma.  He does not use albuterol for rescue.     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), BPH (benign prostatic hypertrophy), Chronic diastolic CHF (congestive heart failure) ([place]) (10/16/2017), Coronary artery disease involving native coronary artery of native heart without angina pectoris, Dilated aortic root ([place]), Glaucoma, H/O seasonal allergies, adenomatous colonic polyps (1991 on), Hyperlipidemia, Mitral valve prolapse, Multifocal pneumonia (12/01/2016), OA (osteoarthritis) of hip (10/16/2016), PAF (paroxysmal atrial fibrillation) ([place]), Paresthesia, Pericardial effusion (11/05/2016), Postural dizziness with presyncope (04/23/2017), Pulmonary hypertension, moderate to severe ([place]), and Pulmonary nodules.   reports that he quit smoking about 58 years ago.  His smoking use included cigarettes. He has a 1.00 pack-year smoking history. He has never used smokeless tobacco.  Past Surgical History:  Procedure Laterality Date  . CARDIOVERSION N/A 10/27/2017   Procedure: CARDIOVERSION;  Surgeon: Josue Hector, MD;  Location: [facility] ENDOSCOPY;  Service: Cardiovascular;  Laterality: N/A;  . COLONOSCOPY  multiple  . CORONARY STENT INTERVENTION N/A 06/06/2016   Procedure: Coronary Stent Intervention;  Surgeon: [name], MD;  Location: [facility] CV LAB;  Service: Cardiovascular;  Laterality: N/A;  . CYSTOSCOPY  08/12/2016   per patient; showed a cyst on bladder , liver , and kidney , sees urologist Matilde [facility] at [facility]  . EYE SURGERY     Bilateral cataract removal  . INTRAVASCULAR PRESSURE WIRE/FFR STUDY N/A 05/29/2016   Procedure: Intravascular Pressure Wire/FFR Study;  Surgeon: [name], MD;  Location: [facility] CV LAB;  Service: Cardiovascular;  Laterality: N/A;  . IR THORACENTESIS ASP PLEURAL SPACE W/IMG GUIDE  05/28/2017  . RIGHT/LEFT HEART CATH AND CORONARY ANGIOGRAPHY N/A 05/29/2016   Procedure: Right/Left Heart Cath and Coronary Angiography;  Surgeon: [name], MD;  Location: [facility] CV LAB;  Service: Cardiovascular;  Laterality: N/A;  . RIGHT/LEFT HEART CATH AND CORONARY ANGIOGRAPHY N/A 06/06/2017   Procedure: RIGHT/LEFT HEART CATH AND CORONARY ANGIOGRAPHY;  Surgeon: Larey Dresser, MD;  Location: [facility] CV LAB;  Service: Cardiovascular;  Laterality: N/A;  . TEE WITHOUT CARDIOVERSION N/A 07/02/2016   Procedure: TRANSESOPHAGEAL ECHOCARDIOGRAM (TEE);  Surgeon: [name], MD;  Location: [facility];  Service: Open Heart Surgery;  Laterality: N/A;  . TONSILLECTOMY  1949  . TOTAL HIP ARTHROPLASTY Left 10/16/2016   Procedure: LEFT TOTAL HIP ARTHROPLASTY ANTERIOR APPROACH;  Surgeon: Gaynelle Arabian, MD;  Location: [facility] ORS;  Service: Orthopedics;  Laterality: Left;  . TRANSCATHETER AORTIC VALVE REPLACEMENT, TRANSFEMORAL N/A  07/02/2016   Procedure: TRANSCATHETER AORTIC VALVE REPLACEMENT, TRANSFEMORAL;  Surgeon: [name], MD;  Location: [facility];  Service: Open Heart Surgery;  Laterality: N/A;    No Known Allergies  Immunization History  Administered Date(s) Administered  . Fluad Quad(high Dose 65+) 10/15/2018  . Influenza, High Dose Seasonal PF 10/17/2016, 10/24/2017  . PFIZER SARS-COV-2 Vaccination 02/18/2019, 03/16/2019    Family History  Problem Relation Age of Onset  . Emphysema Mother   . Pancreatic cancer Father   . Heart attack Brother   . Heart disease Brother   . Colon cancer Neg Hx   . Stomach cancer Neg Hx      Current Outpatient Medications:  .  augmented betamethasone dipropionate (DIPROLENE-AF) 0.05 % cream, Apply 1 application topically 2 (two) times daily., Disp: , Rfl:  .  ELIQUIS 2.5 MG TABS tablet, TAKE 1 TABLET BY MOUTH TWICE DAILY., Disp: 180 tablet, Rfl: 1 .  fluticasone (FLONASE) 50 MCG/ACT nasal spray, Place 1 spray into both nostrils at bedtime as needed for allergies. , Disp: , Rfl:  .  Multiple  Vitamins-Minerals (PRESERVISION AREDS 2 PO), Take 2 tablets by mouth daily., Disp: , Rfl:  .  pravastatin (PRAVACHOL) 40 MG tablet, Take 40 mg by mouth daily., Disp: , Rfl:  .  UNABLE TO FIND, Place 1 drop into both eyes at bedtime. Med Name: Rhopresso (netorsudil ophthalmic solution), Disp: , Rfl:  .  UNABLE TO FIND, Place 3 drops into both eyes daily. Med Name: Alcon Azorp (brinsalamide ophthalmic solution), Disp: , Rfl:  .  fluticasone furoate-vilanterol (BREO ELLIPTA) 200-25 MCG/INH AEPB, Inhale 1 puff into the lungs daily. (Patient not taking: Reported on 05/12/2019), Disp: 60 each, Rfl: 5      Objective:   Vitals:   05/12/19 1017  BP: 106/64  Pulse: 82  Temp: (!) 97.2 F (36.2 C)  TempSrc: Temporal  SpO2: 98%  Weight: 132 lb (59.9 kg)  Height: 5' 9.5" (1.765 m)    Estimated body mass index is 19.21 kg/m as calculated from the following:   Height as of this  encounter: 5' 9.5" (1.765 m).   Weight as of this encounter: 132 lb (59.9 kg).  @WEIGHTCHANGE @  [employer]   05/12/19 1017  Weight: 132 lb (59.9 kg)     Physical Exam Thin male overall nonfocal exam no oral thrush.  However there is some faint expiratory wheeze particularly on the left side otherwise nonfocal exam.         Assessment:       ICD-10-CM   1. Moderate persistent extrinsic asthma without complication  123456   2. Elevated IgE level  R76.8   3. Multiple environmental allergies  Z91.09   4. H/O multiple pulmonary nodules  Z87.898   5. Abnormal findings on diagnostic imaging of lung  R91.8        Plan:     Patient Instructions     ICD-10-CM   1. Moderate persistent extrinsic asthma without complication  123456   2. Elevated IgE level  R76.8   3. Multiple environmental allergies  Z91.09   4. H/O multiple pulmonary nodules  Z87.898     I think her asthma is slightly active without the [place] daily -Continue allergy shots through allergist -Albuterol as needed -If your allergist did not stop Singulair you should restart this as well  Follow-up -need to reassess  -In 2 months do CT chest without contrast -In 2 months do spirometry with and without bronchodilator response and DLCO -In 2 months do exhaled nitric oxide test -Return to see Dr. Chase Caller in 3 months or sooner if needed      SIGNATURE    Dr. Brand Males, M.D., F.C.C.P,  Pulmonary and Critical Care Medicine Staff Physician, [place] Director - Interstitial Lung Disease  Program  [facility] [place] [facility] [place], [place], [address]  Pager: [phone], If no answer or between  15:00h - 7:00h: call [phone] Telephone: [phone]  10:47 AM 05/12/2019

## 2019-05-12 NOTE — Patient Instructions (Addendum)
ICD-10-CM   1. Moderate persistent extrinsic asthma without complication  123456   2. Elevated IgE level  R76.8   3. Multiple environmental allergies  Z91.09   4. H/O multiple pulmonary nodules  Z87.898     I think her asthma is slightly active without the [place] daily -Continue allergy shots through allergist -Albuterol as needed -If your allergist did not stop Singulair you should restart this as well  Follow-up -need to reassess  -In 2 months do CT chest without contrast -In 2 months do spirometry with and without bronchodilator response and DLCO -In 2 months do exhaled nitric oxide test -Return to see Dr. Chase Caller in 3 months or sooner if needed

## 2019-05-19 DIAGNOSIS — H26493 Other secondary cataract, bilateral: Secondary | ICD-10-CM | POA: Diagnosis not present

## 2019-05-19 DIAGNOSIS — H5213 Myopia, bilateral: Secondary | ICD-10-CM | POA: Diagnosis not present

## 2019-05-19 DIAGNOSIS — H401123 Primary open-angle glaucoma, left eye, severe stage: Secondary | ICD-10-CM | POA: Diagnosis not present

## 2019-05-19 DIAGNOSIS — H353131 Nonexudative age-related macular degeneration, bilateral, early dry stage: Secondary | ICD-10-CM | POA: Diagnosis not present

## 2019-06-07 DIAGNOSIS — G3184 Mild cognitive impairment, so stated: Secondary | ICD-10-CM | POA: Diagnosis not present

## 2019-06-09 DIAGNOSIS — J301 Allergic rhinitis due to pollen: Secondary | ICD-10-CM | POA: Diagnosis not present

## 2019-06-09 DIAGNOSIS — J3081 Allergic rhinitis due to animal (cat) (dog) hair and dander: Secondary | ICD-10-CM | POA: Diagnosis not present

## 2019-06-09 DIAGNOSIS — J3089 Other allergic rhinitis: Secondary | ICD-10-CM | POA: Diagnosis not present

## 2019-06-21 NOTE — Progress Notes (Addendum)
NEUROLOGY CONSULTATION NOTE  [name] [name] MRN: [id number] DOB: 1935/09/26  Referring provider: Ermalinda Barrios, PA-C Primary care provider: Maury Dus, MD  Reason for consult:  Patient reports that he is here for evaluation of memory deficits.  HISTORY OF PRESENT ILLNESS: [name] [name] is an 84 year old right-handed male with bicuspid aortic valve with severe AS s/p TAVR 06/2016, paroxsymal atrial fibrillation, CHF, non-obstructive CAD, HLD, and HTN.  Referral was for balance problems but patient is more concerned about his memory.  He was a Designer, fashion/clothing (Ph.D) and always had a sharp memory.  He still participates in seminars on the internet to discuss theorems and papers.  He feels that he is having trouble quite following the discussion.  Denies problems with calculations and remembering to pay bills.  He does not get disoriented driving on familiar routes.  He does not repeat questions frequently.  He remembers conversations with people.  Remembers close friends and family.  Nobody has raised any concern about his cognition.  Denies family history of dementia.    He has had balance problems for a few years.  He reports numbness in his toes.  Labs: 06/28/2016 HGB A1c 5.5 12/02/2016 HIV antibody negative 05/16/2017 TSH 2.740 07/31/2017 ANA negatve, CK 63, aldolase 5.9, RNP antibody IgG negative,  10/10/2017  CK 72, sed rate 60, AChR antibodies negative, ANCA negative, SSA/SSB antibodies negative, MPO antibody negative, PR-3 antibody negative, Scl-70 antibody IgG negative, Cyclic Citrullin peptide antibody negative, RF negative, ds-DNA antibody negative, ACE 24, sed rate 34  PAST MEDICAL HISTORY: Past Medical History:  Diagnosis Date  . Actinic keratoses    Dr Glee Arvin  . Allergic rhinitis   . Aortic stenosis    Bicuspid AV with severe AS s/p TAVR 06/2016  . Arthritis of left hip 11/03/2014  . Benign essential HTN   . Bilateral cataracts   . Bladder diverticulum  06/14/2016  . BPH (benign prostatic hypertrophy)   . Chronic diastolic CHF (congestive heart failure) ([place]) 10/16/2017  . Coronary artery disease involving native coronary artery of native heart without angina pectoris    PCI 06/06/16 LAD proximal 60 (FFR 0.85-negative); RI 50; LCx calcified without significant stenosis; RCA mid 80 (FFR 0.68-hemodynamically significant); PCI: 2.25 x 16 mm Synergy DES to the mid RCA  . Dilated aortic root ([place])    38mm by echo 08/2015  . Glaucoma   . H/O seasonal allergies   . Hx of adenomatous colonic polyps 1991 on  . Hyperlipidemia   . Mitral valve prolapse    mild MR by echo 08/2015  . Multifocal pneumonia 12/01/2016  . OA (osteoarthritis) of hip 10/16/2016  . PAF (paroxysmal atrial fibrillation) ([place])    S/P DCCV 10/2017  . Paresthesia    Chronic left lateral thigh  . Pericardial effusion 11/05/2016  . Postural dizziness with presyncope 04/23/2017  . Pulmonary hypertension, moderate to severe (HCC)    moderate by right heart cath 02/2017  . Pulmonary nodules     PAST SURGICAL HISTORY: Past Surgical History:  Procedure Laterality Date  . CARDIOVERSION N/A 10/27/2017   Procedure: CARDIOVERSION;  Surgeon: Josue Hector, MD;  Location: [facility] ENDOSCOPY;  Service: Cardiovascular;  Laterality: N/A;  . COLONOSCOPY  multiple  . CORONARY STENT INTERVENTION N/A 06/06/2016   Procedure: Coronary Stent Intervention;  Surgeon: [name], MD;  Location: [place] CV LAB;  Service: Cardiovascular;  Laterality: N/A;  . CYSTOSCOPY  08/12/2016   per patient; showed a cyst on bladder ,  liver , and kidney , sees urologist [name] [name] at [facility]  . EYE SURGERY     Bilateral cataract removal  . INTRAVASCULAR PRESSURE WIRE/FFR STUDY N/A 05/29/2016   Procedure: Intravascular Pressure Wire/FFR Study;  Surgeon: [name], MD;  Location: [place] CV LAB;  Service: Cardiovascular;  Laterality: N/A;  . IR THORACENTESIS ASP PLEURAL SPACE W/IMG GUIDE  05/28/2017    . RIGHT/LEFT HEART CATH AND CORONARY ANGIOGRAPHY N/A 05/29/2016   Procedure: Right/Left Heart Cath and Coronary Angiography;  Surgeon: [name], MD;  Location: [place] CV LAB;  Service: Cardiovascular;  Laterality: N/A;  . RIGHT/LEFT HEART CATH AND CORONARY ANGIOGRAPHY N/A 06/06/2017   Procedure: RIGHT/LEFT HEART CATH AND CORONARY ANGIOGRAPHY;  Surgeon: Larey Dresser, MD;  Location: [place] CV LAB;  Service: Cardiovascular;  Laterality: N/A;  . TEE WITHOUT CARDIOVERSION N/A 07/02/2016   Procedure: TRANSESOPHAGEAL ECHOCARDIOGRAM (TEE);  Surgeon: [name], MD;  Location: [place];  Service: Open Heart Surgery;  Laterality: N/A;  . TONSILLECTOMY  1949  . TOTAL HIP ARTHROPLASTY Left 10/16/2016   Procedure: LEFT TOTAL HIP ARTHROPLASTY ANTERIOR APPROACH;  Surgeon: Gaynelle Arabian, MD;  Location: [facility] ORS;  Service: Orthopedics;  Laterality: Left;  . TRANSCATHETER AORTIC VALVE REPLACEMENT, TRANSFEMORAL N/A 07/02/2016   Procedure: TRANSCATHETER AORTIC VALVE REPLACEMENT, TRANSFEMORAL;  Surgeon: [name], MD;  Location: [place];  Service: Open Heart Surgery;  Laterality: N/A;    MEDICATIONS: Current Outpatient Medications on File Prior to Visit  Medication Sig Dispense Refill  . augmented betamethasone dipropionate (DIPROLENE-AF) 0.05 % cream Apply 1 application topically 2 (two) times daily.    Marland Kitchen ELIQUIS 2.5 MG TABS tablet TAKE 1 TABLET BY MOUTH TWICE DAILY. 180 tablet 1  . fluticasone (FLONASE) 50 MCG/ACT nasal spray Place 1 spray into both nostrils at bedtime as needed for allergies.     . fluticasone furoate-vilanterol (BREO ELLIPTA) 200-25 MCG/INH AEPB Inhale 1 puff into the lungs daily. (Patient not taking: Reported on 05/12/2019) 60 each 5  . Multiple Vitamins-Minerals (PRESERVISION AREDS 2 PO) Take 2 tablets by mouth daily.    . pravastatin (PRAVACHOL) 40 MG tablet Take 40 mg by mouth daily.    Marland Kitchen UNABLE TO FIND Place 1 drop into both eyes at bedtime. Med Name: Rhopresso  (netorsudil ophthalmic solution)    . UNABLE TO FIND Place 3 drops into both eyes daily. Med Name: Alcon Azorp (brinsalamide ophthalmic solution)     No current facility-administered medications on file prior to visit.    ALLERGIES: No Known Allergies  FAMILY HISTORY: Family History  Problem Relation Age of Onset  . Emphysema Mother   . Pancreatic cancer Father   . Heart attack Brother   . Heart disease Brother   . Colon cancer Neg Hx   . Stomach cancer Neg Hx     SOCIAL HISTORY: Social History   Socioeconomic History  . Marital status: Widowed    Spouse name: Not on file  . Number of children: Not on file  . Years of education: Not on file  . Highest education level: Not on file  Occupational History  . Not on file  Tobacco Use  . Smoking status: Former Smoker    Packs/day: 1.00    Years: 1.00    Pack years: 1.00    Types: Cigarettes    Quit date: 07/14/1960    Years since quitting: 58.9  . Smokeless tobacco: Never Used  Substance and Sexual Activity  . Alcohol use: No  . Drug  use: No  . Sexual activity: Not on file  Other Topics Concern  . Not on file  Social History Narrative  . Not on file   Social Determinants of Health   Financial Resource Strain:   . Difficulty of Paying Living Expenses:   Food Insecurity:   . Worried About Charity fundraiser in the Last Year:   . Arboriculturist in the Last Year:   Transportation Needs:   . Film/video editor (Medical):   Marland Kitchen Lack of Transportation (Non-Medical):   Physical Activity:   . Days of Exercise per Week:   . Minutes of Exercise per Session:   Stress:   . Feeling of Stress :   Social Connections:   . Frequency of Communication with Friends and Family:   . Frequency of Social Gatherings with Friends and Family:   . Attends Religious Services:   . Active Member of Clubs or Organizations:   . Attends Archivist Meetings:   Marland Kitchen Marital Status:   Intimate Partner Violence:   . Fear of  Current or Ex-Partner:   . Emotionally Abused:   Marland Kitchen Physically Abused:   . Sexually Abused:     PHYSICAL EXAM: Blood pressure 137/69, pulse 73, height 5\' 8"  (1.727 m), weight 133 lb 6.4 oz (60.5 kg), SpO2 99 %. General: No acute distress.  Patient appears well-groomed.   Head:  Normocephalic/atraumatic Eyes:  fundi examined but not visualized Neck: supple, no paraspinal tenderness, full range of motion Back: No paraspinal tenderness Heart: regular rate and rhythm Lungs: Clear to auscultation bilaterally. Vascular: No carotid bruits. Neurological Exam: Mental status: alert and oriented to person, place, and time, recent and remote memory intact, fund of knowledge intact, attention and concentration intact, speech fluent and not dysarthric, language intact. [name] [name] Exam 06/22/2019  Weekday Correct 1  Current year 1  What state are we in? 1  Amount spent 1  Amount left 2  # of Animals 3  5 objects recall 4  Number series 2  Hour markers 2  Time correct 0  Placed X in triangle correctly 1  Largest Figure 1  Name of male 2  Date back to work 0  Type of work 2  State she lived in 2  Total score 25   Cranial nerves: CN I: not tested CN II: pupils equal, round and reactive to light, visual fields intact CN [name], IV, VI:  full range of motion, no nystagmus, no ptosis CN V: facial sensation intact CN VII: upper and lower face symmetric CN VIII: hearing intact CN IX, X: gag intact, uvula midline CN XI: sternocleidomastoid and trapezius muscles intact CN XII: tongue midline Bulk & Tone: normal, no fasciculations. Motor:  5/5 throughout Sensation:  Pinprick sensation intact; vibration sensation reduced in toes. Deep Tendon Reflexes:  2+ throughout, toes downgoing.   Finger to nose testing:  Without dysmetria.   Heel to shin:  Without dysmetria.   Gait:  Normal station and stride.  Able to turn, difficulty with tandem walk.  Romberg with  sway.  IMPRESSION: 1.  Memory deficits.  No significant cognitive impairment appreciated on exam and interview.  Mild difficulty following lectures may be part of normal aging.  Of course, people with advanced education may compensate for cognitive deficits.  We will check B12 but at this time, I favor monitoring for changes rather than proceeding with other further testing such as MRI of brain and neuropsychological testing. 2.  Balance  disorder.  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  Workup has been unremarkable but B12 never checked.  PLAN: 1.  Check B12 level.  Further recommendations pending results.  ADDENDUM:  B12 level normal at 438. 2.  Monitor changes in memory 3.  Otherwise, follow up as needed.  Thank you for allowing me to take part in the care of this patient.  [name] Clines, DO  CC:  Ermalinda Barrios, PA-C  Maury Dus, MD

## 2019-06-22 ENCOUNTER — Ambulatory Visit (INDEPENDENT_AMBULATORY_CARE_PROVIDER_SITE_OTHER): Payer: Medicare Other | Admitting: Neurology

## 2019-06-22 ENCOUNTER — Other Ambulatory Visit (INDEPENDENT_AMBULATORY_CARE_PROVIDER_SITE_OTHER): Payer: Medicare Other

## 2019-06-22 ENCOUNTER — Encounter: Payer: Self-pay | Admitting: Neurology

## 2019-06-22 ENCOUNTER — Other Ambulatory Visit: Payer: Self-pay

## 2019-06-22 ENCOUNTER — Telehealth: Payer: Self-pay

## 2019-06-22 VITALS — BP 137/69 | HR 73 | Ht 68.0 in | Wt 133.4 lb

## 2019-06-22 DIAGNOSIS — R200 Anesthesia of skin: Secondary | ICD-10-CM

## 2019-06-22 DIAGNOSIS — R413 Other amnesia: Secondary | ICD-10-CM

## 2019-06-22 DIAGNOSIS — G609 Hereditary and idiopathic neuropathy, unspecified: Secondary | ICD-10-CM

## 2019-06-22 DIAGNOSIS — I2510 Atherosclerotic heart disease of native coronary artery without angina pectoris: Secondary | ICD-10-CM

## 2019-06-22 LAB — VITAMIN B12: Vitamin B-12: 438 pg/mL (ref 211–911)

## 2019-06-22 NOTE — Telephone Encounter (Signed)
-----   Message from Pieter Partridge, DO sent at 06/22/2019 12:48 PM EDT ----- B12 level is okay

## 2019-06-22 NOTE — Patient Instructions (Addendum)
I don't appreciate any cognitive impairment on exam. Balance problems are due to neuropathy in feet We will check a B12 level.  Further recommendations pending results.  Otherwise, follow up as needed. Your provider has requested that you have labwork completed today. Please go to [facility] ([id number]) on the second floor of this building before leaving the office today. You do not need to check in. If you are not called within 15 minutes please check with the front desk.

## 2019-06-22 NOTE — Telephone Encounter (Signed)
B12 Results given to pt.

## 2019-06-24 DIAGNOSIS — H26492 Other secondary cataract, left eye: Secondary | ICD-10-CM | POA: Diagnosis not present

## 2019-06-30 ENCOUNTER — Ambulatory Visit
Admission: RE | Admit: 2019-06-30 | Discharge: 2019-06-30 | Disposition: A | Discharge status: Home / Self Care | Payer: Medicare Other | Source: Ambulatory Visit | Attending: Internal Medicine | Admitting: Internal Medicine

## 2019-06-30 DIAGNOSIS — R918 Other nonspecific abnormal finding of lung field: Secondary | ICD-10-CM

## 2019-07-13 ENCOUNTER — Other Ambulatory Visit (HOSPITAL_COMMUNITY)
Admission: RE | Admit: 2019-07-13 | Discharge: 2019-07-13 | Disposition: A | Discharge status: Home / Self Care | Payer: Medicare Other | Source: Ambulatory Visit | Attending: Internal Medicine | Admitting: Internal Medicine

## 2019-07-13 DIAGNOSIS — Z01812 Encounter for preprocedural laboratory examination: Secondary | ICD-10-CM | POA: Diagnosis present

## 2019-07-13 DIAGNOSIS — Z20822 Contact with and (suspected) exposure to covid-19: Secondary | ICD-10-CM | POA: Diagnosis not present

## 2019-07-13 LAB — SARS CORONAVIRUS 2 (TAT 6-24 HRS): SARS Coronavirus 2: NEGATIVE

## 2019-07-16 ENCOUNTER — Other Ambulatory Visit: Payer: Self-pay

## 2019-07-16 ENCOUNTER — Ambulatory Visit (INDEPENDENT_AMBULATORY_CARE_PROVIDER_SITE_OTHER): Payer: Medicare Other | Admitting: Internal Medicine

## 2019-07-16 ENCOUNTER — Other Ambulatory Visit: Payer: Medicare Other

## 2019-07-16 DIAGNOSIS — J4540 Moderate persistent asthma, uncomplicated: Secondary | ICD-10-CM | POA: Diagnosis not present

## 2019-07-16 LAB — PULMONARY FUNCTION TEST
DL/VA % pred: 97 %
DL/VA: 3.78 ml/min/mmHg/L
DLCO cor % pred: 78 %
DLCO cor: 18.43 ml/min/mmHg
DLCO unc % pred: 78 %
DLCO unc: 18.43 ml/min/mmHg
FEF 25-75 Post: 2.04 L/sec
FEF 25-75 Pre: 2.36 L/sec
FEF2575-%Change-Post: -13 %
FEF2575-%Pred-Post: 111 %
FEF2575-%Pred-Pre: 128 %
FEV1-%Change-Post: 0 %
FEV1-%Pred-Post: 94 %
FEV1-%Pred-Pre: 94 %
FEV1-Post: 2.55 L
FEV1-Pre: 2.54 L
FEV1FVC-%Change-Post: -2 %
FEV1FVC-%Pred-Pre: 110 %
FEV6-%Change-Post: 4 %
FEV6-%Pred-Post: 93 %
FEV6-%Pred-Pre: 89 %
FEV6-Post: 3.33 L
FEV6-Pre: 3.19 L
FEV6FVC-%Pred-Post: 107 %
FEV6FVC-%Pred-Pre: 107 %
FVC-%Change-Post: 3 %
FVC-%Pred-Post: 87 %
FVC-%Pred-Pre: 84 %
FVC-Post: 3.33 L
FVC-Pre: 3.22 L
Post FEV1/FVC ratio: 77 %
Post FEV6/FVC ratio: 100 %
Pre FEV1/FVC ratio: 79 %
Pre FEV6/FVC Ratio: 100 %

## 2019-07-16 NOTE — Progress Notes (Addendum)
[name] [name] pre/post/DLCO performed today.

## 2019-08-18 DIAGNOSIS — Z111 Encounter for screening for respiratory tuberculosis: Secondary | ICD-10-CM | POA: Diagnosis not present

## 2019-08-23 ENCOUNTER — Other Ambulatory Visit: Payer: Self-pay

## 2019-08-23 ENCOUNTER — Ambulatory Visit (INDEPENDENT_AMBULATORY_CARE_PROVIDER_SITE_OTHER): Payer: Medicare Other | Admitting: Internal Medicine

## 2019-08-23 ENCOUNTER — Encounter: Payer: Self-pay | Admitting: Internal Medicine

## 2019-08-23 VITALS — BP 112/52 | HR 77 | Ht 69.5 in | Wt 127.4 lb

## 2019-08-23 DIAGNOSIS — I472 Ventricular tachycardia: Secondary | ICD-10-CM

## 2019-08-23 DIAGNOSIS — I2510 Atherosclerotic heart disease of native coronary artery without angina pectoris: Secondary | ICD-10-CM

## 2019-08-23 DIAGNOSIS — I4729 Other ventricular tachycardia: Secondary | ICD-10-CM

## 2019-08-23 DIAGNOSIS — I10 Essential (primary) hypertension: Secondary | ICD-10-CM | POA: Diagnosis not present

## 2019-08-23 DIAGNOSIS — I483 Typical atrial flutter: Secondary | ICD-10-CM | POA: Diagnosis not present

## 2019-08-23 DIAGNOSIS — R001 Bradycardia, unspecified: Secondary | ICD-10-CM | POA: Diagnosis not present

## 2019-08-23 DIAGNOSIS — Z952 Presence of prosthetic heart valve: Secondary | ICD-10-CM

## 2019-08-23 NOTE — Progress Notes (Signed)
PCP: Maury Dus, MD Primary Cardiologist: Dr Radford Pax Primary EP: Dr Rayann Heman  [name] [name] is a 84 y.o. male who presents today for routine electrophysiology followup.  Since last being seen in our clinic, the patient reports doing very well.  He is active for his age.  He is moving to independent living in [place] to be near family.  He has been in [place] for 46 years.  Today, he denies symptoms of palpitations, chest pain, shortness of breath,  lower extremity edema, dizziness, presyncope, or syncope.  The patient is otherwise without complaint today.   Past Medical History:  Diagnosis Date   Actinic keratoses    Dr Glee Arvin   Allergic rhinitis    Aortic stenosis    Bicuspid AV with severe AS s/p TAVR 06/2016   Arthritis of left hip 11/03/2014   Benign essential HTN    Bilateral cataracts    Bladder diverticulum 06/14/2016   BPH (benign prostatic hypertrophy)    Chronic diastolic CHF (congestive heart failure) ([place]) 10/16/2017   Coronary artery disease involving native coronary artery of native heart without angina pectoris    PCI 06/06/16 LAD proximal 60 (FFR 0.85-negative); RI 50; LCx calcified without significant stenosis; RCA mid 80 (FFR 0.68-hemodynamically significant); PCI: 2.25 x 16 mm Synergy DES to the mid RCA   Dilated aortic root ([place])    67mm by echo 08/2015   Glaucoma    H/O seasonal allergies    Hx of adenomatous colonic polyps 1991 on   Hyperlipidemia    Mitral valve prolapse    mild MR by echo 08/2015   Multifocal pneumonia 12/01/2016   OA (osteoarthritis) of hip 10/16/2016   PAF (paroxysmal atrial fibrillation) (HCC)    S/P DCCV 10/2017   Paresthesia    Chronic left lateral thigh   Pericardial effusion 11/05/2016   Postural dizziness with presyncope 04/23/2017   Pulmonary hypertension, moderate to severe (HCC)    moderate by right heart cath 02/2017   Pulmonary nodules    Past Surgical History:  Procedure Laterality Date    CARDIOVERSION N/A 10/27/2017   Procedure: CARDIOVERSION;  Surgeon: Josue Hector, MD;  Location: [facility] ENDOSCOPY;  Service: Cardiovascular;  Laterality: N/A;   COLONOSCOPY  multiple   CORONARY STENT INTERVENTION N/A 06/06/2016   Procedure: Coronary Stent Intervention;  Surgeon: [name], MD;  Location: [place] CV LAB;  Service: Cardiovascular;  Laterality: N/A;   CYSTOSCOPY  08/12/2016   per patient; showed a cyst on bladder , liver , and kidney , sees urologist Matilde Bash III at [place]     Bilateral cataract removal   INTRAVASCULAR PRESSURE WIRE/FFR STUDY N/A 05/29/2016   Procedure: Intravascular Pressure Wire/FFR Study;  Surgeon: [name], MD;  Location: [place] CV LAB;  Service: Cardiovascular;  Laterality: N/A;   IR THORACENTESIS ASP PLEURAL SPACE W/IMG GUIDE  05/28/2017   RIGHT/LEFT HEART CATH AND CORONARY ANGIOGRAPHY N/A 05/29/2016   Procedure: Right/Left Heart Cath and Coronary Angiography;  Surgeon: [name], MD;  Location: [place] CV LAB;  Service: Cardiovascular;  Laterality: N/A;   RIGHT/LEFT HEART CATH AND CORONARY ANGIOGRAPHY N/A 06/06/2017   Procedure: RIGHT/LEFT HEART CATH AND CORONARY ANGIOGRAPHY;  Surgeon: Larey Dresser, MD;  Location: [place] CV LAB;  Service: Cardiovascular;  Laterality: N/A;   TEE WITHOUT CARDIOVERSION N/A 07/02/2016   Procedure: TRANSESOPHAGEAL ECHOCARDIOGRAM (TEE);  Surgeon: [name], MD;  Location: [place];  Service: Open Heart Surgery;  Laterality: N/A;  TONSILLECTOMY  1949   TOTAL HIP ARTHROPLASTY Left 10/16/2016   Procedure: LEFT TOTAL HIP ARTHROPLASTY ANTERIOR APPROACH;  Surgeon: Gaynelle Arabian, MD;  [facility];  Service: Orthopedics;  Laterality: Left;   TRANSCATHETER AORTIC VALVE REPLACEMENT, TRANSFEMORAL N/A 07/02/2016   Procedure: TRANSCATHETER AORTIC VALVE REPLACEMENT, TRANSFEMORAL;  Surgeon: [name], MD;  Location: [place];  Service: Open Heart Surgery;  Laterality:  N/A;    ROS- all systems are reviewed and negatives except as per HPI above  Current Outpatient Medications  Medication Sig Dispense Refill   augmented betamethasone dipropionate (DIPROLENE-AF) 0.05 % cream Apply 1 application topically 2 (two) times daily.     ELIQUIS 2.5 MG TABS tablet TAKE 1 TABLET BY MOUTH TWICE DAILY. 180 tablet 1   fluticasone (FLONASE) 50 MCG/ACT nasal spray Place 1 spray into both nostrils at bedtime as needed for allergies.      Multiple Vitamins-Minerals (PRESERVISION AREDS 2 PO) Take 2 tablets by mouth daily.     pravastatin (PRAVACHOL) 40 MG tablet Take 40 mg by mouth daily.     UNABLE TO FIND Place 1 drop into both eyes at bedtime. Med Name: Rhopresso (netorsudil ophthalmic solution)     UNABLE TO FIND Place 3 drops into both eyes daily. Med Name: Alcon Azorp (brinsalamide ophthalmic solution)     No current facility-administered medications for this visit.    Physical Exam: Vitals:   08/23/19 1500  BP: (!) 112/52  Pulse: 77  SpO2: 95%  Weight: 127 lb 6.4 oz (57.8 kg)  Height: 5' 9.5" (1.765 m)  GEN- The patient is elderly and thin appearing, alert and oriented x 3 today.   Head- normocephalic, atraumatic Eyes-  Sclera clear, conjunctiva pink Ears- hearing intact Oropharynx- clear Lungs-   normal work of breathing Heart- Regular rate and rhythm  GI- soft  Extremities- no clubbing, cyanosis, or edema  Wt Readings from Last 3 Encounters:  08/23/19 127 lb 6.4 oz (57.8 kg)  06/22/19 133 lb 6.4 oz (60.5 kg)  05/12/19 132 lb (59.9 kg)    EKG tracing ordered today is personally reviewed and shows sinus with PVCs, poor r wave progression, inferior infarct pattern  Assessment and Plan:  1. Atrial flutter Previously declined atrial flutter ablation He carries a diagnosis of afib also, however I do not see this well documented Minimal symptoms and has been very well controlled with conservative measures He is on eliqus  2. Sinus  bradycardia Asymptomatic No indication for pacing  3. NSVT Stable No change required today  4. S/p TAVR for bicuspid AV and severe AS in 2018 Doing well  5. HTN Stable No change required today  6. CAD No ischemic symptoms  Risks, benefits and potential toxicities for medications prescribed and/or refilled reviewed with patient today.   Moving to [place] I have advised that he establish with cardiology there I will see as needed going forward  Thompson Grayer MD, [facility] 08/23/2019 3:07 PM

## 2019-08-23 NOTE — Patient Instructions (Addendum)
Medication Instructions:  Your physician recommends that you continue on your current medications as directed. Please refer to the Current Medication list given to you today.  *If you need a refill on your cardiac medications before your next appointment, please call your pharmacy*  Lab Work: None ordered.  If you have labs (blood work) drawn today and your tests are completely normal, you will receive your results only by:  [place] (if you have MyChart) OR  A paper copy in the mail If you have any lab test that is abnormal or we need to change your treatment, we will call you to review the results.  Testing/Procedures: None ordered.  Follow-Up: At [facility], you and your health needs are our priority.  As part of our continuing mission to provide you with exceptional heart care, we have created designated Provider Care Teams.  These Care Teams include your primary Cardiologist (physician) and Advanced Practice Providers (APPs -  Physician Assistants and Nurse Practitioners) who all work together to provide you with the care you need, when you need it.  We recommend signing up for the patient portal called "MyChart".  Sign up information is provided on this After Visit Summary.  MyChart is used to connect with patients for Virtual Visits (Telemedicine).  Patients are able to view lab/test results, encounter notes, upcoming appointments, etc.  Non-urgent messages can be sent to your provider as well.   To learn more about what you can do with MyChart, go to [email or web address].    Your next appointment:   Your physician wants you to follow-up in: As needed with Dr. Rayann Heman. You will receive a reminder letter in the mail two months in advance. If you don't receive a letter, please call our office to schedule the follow-up appointment.   Other Instructions:

## 2019-08-29 ENCOUNTER — Telehealth: Payer: Self-pay | Admitting: Internal Medicine

## 2019-08-29 NOTE — Telephone Encounter (Signed)
Last seen end of April 2021. Advised ct chest end June 2021 which is clear. Supposed to have PFT and feno and then see me - do not see that on schedule. Please facilitate - first avail is fine

## 2019-09-01 NOTE — Telephone Encounter (Signed)
Called and spoke with pt letting him know the info stated by MR and to see if I could get him scheduled for an appt with MR. Pt stated that he is about to move to [place]. Nothing further needed.

## 2019-10-18 DIAGNOSIS — Z23 Encounter for immunization: Secondary | ICD-10-CM | POA: Diagnosis not present

## 2019-10-26 DIAGNOSIS — Z961 Presence of intraocular lens: Secondary | ICD-10-CM | POA: Diagnosis not present

## 2019-10-26 DIAGNOSIS — H353131 Nonexudative age-related macular degeneration, bilateral, early dry stage: Secondary | ICD-10-CM | POA: Diagnosis not present

## 2019-10-26 DIAGNOSIS — H401131 Primary open-angle glaucoma, bilateral, mild stage: Secondary | ICD-10-CM | POA: Diagnosis not present

## 2019-10-26 DIAGNOSIS — H43813 Vitreous degeneration, bilateral: Secondary | ICD-10-CM | POA: Diagnosis not present

## 2019-10-26 DIAGNOSIS — H26491 Other secondary cataract, right eye: Secondary | ICD-10-CM | POA: Diagnosis not present

## 2019-11-09 DIAGNOSIS — Z23 Encounter for immunization: Secondary | ICD-10-CM | POA: Diagnosis not present

## 2019-12-07 DIAGNOSIS — Z954 Presence of other heart-valve replacement: Secondary | ICD-10-CM | POA: Diagnosis not present

## 2019-12-07 DIAGNOSIS — J309 Allergic rhinitis, unspecified: Secondary | ICD-10-CM | POA: Diagnosis not present

## 2019-12-07 DIAGNOSIS — N400 Enlarged prostate without lower urinary tract symptoms: Secondary | ICD-10-CM | POA: Diagnosis not present

## 2019-12-07 DIAGNOSIS — E782 Mixed hyperlipidemia: Secondary | ICD-10-CM | POA: Diagnosis not present

## 2020-04-06 IMAGING — CT CT CHEST HIGH RESOLUTION W/O CM
2 of 5 series · 14 of 36 positions shown, 17 images · non-contrast
Comparison: Chest CT 05/22/2017.

CLINICAL DATA: 81-year-old male with history of chronic fatigue and
weight loss. Shortness of breath on exertion. Evaluate for
interstitial lung disease.

EXAM:
CT CHEST WITHOUT CONTRAST
TECHNIQUE: Multidetector CT imaging of the chest was performed following the
standard protocol without intravenous contrast. High resolution
imaging of the lungs, as well as inspiratory and expiratory imaging,
was performed.

[Series 2: high resolution · axial · 0.64mm/px · z∈[-363,-35]mm · 11 of 182 slices shown, 14 images]
[im 9/182  mediastinal]
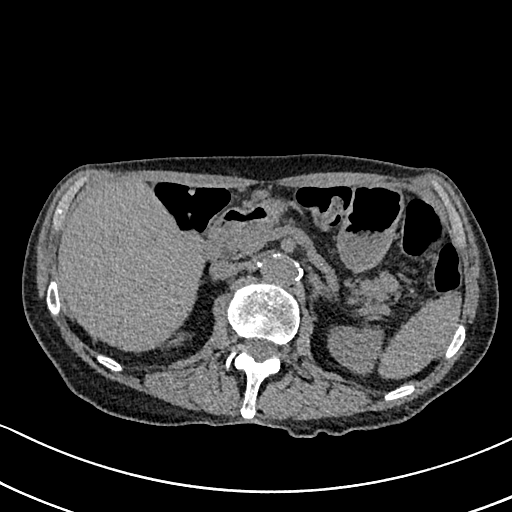
[im 9/182  lung]
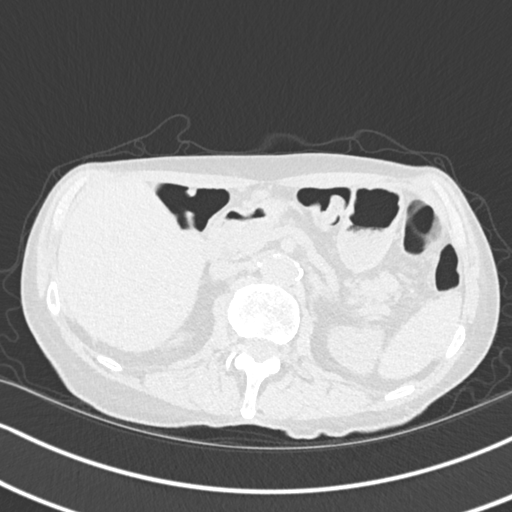
[im 25/182  lung]
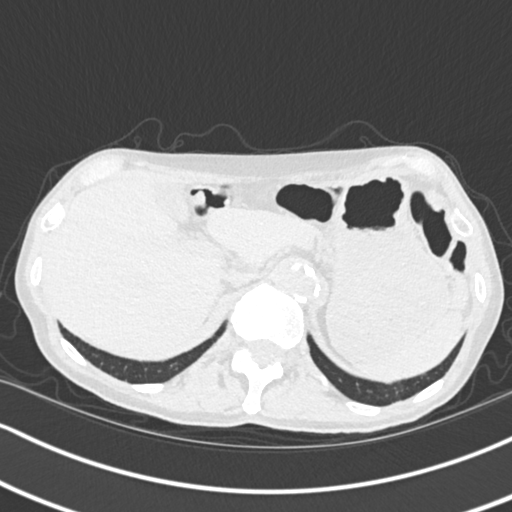
[im 42/182  lung]
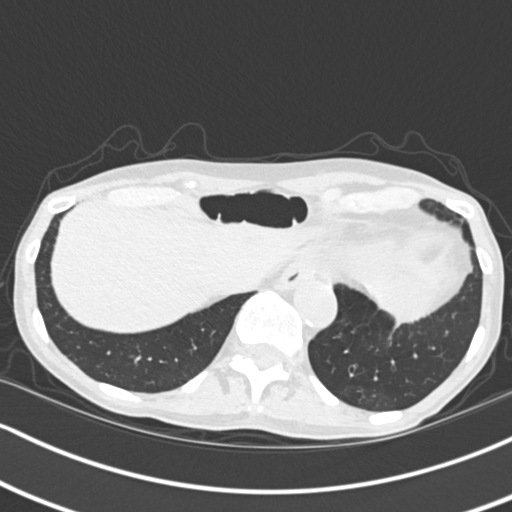
[im 58/182  lung]
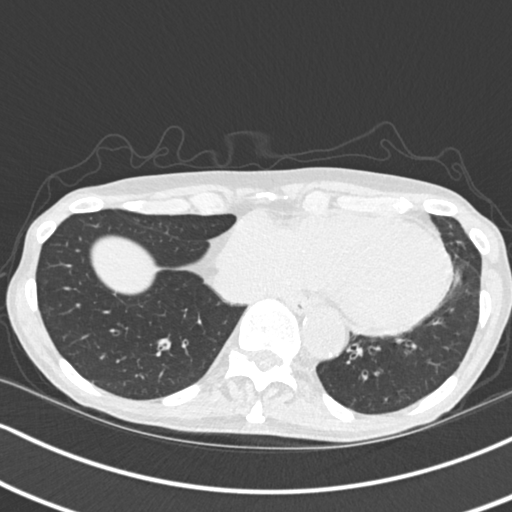
[im 75/182  mediastinal]
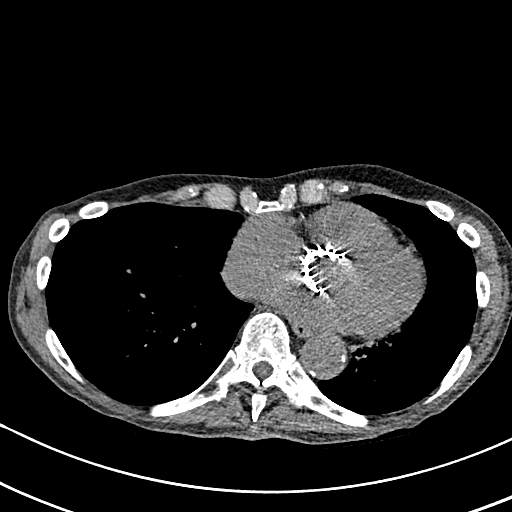
[im 75/182  lung]
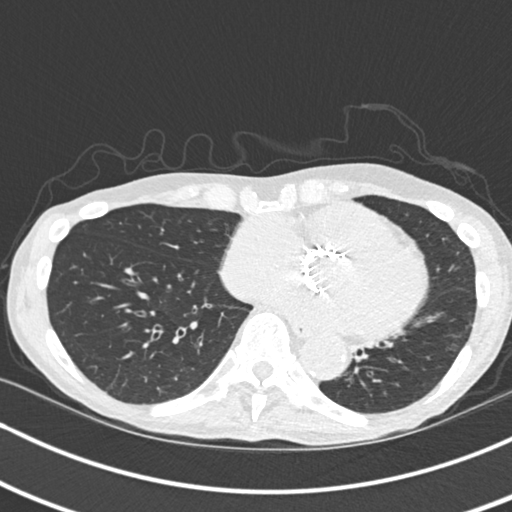
[im 91/182  lung]
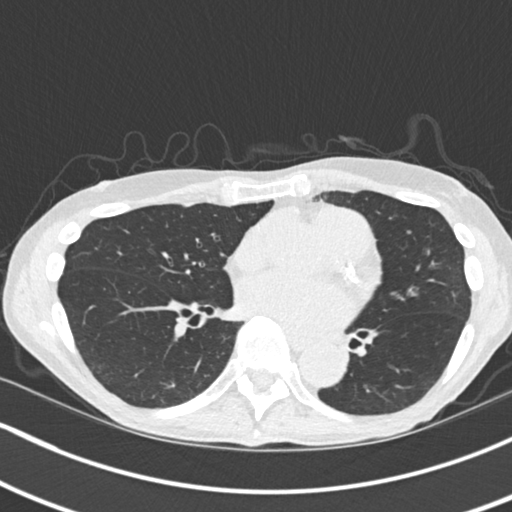
[im 107/182  lung]
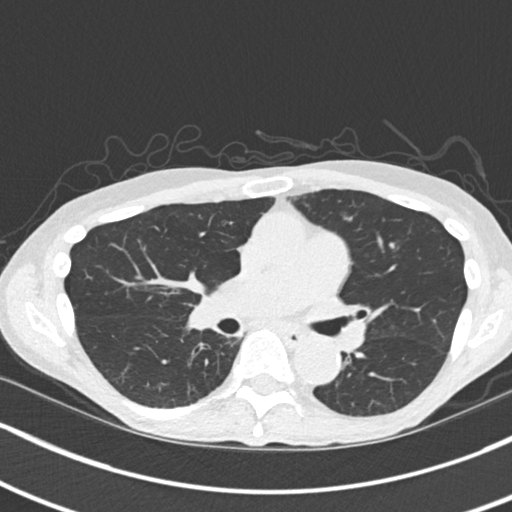
[im 124/182  lung]
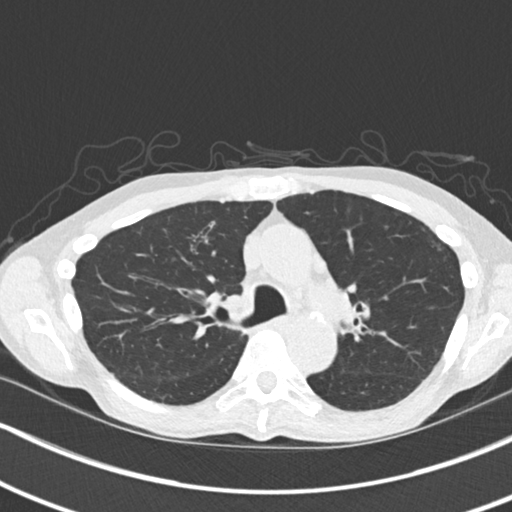
[im 140/182  mediastinal]
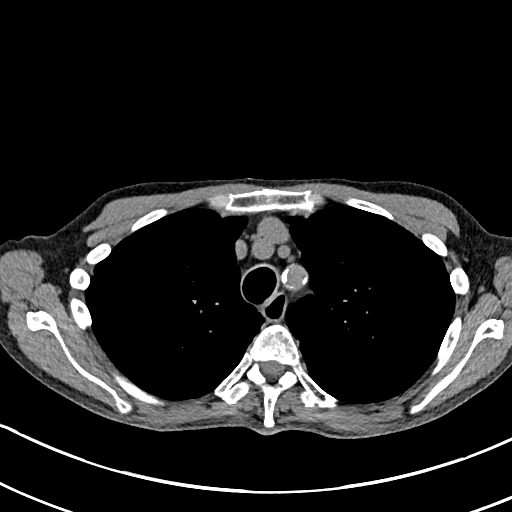
[im 140/182  lung]
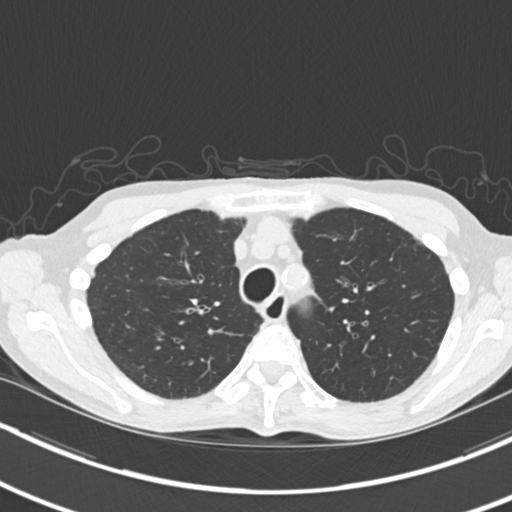
[im 157/182  lung]
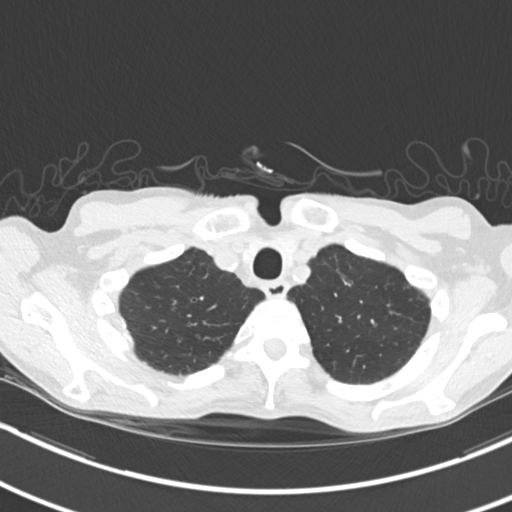
[im 173/182  lung]
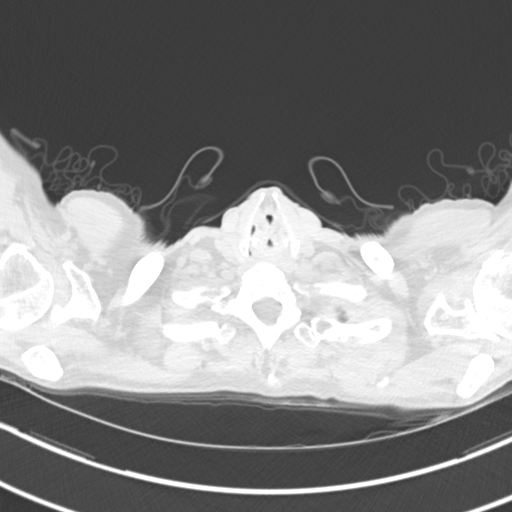

[Series 9: coronal · coronal · 0.64mm/px · 3 of 91 slices shown]
[im 19/91  lung]
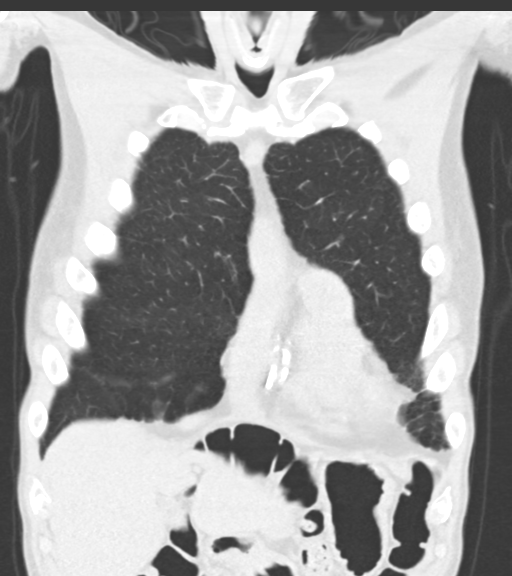
[im 37/91  lung]
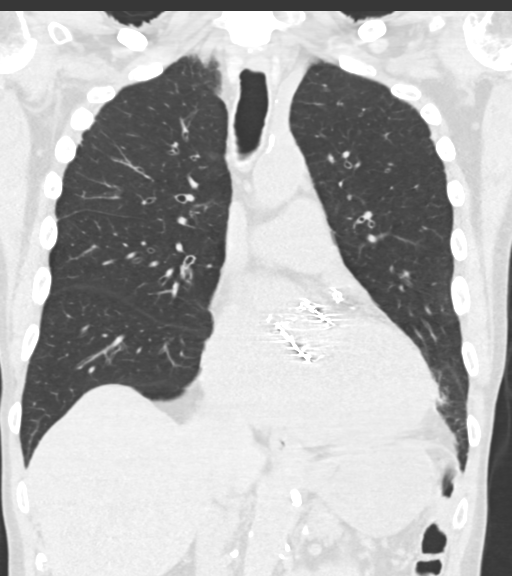
[im 55/91  lung]
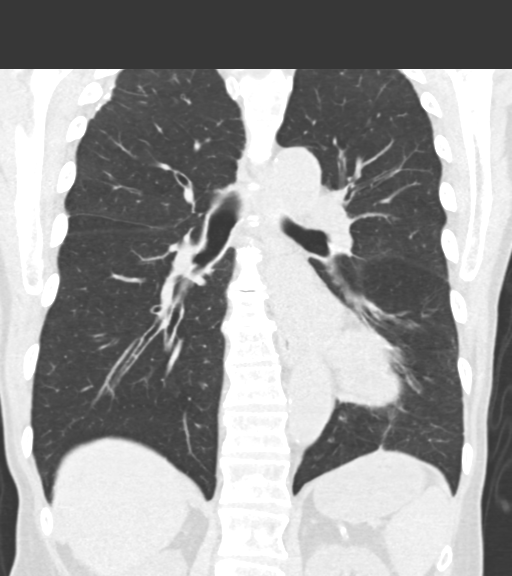

[14 of 36 positions shown; findings below may reference images not displayed]

FINDINGS: Cardiovascular: Heart size is normal. There is no significant
pericardial fluid, thickening or pericardial calcification. There is
aortic atherosclerosis, as well as atherosclerosis of the great
vessels of the mediastinum and the coronary arteries, including
calcified atherosclerotic plaque in the left main, left anterior
descending, left circumflex and right coronary arteries. Implanted
transcatheter aortic valve incidentally noted.

Mediastinum/Nodes: No pathologically enlarged mediastinal or hilar
lymph nodes. Please note that accurate exclusion of hilar adenopathy
is limited on noncontrast CT scans. Esophagus is unremarkable in
appearance. No axillary lymphadenopathy.

Lungs/Pleura: High-resolution images demonstrate no significant
regions of ground-glass attenuation, subpleural reticulation,
parenchymal banding or honeycombing. Mild diffuse bronchial wall
thickening. A few scattered areas of mild cylindrical bronchiectasis
are noted. Inspiratory and expiratory imaging is unremarkable. No
acute consolidative airspace disease. No pleural effusions. Several
tiny 1-2 mm pulmonary nodules are noted throughout the periphery of
the lungs bilaterally, nonspecific, but most compatible with areas
of chronic mucoid impaction within terminal bronchioles. No larger
more suspicious appearing pulmonary nodules or masses are noted.

Upper Abdomen: 1 cm low-attenuation lesion in segment 2 of the
liver, incompletely characterized on today's noncontrast CT
examination. Aortic atherosclerosis.

Musculoskeletal: There are no aggressive appearing lytic or blastic
lesions noted in the visualized portions of the skeleton.
IMPRESSION: 1. No findings to suggest interstitial lung disease.
2. Mild cylindrical bronchiectasis.
3. Multiple tiny 1-2 mm pulmonary nodules noted throughout the lungs
bilaterally, nonspecific, but statistically benign, likely areas of
mucoid impaction within terminal bronchioles. No follow-up needed if
patient is low-risk (and has no known or suspected primary
neoplasm). Non-contrast chest CT can be considered in 12 months if
patient is high-risk. This recommendation follows the consensus
statement: Guidelines for Management of Incidental Pulmonary Nodules
Detected on CT Images: From the [HOSPITAL] 3290; Radiology
3290; [DATE].

Aortic Atherosclerosis (B841H-LPN.N).
